# Patient Record
Sex: Male | Born: 1960 | Race: Black or African American | Hispanic: No | Marital: Married | Smoking: Former smoker
Health system: Southern US, Community
[De-identification: ages and names within clinical notes are randomized; demographics above are authoritative.]

## PROBLEM LIST (undated history)

## (undated) DIAGNOSIS — R011 Cardiac murmur, unspecified: Secondary | ICD-10-CM

## (undated) DIAGNOSIS — M1990 Unspecified osteoarthritis, unspecified site: Secondary | ICD-10-CM

---

## 1997-10-11 ENCOUNTER — Encounter: Admission: RE | Admit: 1997-10-11 | Discharge: 1997-10-11 | Payer: Self-pay | Admitting: *Deleted

## 2004-08-29 ENCOUNTER — Emergency Department (HOSPITAL_COMMUNITY): Admission: EM | Admit: 2004-08-29 | Discharge: 2004-08-29 | Payer: Self-pay | Admitting: Family Medicine

## 2014-06-14 ENCOUNTER — Ambulatory Visit (INDEPENDENT_AMBULATORY_CARE_PROVIDER_SITE_OTHER): Payer: BLUE CROSS/BLUE SHIELD | Admitting: Family Medicine

## 2014-06-14 ENCOUNTER — Ambulatory Visit (INDEPENDENT_AMBULATORY_CARE_PROVIDER_SITE_OTHER): Payer: BLUE CROSS/BLUE SHIELD

## 2014-06-14 VITALS — BP 140/98 | HR 70 | Temp 97.7°F | Resp 16 | Ht 72.0 in | Wt 219.6 lb

## 2014-06-14 DIAGNOSIS — M7061 Trochanteric bursitis, right hip: Secondary | ICD-10-CM

## 2014-06-14 DIAGNOSIS — M25551 Pain in right hip: Secondary | ICD-10-CM

## 2014-06-14 DIAGNOSIS — M25552 Pain in left hip: Secondary | ICD-10-CM

## 2014-06-14 DIAGNOSIS — M4125 Other idiopathic scoliosis, thoracolumbar region: Secondary | ICD-10-CM

## 2014-06-14 DIAGNOSIS — M5441 Lumbago with sciatica, right side: Secondary | ICD-10-CM

## 2014-06-14 MED ORDER — TRAMADOL HCL 50 MG PO TABS: 50.0000 mg | ORAL_TABLET | Freq: Three times a day (TID) | ORAL | Status: DC | PRN

## 2014-06-14 MED ORDER — METHYLPREDNISOLONE ACETATE 80 MG/ML IJ SUSP
40.0000 mg | Freq: Once | INTRAMUSCULAR | Status: AC
  Administered 2014-06-14: 40 mg via INTRAMUSCULAR

## 2014-06-14 MED ORDER — CYCLOBENZAPRINE HCL 10 MG PO TABS: 10.0000 mg | ORAL_TABLET | Freq: Two times a day (BID) | ORAL | Status: DC | PRN

## 2014-06-14 NOTE — Patient Instructions (Signed)
Use the flexeril and/ or tramadol for pain in your back.  The flexeril is a muscle relaxer and tramadol is a mild narcotic pain medication.  Avoid using both at the same time unless absolutely necessary for pain- remember that these medications can make you feel sleepy.   If you would like to go ahead with seeing a specialist for your back just let me know.

## 2014-06-14 NOTE — Progress Notes (Signed)
[facility] [address] [place] [address] [phone]  Date:  06/14/2014   Name:  Riley Wright   DOB:  [birth year]   MRN:  [id number]  PCP:  No PCP Per Patient    Chief Complaint: Back Pain   History of Present Illness:  Riley Wright is a 54 y.o. 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   He has a history of scoliosis.  Sometimes his back can be pretty stiff in the am.   When he was pushing the items onto his truck, he twisted his back and had sudden onset of pain.   He has noted some pain and numbness into his right leg coming and going- this is more troublesome when he is laying down in bed.   This has been present off and on for about 2 months. He does not note any weakness in his legs.   No trouble with bowel or bladder control.  No history of back surgery.    He has had x-rays but never an MRI that he can recall.   OTC he has tried some medications for back pain.   There are no active problems to display for this patient.   History reviewed. No pertinent past medical history.  History reviewed. No pertinent past surgical history.  History  Substance Use Topics  . Smoking status: Current Some Day Smoker  . Smokeless tobacco: Not on file  . Alcohol Use: 1.2 oz/week    2 Standard drinks or equivalent per week    Family History  Problem Relation Age of Onset  . Stroke Mother   . Hypertension Mother   . Hypertension Father     Not on File  Medication list has been reviewed and updated.  No current outpatient prescriptions on file prior to visit.   No current facility-administered medications on file prior to visit.    Review of Systems:  As per HPI- otherwise negative.   Physical Examination: Filed Vitals:   06/14/14 1429  BP: 150/108  Pulse: 70  Temp: 97.7 F (36.5 C)  Resp: 16    Filed Vitals:   06/14/14 1429  Height: 6' (1.829 m)  Weight: 219 lb 9.6 oz (99.61 kg)   Body mass index is 29.78 kg/(m^2). Ideal Body Weight: Weight in (lb) to have BMI = 25: 183.9  GEN: WDWN, NAD, Non-toxic, A & O x 3 HEENT: Atraumatic, Normocephalic. Neck supple. No masses, No LAD. Ears and Nose: No external deformity. CV: RRR, No M/G/R. No JVD. No thrill. No extra heart sounds. PULM: CTA B, no wheezes, crackles, rhonchi. No retractions. No resp. distress. No accessory muscle use. ABD: S, NT, ND, +BS. No rebound. No HSM. EXTR: No c/c/e NEURO Normal gait.  PSYCH: Normally interactive. Conversant. Not depressed or anxious appearing.  Calm demeanor. ;  [facility] reading (PRIMARY) by  Dr. Patsy Lager. Thoracic spine: significant thoracolumbar scoliosis Lumbar spine: significant thoracolumbar scoliosis, degenerative change  Right hip:   Mild degenerative change Left hip: mild degenerative    VC obtained,  Prepped area over right greater trochanter with betadine and alcohol.  Anesthesia with ethyl chloride spray.  Injected 3ml of 1% lidocaine and  of depo- medrol into the greater trochanteric bursa  Assessment and Plan: Other idiopathic scoliosis, thoracolumbar region - Plan: DG Lumbar Spine Complete, DG  Thoracic Spine 2 View  Bilateral hip pain - Plan: DG HIP UNILAT WITH PELVIS 1V RIGHT, DG HIP UNILAT WITH PELVIS 1V LEFT  Right-sided low back pain with right-sided sciatica - Plan: cyclobenzaprine (FLEXERIL) 10 MG tablet, traMADol (ULTRAM) 50 MG tablet  Trochanteric bursitis of right hip - Plan: methylPREDNISolone acetate (DEPO-MEDROL) injection 40 mg  He noted some relief of his right hip pain following his injection.  Given rx tramadol and flexeril to use as needed for pain- cautioned regarding sedation.  He will let me know if he wants to go ahead with seeing a spine specialist- he declines for now. He is thinking about taking up a different line of work that will be less stressful  on his back   Encouraged him to stay active and to let me know if his back is not feeling better son He will also keep an eye on his BP and follow-up regarding this soon.    Signed Abbe AmsterdamJessica Copland, MD

## 2014-06-18 ENCOUNTER — Telehealth: Payer: Self-pay | Admitting: Family Medicine

## 2014-06-18 ENCOUNTER — Telehealth: Payer: Self-pay

## 2014-06-18 DIAGNOSIS — M4125 Other idiopathic scoliosis, thoracolumbar region: Secondary | ICD-10-CM

## 2014-06-18 NOTE — Telephone Encounter (Signed)
Dr. Patsy Lageropland  Patient would a referral to a back specialist.    [phone]

## 2014-06-18 NOTE — Telephone Encounter (Signed)
Please let him know that this referral is made

## 2014-06-18 NOTE — Telephone Encounter (Signed)
Spoke with patient i let him know that Dr Patsy Lageropland made his referral and someone will give him a call later about it

## 2014-06-18 NOTE — Telephone Encounter (Signed)
Other idiopathic scoliosis, thoracolumbar region - Plan: DG Lumbar Spine Complete, DG Thoracic Spine 2 View  Bilateral hip pain - Plan: DG HIP UNILAT WITH PELVIS 1V RIGHT, DG HIP UNILAT WITH PELVIS 1V LEFT  Right-sided low back pain with right-sided sciatica - Plan: cyclobenzaprine (FLEXERIL) 10 MG tablet, traMADol (ULTRAM) 50 MG tablet  Trochanteric bursitis of right hip - Plan: methylPREDNISolone acetate (DEPO-MEDROL) injection 40 mg  He noted some relief of his right hip pain following his injection. Given rx tramadol and flexeril to use as needed for pain- cautioned regarding sedation. He will let me know if he wants to go ahead with seeing a spine specialist- he declines for now. He is thinking about taking up a different line of work that will be less stressful on his back  Encouraged him to stay active and to let me know if his back is not feeling better son He will also keep an eye on his BP and follow-up regarding this soon.   Can I place referral?

## 2014-07-20 ENCOUNTER — Telehealth: Payer: Self-pay | Admitting: Radiology

## 2014-07-20 NOTE — Telephone Encounter (Signed)
Neurosurgeon office called, patient declined appointment.  He stated he was seeing his W/C doctor.

## 2015-01-06 ENCOUNTER — Other Ambulatory Visit: Payer: Self-pay | Admitting: Surgical

## 2015-01-19 NOTE — Patient Instructions (Signed)
Riley Wright  01/19/2015   Your procedure is scheduled on: Wednesday 01/26/2015  Report to [facility]  Entrance take [facility]  elevators to 3rd floor to  Short Stay Center at  0930  AM.  Call this number if you have problems the morning of surgery [phone]   Remember: ONLY 1 PERSON MAY GO WITH YOU TO SHORT STAY TO GET  READY MORNING OF YOUR SURGERY.  Do not eat food or drink liquids :After Midnight.     Take these medicines the morning of surgery with A SIP OF WATER: TRAMADOL IF NEEDED  DO NOT TAKE ANY DIABETIC MEDICATIONS DAY OF YOUR SURGERY                               You may not have any metal on your body including hair pins and              piercings  Do not wear jewelry, make-up, lotions, powders or perfumes, deodorant             Do not wear nail polish.  Do not shave  48 hours prior to surgery.              Men may shave face and neck.   Do not bring valuables to the hospital. [place] IS NOT             RESPONSIBLE   FOR VALUABLES.  Contacts, dentures or bridgework may not be worn into surgery.  Leave suitcase in the car. After surgery it may be brought to your room.     Patients discharged the day of surgery will not be allowed to drive home.  Name and phone number of your driver:  Special Instructions: N/A              Please read over the following fact sheets you were given: _____________________________________________________________________             [facility] - Preparing for Surgery Before surgery, you can play an important role.  Because skin is not sterile, your skin needs to be as free of germs as possible.  You can reduce the number of germs on your skin by washing with CHG (chlorahexidine gluconate) soap before surgery.  CHG is an antiseptic cleaner which kills germs and bonds with the skin to continue killing germs even after washing. Please DO NOT use if you have an allergy to CHG or antibacterial soaps.  If your  skin becomes reddened/irritated stop using the CHG and inform your nurse when you arrive at Short Stay. Do not shave (including legs and underarms) for at least 48 hours prior to the first CHG shower.  You may shave your face/neck. Please follow these instructions carefully:  1.  Shower with CHG Soap the night before surgery and the  morning of Surgery.  2.  If you choose to wash your hair, wash your hair first as usual with your  normal  shampoo.  3.  After you shampoo, rinse your hair and body thoroughly to remove the  shampoo.                           4.  Use CHG as you would any other liquid soap.  You can apply chg directly  to the  skin and wash                       Gently with a scrungie or clean washcloth.  5.  Apply the CHG Soap to your body ONLY FROM THE NECK DOWN.   Do not use on face/ open                           Wound or open sores. Avoid contact with eyes, ears mouth and genitals (private parts).                       Wash face,  Genitals (private parts) with your normal soap.             6.  Wash thoroughly, paying special attention to the area where your surgery  will be performed.  7.  Thoroughly rinse your body with warm water from the neck down.  8.  DO NOT shower/wash with your normal soap after using and rinsing off  the CHG Soap.                9.  Pat yourself dry with a clean towel.            10.  Wear clean pajamas.            11.  Place clean sheets on your bed the night of your first shower and do not  sleep with pets. Day of Surgery : Do not apply any lotions/deodorants the morning of surgery.  Please wear clean clothes to the hospital/surgery center.  FAILURE TO FOLLOW THESE INSTRUCTIONS MAY RESULT IN THE CANCELLATION OF YOUR SURGERY PATIENT SIGNATURE_________________________________  NURSE SIGNATURE__________________________________  ________________________________________________________________________   Adam Phenix  An incentive spirometer  is a tool that can help keep your lungs clear and active. This tool measures how well you are filling your lungs with each breath. Taking long deep breaths may help reverse or decrease the chance of developing breathing (pulmonary) problems (especially infection) following:  A long period of time when you are unable to move or be active. BEFORE THE PROCEDURE   If the spirometer includes an indicator to show your best effort, your nurse or respiratory therapist will set it to a desired goal.  If possible, sit up straight or lean slightly forward. Try not to slouch.  Hold the incentive spirometer in an upright position. INSTRUCTIONS FOR USE   Sit on the edge of your bed if possible, or sit up as far as you can in bed or on a chair.  Hold the incentive spirometer in an upright position.  Breathe out normally.  Place the mouthpiece in your mouth and seal your lips tightly around it.  Breathe in slowly and as deeply as possible, raising the piston or the ball toward the top of the column.  Hold your breath for 3-5 seconds or for as long as possible. Allow the piston or ball to fall to the bottom of the column.  Remove the mouthpiece from your mouth and breathe out normally.  Rest for a few seconds and repeat Steps 1 through 7 at least 10 times every 1-2 hours when you are awake. Take your time and take a few normal breaths between deep breaths.  The spirometer may include an indicator to show your best effort. Use the indicator as a goal to work toward during each repetition.  After each set of  10 deep breaths, practice coughing to be sure your lungs are clear. If you have an incision (the cut made at the time of surgery), support your incision when coughing by placing a pillow or rolled up towels firmly against it. Once you are able to get out of bed, walk around indoors and cough well. You may stop using the incentive spirometer when instructed by your caregiver.  RISKS AND  COMPLICATIONS  Take your time so you do not get dizzy or light-headed.  If you are in pain, you may need to take or ask for pain medication before doing incentive spirometry. It is harder to take a deep breath if you are having pain. AFTER USE  Rest and breathe slowly and easily.  It can be helpful to keep track of a log of your progress. Your caregiver can provide you with a simple table to help with this. If you are using the spirometer at home, follow these instructions: [place] IF:   You are having difficultly using the spirometer.  You have trouble using the spirometer as often as instructed.  Your pain medication is not giving enough relief while using the spirometer.  You develop fever of 100.5 F (38.1 C) or higher. SEEK IMMEDIATE MEDICAL CARE IF:   You cough up bloody sputum that had not been present before.  You develop fever of 102 F (38.9 C) or greater.  You develop worsening pain at or near the incision site. MAKE SURE YOU:   Understand these instructions.  Will watch your condition.  Will get help right away if you are not doing well or get worse. Document Released: 08/13/2006 Document Revised: 06/25/2011 Document Reviewed: 10/14/2006 Exit[employer] Patient Information 2014 Exit[employer], [place].   ________________________________________________________________________  WHAT IS A BLOOD TRANSFUSION? Blood Transfusion Information  A transfusion is the replacement of blood or some of its parts. Blood is made up of multiple cells which provide different functions.  Red blood cells carry oxygen and are used for blood loss replacement.  White blood cells fight against infection.  Platelets control bleeding.  Plasma helps clot blood.  Other blood products are available for specialized needs, such as hemophilia or other clotting disorders. BEFORE THE TRANSFUSION  Who gives blood for transfusions?   Healthy volunteers who are fully evaluated to make sure  their blood is safe. This is blood bank blood. Transfusion therapy is the safest it has ever been in the practice of medicine. Before blood is taken from a donor, a complete history is taken to make sure that person has no history of diseases nor engages in risky social behavior (examples are intravenous drug use or sexual activity with multiple partners). The donor's travel history is screened to minimize risk of transmitting infections, such as malaria. The donated blood is tested for signs of infectious diseases, such as HIV and hepatitis. The blood is then tested to be sure it is compatible with you in order to minimize the chance of a transfusion reaction. If you or a relative donates blood, this is often done in anticipation of surgery and is not appropriate for emergency situations. It takes many days to process the donated blood. RISKS AND COMPLICATIONS Although transfusion therapy is very safe and saves many lives, the main dangers of transfusion include:   Getting an infectious disease.  Developing a transfusion reaction. This is an allergic reaction to something in the blood you were given. Every precaution is taken to prevent this. The decision to have a blood  transfusion has been considered carefully by your caregiver before blood is given. Blood is not given unless the benefits outweigh the risks. AFTER THE TRANSFUSION  Right after receiving a blood transfusion, you will usually feel much better and more energetic. This is especially true if your red blood cells have gotten low (anemic). The transfusion raises the level of the red blood cells which carry oxygen, and this usually causes an energy increase.  The nurse administering the transfusion will monitor you carefully for complications. HOME CARE INSTRUCTIONS  No special instructions are needed after a transfusion. You may find your energy is better. Speak with your caregiver about any limitations on activity for underlying diseases  you may have. SEEK MEDICAL CARE IF:   Your condition is not improving after your transfusion.  You develop redness or irritation at the intravenous (IV) site. SEEK IMMEDIATE MEDICAL CARE IF:  Any of the following symptoms occur over the next 12 hours:  Shaking chills.  You have a temperature by mouth above 102 F (38.9 C), not controlled by medicine.  Chest, back, or muscle pain.  People around you feel you are not acting correctly or are confused.  Shortness of breath or difficulty breathing.  Dizziness and fainting.  You get a rash or develop hives.  You have a decrease in urine output.  Your urine turns a dark color or changes to pink, red, or brown. Any of the following symptoms occur over the next 10 days:  You have a temperature by mouth above 102 F (38.9 C), not controlled by medicine.  Shortness of breath.  Weakness after normal activity.  The white part of the eye turns yellow (jaundice).  You have a decrease in the amount of urine or are urinating less often.  Your urine turns a dark color or changes to pink, red, or brown. Document Released: 03/30/2000 Document Revised: 06/25/2011 Document Reviewed: 11/17/2007 [facility] Patient Information 2014 [place], [place].  _______________________________________________________________________

## 2015-01-21 ENCOUNTER — Ambulatory Visit (HOSPITAL_COMMUNITY): Admission: RE | Admit: 2015-01-21 | Payer: Worker's Compensation | Source: Ambulatory Visit

## 2015-01-21 ENCOUNTER — Encounter (HOSPITAL_COMMUNITY)
Admission: RE | Admit: 2015-01-21 | Discharge: 2015-01-21 | Disposition: A | Discharge status: Home / Self Care | Payer: Worker's Compensation | Source: Ambulatory Visit | Attending: Orthopedic Surgery | Admitting: Orthopedic Surgery

## 2015-01-21 ENCOUNTER — Ambulatory Visit (HOSPITAL_COMMUNITY)
Admission: RE | Admit: 2015-01-21 | Discharge: 2015-01-21 | Disposition: A | Discharge status: Home / Self Care | Payer: Worker's Compensation | Source: Ambulatory Visit | Attending: Surgical | Admitting: Surgical

## 2015-01-21 ENCOUNTER — Encounter (HOSPITAL_COMMUNITY): Payer: Self-pay

## 2015-01-21 DIAGNOSIS — M4806 Spinal stenosis, lumbar region: Secondary | ICD-10-CM | POA: Diagnosis not present

## 2015-01-21 DIAGNOSIS — Z01818 Encounter for other preprocedural examination: Secondary | ICD-10-CM

## 2015-01-21 DIAGNOSIS — M545 Low back pain: Secondary | ICD-10-CM | POA: Diagnosis not present

## 2015-01-21 DIAGNOSIS — M4186 Other forms of scoliosis, lumbar region: Secondary | ICD-10-CM | POA: Insufficient documentation

## 2015-01-21 HISTORY — DX: Cardiac murmur, unspecified: R01.1

## 2015-01-21 HISTORY — DX: Unspecified osteoarthritis, unspecified site: M19.90

## 2015-01-21 LAB — COMPREHENSIVE METABOLIC PANEL
ALT: 52 U/L (ref 17–63)
AST: 30 U/L (ref 15–41)
Albumin: 4.7 g/dL (ref 3.5–5.0)
Alkaline Phosphatase: 43 U/L (ref 38–126)
Anion gap: 7 (ref 5–15)
BUN: 13 mg/dL (ref 6–20)
CO2: 27 mmol/L (ref 22–32)
Calcium: 9.7 mg/dL (ref 8.9–10.3)
Chloride: 105 mmol/L (ref 101–111)
Creatinine, Ser: 1.04 mg/dL (ref 0.61–1.24)
GFR calc Af Amer: >60 mL/min (ref >60)
GFR calc non Af Amer: >60 mL/min (ref >60)
Glucose, Bld: 101 mg/dL — ABNORMAL HIGH (ref 65–99)
Potassium: 4 mmol/L (ref 3.5–5.1)
Sodium: 139 mmol/L (ref 135–145)
Total Bilirubin: 1.3 mg/dL — ABNORMAL HIGH (ref 0.3–1.2)
Total Protein: 7.3 g/dL (ref 6.5–8.1)

## 2015-01-21 LAB — CBC WITH DIFFERENTIAL/PLATELET
Basophils Absolute: 0 10*3/uL (ref 0.0–0.1)
Basophils Relative: 0 %
Eosinophils Absolute: 0.2 10*3/uL (ref 0.0–0.7)
Eosinophils Relative: 3 %
HCT: 43.3 % (ref 39.0–52.0)
Hemoglobin: 14.4 g/dL (ref 13.0–17.0)
Lymphocytes Relative: 44 %
Lymphs Abs: 2.6 10*3/uL (ref 0.7–4.0)
MCH: 30.2 pg (ref 26.0–34.0)
MCHC: 33.3 g/dL (ref 30.0–36.0)
MCV: 90.8 fL (ref 78.0–100.0)
Monocytes Absolute: 0.4 10*3/uL (ref 0.1–1.0)
Monocytes Relative: 7 %
Neutro Abs: 2.6 10*3/uL (ref 1.7–7.7)
Neutrophils Relative %: 46 %
Platelets: 181 10*3/uL (ref 150–400)
RBC: 4.77 MIL/uL (ref 4.22–5.81)
RDW: 14.1 % (ref 11.5–15.5)
WBC: 5.8 10*3/uL (ref 4.0–10.5)

## 2015-01-21 LAB — SURGICAL PCR SCREEN
MRSA, PCR: NEGATIVE
STAPHYLOCOCCUS AUREUS: NEGATIVE

## 2015-01-21 LAB — PROTIME-INR
INR: 0.95 (ref 0.00–1.49)
Prothrombin Time: 12.9 seconds (ref 11.6–15.2)

## 2015-01-21 LAB — ABO/RH: ABO/RH(D): O POS

## 2015-01-24 NOTE — Progress Notes (Signed)
Patient notified of time change for surgery on Wednesday 01/26/2015. Patient instructed to arrive at Short Stay [facility] at 130 pm and can have clear liquids from midnight up until 0930 am morning of surgery and nothing after 0930  Until after surgery. Patient verbalized understanding.

## 2015-01-26 ENCOUNTER — Ambulatory Visit (HOSPITAL_COMMUNITY): Payer: Worker's Compensation | Admitting: Anesthesiology

## 2015-01-26 ENCOUNTER — Ambulatory Visit (HOSPITAL_COMMUNITY): Payer: Worker's Compensation

## 2015-01-26 ENCOUNTER — Ambulatory Visit (HOSPITAL_COMMUNITY)
Admission: RE | Admit: 2015-01-26 | Discharge: 2015-01-27 | Disposition: A | Discharge status: Home / Self Care | Payer: Worker's Compensation | Source: Ambulatory Visit | Attending: Orthopedic Surgery | Admitting: Orthopedic Surgery

## 2015-01-26 ENCOUNTER — Encounter (HOSPITAL_COMMUNITY): Payer: Self-pay | Admitting: *Deleted

## 2015-01-26 ENCOUNTER — Encounter (HOSPITAL_COMMUNITY)
Admission: RE | Disposition: A | Discharge status: Home / Self Care | Payer: Self-pay | Source: Ambulatory Visit | Attending: Orthopedic Surgery

## 2015-01-26 DIAGNOSIS — M13851 Other specified arthritis, right hip: Secondary | ICD-10-CM | POA: Diagnosis not present

## 2015-01-26 DIAGNOSIS — M48062 Spinal stenosis, lumbar region with neurogenic claudication: Secondary | ICD-10-CM | POA: Diagnosis present

## 2015-01-26 DIAGNOSIS — M13852 Other specified arthritis, left hip: Secondary | ICD-10-CM | POA: Diagnosis not present

## 2015-01-26 DIAGNOSIS — M4806 Spinal stenosis, lumbar region: Secondary | ICD-10-CM | POA: Insufficient documentation

## 2015-01-26 DIAGNOSIS — M5126 Other intervertebral disc displacement, lumbar region: Secondary | ICD-10-CM | POA: Insufficient documentation

## 2015-01-26 DIAGNOSIS — R011 Cardiac murmur, unspecified: Secondary | ICD-10-CM | POA: Insufficient documentation

## 2015-01-26 DIAGNOSIS — X58XXXS Exposure to other specified factors, sequela: Secondary | ICD-10-CM | POA: Insufficient documentation

## 2015-01-26 DIAGNOSIS — R531 Weakness: Secondary | ICD-10-CM | POA: Insufficient documentation

## 2015-01-26 DIAGNOSIS — Z419 Encounter for procedure for purposes other than remedying health state, unspecified: Secondary | ICD-10-CM

## 2015-01-26 DIAGNOSIS — F17210 Nicotine dependence, cigarettes, uncomplicated: Secondary | ICD-10-CM | POA: Insufficient documentation

## 2015-01-26 HISTORY — PX: LUMBAR LAMINECTOMY/DECOMPRESSION MICRODISCECTOMY: SHX5026

## 2015-01-26 LAB — TYPE AND SCREEN
ABO/RH(D): O POS
Antibody Screen: NEGATIVE

## 2015-01-26 SURGERY — LUMBAR LAMINECTOMY/DECOMPRESSION MICRODISCECTOMY 1 LEVEL
Anesthesia: General | Site: Back | Laterality: Right

## 2015-01-26 MED ORDER — ONDANSETRON HCL 4 MG/2ML IJ SOLN
INTRAMUSCULAR | Status: DC | PRN
  Administered 2015-01-26 (×2): 2 mg via INTRAVENOUS

## 2015-01-26 MED ORDER — BUPIVACAINE LIPOSOME 1.3 % IJ SUSP
20.0000 mL | Freq: Once | INTRAMUSCULAR | Status: AC
  Administered 2015-01-26: 20 mL
  Filled 2015-01-26: qty 20

## 2015-01-26 MED ORDER — METHOCARBAMOL 500 MG PO TABS
500.0000 mg | ORAL_TABLET | Freq: Four times a day (QID) | ORAL | Status: DC | PRN
  Administered 2015-01-27 (×2): 500 mg via ORAL
  Filled 2015-01-26 (×2): qty 1

## 2015-01-26 MED ORDER — LACTATED RINGERS IV SOLN
INTRAVENOUS | Status: DC | PRN
  Administered 2015-01-26 (×3): via INTRAVENOUS

## 2015-01-26 MED ORDER — HYDROMORPHONE HCL 1 MG/ML IJ SOLN: 0.5000 mg | INTRAMUSCULAR | Status: DC | PRN

## 2015-01-26 MED ORDER — CEFAZOLIN SODIUM-DEXTROSE 2-3 GM-% IV SOLR
INTRAVENOUS | Status: AC
  Filled 2015-01-26: qty 50

## 2015-01-26 MED ORDER — THROMBIN 5000 UNITS EX SOLR
CUTANEOUS | Status: DC | PRN
  Administered 2015-01-26: 17:00:00 via TOPICAL

## 2015-01-26 MED ORDER — GLYCOPYRROLATE 0.2 MG/ML IJ SOLN
INTRAMUSCULAR | Status: DC | PRN
  Administered 2015-01-26: .6 mg via INTRAVENOUS

## 2015-01-26 MED ORDER — BISACODYL 5 MG PO TBEC: 5.0000 mg | DELAYED_RELEASE_TABLET | Freq: Every day | ORAL | Status: DC | PRN

## 2015-01-26 MED ORDER — BUPIVACAINE-EPINEPHRINE (PF) 0.5% -1:200000 IJ SOLN
INTRAMUSCULAR | Status: AC
  Filled 2015-01-26: qty 30

## 2015-01-26 MED ORDER — EPHEDRINE SULFATE 50 MG/ML IJ SOLN
INTRAMUSCULAR | Status: DC | PRN
  Administered 2015-01-26: 5 mg via INTRAVENOUS
  Administered 2015-01-26: 10 mg via INTRAVENOUS

## 2015-01-26 MED ORDER — ONDANSETRON HCL 4 MG/2ML IJ SOLN: 4.0000 mg | INTRAMUSCULAR | Status: DC | PRN

## 2015-01-26 MED ORDER — CEFAZOLIN SODIUM-DEXTROSE 2-3 GM-% IV SOLR
2.0000 g | INTRAVENOUS | Status: AC
  Administered 2015-01-26: 2 g via INTRAVENOUS

## 2015-01-26 MED ORDER — BUPIVACAINE-EPINEPHRINE 0.5% -1:200000 IJ SOLN
INTRAMUSCULAR | Status: DC | PRN
  Administered 2015-01-26: 20 mL

## 2015-01-26 MED ORDER — LACTATED RINGERS IV SOLN: INTRAVENOUS | Status: DC

## 2015-01-26 MED ORDER — NEOSTIGMINE METHYLSULFATE 10 MG/10ML IV SOLN
INTRAVENOUS | Status: AC
  Filled 2015-01-26: qty 1

## 2015-01-26 MED ORDER — LIDOCAINE HCL (CARDIAC) 20 MG/ML IV SOLN
INTRAVENOUS | Status: DC | PRN
  Administered 2015-01-26: 75 mg via INTRAVENOUS

## 2015-01-26 MED ORDER — PROPOFOL 10 MG/ML IV BOLUS
INTRAVENOUS | Status: DC | PRN
  Administered 2015-01-26: 180 mg via INTRAVENOUS

## 2015-01-26 MED ORDER — POLYETHYLENE GLYCOL 3350 17 G PO PACK: 17.0000 g | PACK | Freq: Every day | ORAL | Status: DC | PRN

## 2015-01-26 MED ORDER — FLEET ENEMA 7-19 GM/118ML RE ENEM: 1.0000 | ENEMA | Freq: Once | RECTAL | Status: DC | PRN

## 2015-01-26 MED ORDER — METOCLOPRAMIDE HCL 5 MG/ML IJ SOLN
INTRAMUSCULAR | Status: DC | PRN
  Administered 2015-01-26: 5 mg via INTRAVENOUS

## 2015-01-26 MED ORDER — FENTANYL CITRATE (PF) 250 MCG/5ML IJ SOLN
INTRAMUSCULAR | Status: AC
  Filled 2015-01-26: qty 25

## 2015-01-26 MED ORDER — ACETAMINOPHEN 325 MG PO TABS: 650.0000 mg | ORAL_TABLET | ORAL | Status: DC | PRN

## 2015-01-26 MED ORDER — MIDAZOLAM HCL 5 MG/5ML IJ SOLN
INTRAMUSCULAR | Status: DC | PRN
  Administered 2015-01-26 (×2): 1 mg via INTRAVENOUS

## 2015-01-26 MED ORDER — DEXAMETHASONE SODIUM PHOSPHATE 10 MG/ML IJ SOLN
INTRAMUSCULAR | Status: DC | PRN
  Administered 2015-01-26: 10 mg via INTRAVENOUS

## 2015-01-26 MED ORDER — LIDOCAINE HCL (CARDIAC) 20 MG/ML IV SOLN
INTRAVENOUS | Status: AC
  Filled 2015-01-26: qty 5

## 2015-01-26 MED ORDER — MENTHOL 3 MG MT LOZG: 1.0000 | LOZENGE | OROMUCOSAL | Status: DC | PRN

## 2015-01-26 MED ORDER — DEXAMETHASONE SODIUM PHOSPHATE 10 MG/ML IJ SOLN
INTRAMUSCULAR | Status: AC
  Filled 2015-01-26: qty 1

## 2015-01-26 MED ORDER — NEOSTIGMINE METHYLSULFATE 10 MG/10ML IV SOLN
INTRAVENOUS | Status: DC | PRN
  Administered 2015-01-26: 5 mg via INTRAVENOUS

## 2015-01-26 MED ORDER — SUCCINYLCHOLINE CHLORIDE 20 MG/ML IJ SOLN
INTRAMUSCULAR | Status: DC | PRN
  Administered 2015-01-26: 140 mg via INTRAVENOUS

## 2015-01-26 MED ORDER — GLYCOPYRROLATE 0.2 MG/ML IJ SOLN
INTRAMUSCULAR | Status: AC
  Filled 2015-01-26: qty 3

## 2015-01-26 MED ORDER — HYDROMORPHONE HCL 1 MG/ML IJ SOLN: 0.2500 mg | INTRAMUSCULAR | Status: DC | PRN

## 2015-01-26 MED ORDER — PROMETHAZINE HCL 25 MG/ML IJ SOLN: 6.2500 mg | INTRAMUSCULAR | Status: DC | PRN

## 2015-01-26 MED ORDER — SODIUM CHLORIDE 0.9 % IR SOLN
Status: AC
  Filled 2015-01-26: qty 1

## 2015-01-26 MED ORDER — ACETAMINOPHEN 650 MG RE SUPP: 650.0000 mg | RECTAL | Status: DC | PRN

## 2015-01-26 MED ORDER — LACTATED RINGERS IV SOLN
INTRAVENOUS | Status: DC
  Administered 2015-01-26: 15:00:00 via INTRAVENOUS

## 2015-01-26 MED ORDER — THROMBIN 5000 UNITS EX SOLR
CUTANEOUS | Status: AC
  Filled 2015-01-26: qty 10000

## 2015-01-26 MED ORDER — FENTANYL CITRATE (PF) 100 MCG/2ML IJ SOLN
INTRAMUSCULAR | Status: DC | PRN
  Administered 2015-01-26 (×5): 50 ug via INTRAVENOUS

## 2015-01-26 MED ORDER — METHOCARBAMOL 1000 MG/10ML IJ SOLN
500.0000 mg | Freq: Four times a day (QID) | INTRAVENOUS | Status: DC | PRN
  Administered 2015-01-26: 500 mg via INTRAVENOUS
  Filled 2015-01-26 (×2): qty 5

## 2015-01-26 MED ORDER — POLYMYXIN B SULFATE 500000 UNITS IJ SOLR
INTRAMUSCULAR | Status: DC | PRN
  Administered 2015-01-26: 500 mL

## 2015-01-26 MED ORDER — SUGAMMADEX SODIUM 500 MG/5ML IV SOLN
INTRAVENOUS | Status: DC | PRN
  Administered 2015-01-26 (×2): 40 mg via INTRAVENOUS

## 2015-01-26 MED ORDER — ROCURONIUM BROMIDE 100 MG/10ML IV SOLN
INTRAVENOUS | Status: AC
  Filled 2015-01-26: qty 1

## 2015-01-26 MED ORDER — CEFAZOLIN SODIUM 1-5 GM-% IV SOLN
1.0000 g | Freq: Three times a day (TID) | INTRAVENOUS | Status: DC
  Administered 2015-01-26 – 2015-01-27 (×2): 1 g via INTRAVENOUS
  Filled 2015-01-26 (×3): qty 50

## 2015-01-26 MED ORDER — HYDROCODONE-ACETAMINOPHEN 5-325 MG PO TABS
1.0000 | ORAL_TABLET | ORAL | Status: DC | PRN
  Administered 2015-01-27: 1 via ORAL
  Filled 2015-01-26: qty 1

## 2015-01-26 MED ORDER — ROCURONIUM BROMIDE 100 MG/10ML IV SOLN
INTRAVENOUS | Status: DC | PRN
  Administered 2015-01-26 (×2): 5 mg via INTRAVENOUS
  Administered 2015-01-26: 40 mg via INTRAVENOUS

## 2015-01-26 MED ORDER — EPHEDRINE SULFATE 50 MG/ML IJ SOLN
INTRAMUSCULAR | Status: AC
  Filled 2015-01-26: qty 1

## 2015-01-26 MED ORDER — PHENOL 1.4 % MT LIQD
1.0000 | OROMUCOSAL | Status: DC | PRN
Start: 2015-01-26 — End: 2015-01-27
  Filled 2015-01-26: qty 177

## 2015-01-26 MED ORDER — LACTATED RINGERS IV SOLN
INTRAVENOUS | Status: DC
  Administered 2015-01-27: 30 mL/h via INTRAVENOUS

## 2015-01-26 MED ORDER — SODIUM CHLORIDE 0.9 % IJ SOLN
INTRAMUSCULAR | Status: AC
  Filled 2015-01-26: qty 10

## 2015-01-26 MED ORDER — ONDANSETRON HCL 4 MG/2ML IJ SOLN
INTRAMUSCULAR | Status: AC
  Filled 2015-01-26: qty 2

## 2015-01-26 MED ORDER — MIDAZOLAM HCL 2 MG/2ML IJ SOLN
INTRAMUSCULAR | Status: AC
  Filled 2015-01-26: qty 4

## 2015-01-26 MED ORDER — OXYCODONE-ACETAMINOPHEN 5-325 MG PO TABS
1.0000 | ORAL_TABLET | ORAL | Status: DC | PRN
  Administered 2015-01-27: 1 via ORAL
  Filled 2015-01-26: qty 1

## 2015-01-26 MED ORDER — BACITRACIN-NEOMYCIN-POLYMYXIN 400-5-5000 EX OINT
TOPICAL_OINTMENT | CUTANEOUS | Status: AC
  Filled 2015-01-26: qty 1

## 2015-01-26 MED ORDER — CHLORHEXIDINE GLUCONATE 4 % EX LIQD: 60.0000 mL | Freq: Once | CUTANEOUS | Status: DC

## 2015-01-26 MED ORDER — PROPOFOL 10 MG/ML IV BOLUS
INTRAVENOUS | Status: AC
  Filled 2015-01-26: qty 20

## 2015-01-26 SURGICAL SUPPLY — 39 items
BAG SPEC THK2 15X12 ZIP CLS (MISCELLANEOUS) ×1
BAG ZIPLOCK 12X15 (MISCELLANEOUS) ×3 IMPLANT
CLEANER TIP ELECTROSURG 2X2 (MISCELLANEOUS) ×3 IMPLANT
DRAPE MICROSCOPE LEICA (MISCELLANEOUS) ×3 IMPLANT
DRAPE POUCH INSTRU U-SHP 10X18 (DRAPES) ×3 IMPLANT
DRAPE SHEET LG 3/4 BI-LAMINATE (DRAPES) ×3 IMPLANT
DRAPE SURG 17X11 SM STRL (DRAPES) ×3 IMPLANT
DRSG ADAPTIC 3X8 NADH LF (GAUZE/BANDAGES/DRESSINGS) ×3 IMPLANT
DRSG PAD ABDOMINAL 8X10 ST (GAUZE/BANDAGES/DRESSINGS) ×6 IMPLANT
DURAPREP 26ML APPLICATOR (WOUND CARE) ×3 IMPLANT
ELECT BLADE TIP CTD 4 INCH (ELECTRODE) ×3 IMPLANT
ELECT REM PT RETURN 9FT ADLT (ELECTROSURGICAL) ×3
ELECTRODE REM PT RTRN 9FT ADLT (ELECTROSURGICAL) ×1 IMPLANT
GAUZE SPONGE 4X4 12PLY STRL (GAUZE/BANDAGES/DRESSINGS) ×3 IMPLANT
GLOVE BIOGEL PI IND STRL 8 (GLOVE) ×1 IMPLANT
GLOVE BIOGEL PI INDICATOR 8 (GLOVE) ×2
GLOVE ECLIPSE 8.0 STRL XLNG CF (GLOVE) ×3 IMPLANT
GOWN STRL REUS W/TWL XL LVL3 (GOWN DISPOSABLE) ×6 IMPLANT
KIT BASIN OR (CUSTOM PROCEDURE TRAY) ×3 IMPLANT
KIT POSITIONING SURG ANDREWS (MISCELLANEOUS) ×3 IMPLANT
MANIFOLD NEPTUNE II (INSTRUMENTS) ×3 IMPLANT
NDL SPNL 18GX3.5 QUINCKE PK (NEEDLE) ×2 IMPLANT
NEEDLE HYPO 22GX1.5 SAFETY (NEEDLE) ×5 IMPLANT
NEEDLE SPNL 18GX3.5 QUINCKE PK (NEEDLE) ×6 IMPLANT
PACK LAMINECTOMY ORTHO (CUSTOM PROCEDURE TRAY) ×3 IMPLANT
PATTIES SURGICAL .5 X.5 (GAUZE/BANDAGES/DRESSINGS) ×3 IMPLANT
PATTIES SURGICAL .75X.75 (GAUZE/BANDAGES/DRESSINGS) IMPLANT
PATTIES SURGICAL 1X1 (DISPOSABLE) IMPLANT
PEN SKIN MARKING BROAD (MISCELLANEOUS) ×3 IMPLANT
RUBBERBAND STERILE (MISCELLANEOUS) ×2 IMPLANT
SPONGE LAP 4X18 X RAY DECT (DISPOSABLE) IMPLANT
SPONGE SURGIFOAM ABS GEL 100 (HEMOSTASIS) ×3 IMPLANT
STAPLER VISISTAT 35W (STAPLE) ×3 IMPLANT
SUT VIC AB 0 CT1 27 (SUTURE) ×3
SUT VIC AB 0 CT1 27XBRD ANTBC (SUTURE) ×1 IMPLANT
SUT VIC AB 1 CT1 27 (SUTURE) ×9
SUT VIC AB 1 CT1 27XBRD ANTBC (SUTURE) ×3 IMPLANT
SYR 20CC LL (SYRINGE) ×3 IMPLANT
TOWEL OR 17X26 10 PK STRL BLUE (TOWEL DISPOSABLE) ×3 IMPLANT

## 2015-01-26 NOTE — Transfer of Care (Signed)
Immediate Anesthesia Transfer of Care Note  Patient: Riley GeorgesBilly Snell  Procedure(s) Performed: Procedure(s): LUMBAR DECOMPRESSION L4-5 CENTRAL AND TO THE RIGHT (Right)  Patient Location: PACU  Anesthesia Type:General  Level of Consciousness: awake, alert , oriented, patient cooperative and responds to stimulation  Airway & Oxygen Therapy: Patient Spontanous Breathing and Patient connected to face mask oxygen  Post-op Assessment: Report given to RN, Post -op Vital signs reviewed and stable and Patient moving all extremities  Post vital signs: Reviewed and stable  Last Vitals:  Filed Vitals:   01/26/15 1357  BP: 148/98  Pulse:   Temp:   Resp:     Complications: No apparent anesthesia complications

## 2015-01-26 NOTE — Anesthesia Procedure Notes (Signed)
Procedure Name: Intubation Date/Time: 01/26/2015 3:30 PM Performed by: Edison PaceGRAY, Dorris Pierre E Pre-anesthesia Checklist: Patient identified, Emergency Drugs available, Suction available, Patient being monitored and Timeout performed Patient Re-evaluated:Patient Re-evaluated prior to inductionOxygen Delivery Method: Circle system utilized Preoxygenation: Pre-oxygenation with 100% oxygen Intubation Type: IV induction Ventilation: Mask ventilation without difficulty Laryngoscope Size: Mac and 4 Grade View: Grade II Tube type: Oral Tube size: 8.0 mm Number of attempts: 1 Airway Equipment and Method: Stylet Placement Confirmation: ETT inserted through vocal cords under direct vision,  positive ETCO2 and breath sounds checked- equal and bilateral Secured at: 21 cm Tube secured with: Tape Dental Injury: Teeth and Oropharynx as per pre-operative assessment

## 2015-01-26 NOTE — Anesthesia Preprocedure Evaluation (Signed)
Anesthesia Evaluation  Patient identified by MRN, date of birth, ID band Patient awake    Reviewed: Allergy & Precautions, NPO status , Patient's Chart, lab work & pertinent test results  Airway Mallampati: II  TM Distance: >3 FB Neck ROM: Full    Dental  (+) Teeth Intact, Dental Advisory Given   Pulmonary Current Smoker,    Pulmonary exam normal breath sounds clear to auscultation       Cardiovascular Exercise Tolerance: Good negative cardio ROS Normal cardiovascular exam Rhythm:Regular Rate:Normal     Neuro/Psych negative neurological ROS  negative psych ROS   GI/Hepatic negative GI ROS, Neg liver ROS,   Endo/Other  negative endocrine ROS  Renal/GU negative Renal ROS     Musculoskeletal  (+) Arthritis , Osteoarthritis,    Abdominal   Peds  Hematology negative hematology ROS (+)   Anesthesia Other Findings Day of surgery medications reviewed with the patient.  Reproductive/Obstetrics                             Anesthesia Physical Anesthesia Plan  ASA: II  Anesthesia Plan: General   Post-op Pain Management:    Induction: Intravenous  Airway Management Planned: Oral ETT  Additional Equipment:   Intra-op Plan:   Post-operative Plan: Extubation in OR  Informed Consent: I have reviewed the patients History and Physical, chart, labs and discussed the procedure including the risks, benefits and alternatives for the proposed anesthesia with the patient or authorized representative who has indicated his/her understanding and acceptance.   Dental advisory given  Plan Discussed with: CRNA  Anesthesia Plan Comments: (Risks/benefits of general anesthesia discussed with patient including risk of damage to teeth, lips, gum, and tongue, nausea/vomiting, allergic reactions to medications, and the possibility of heart attack, stroke and death.  All patient questions answered.  Patient  wishes to proceed.)        Anesthesia Quick Evaluation

## 2015-01-26 NOTE — Interval H&P Note (Signed)
History and Physical Interval Note:  01/26/2015 3:30 PM  Riley Wright  has presented today for surgery, with the diagnosis of LUMBER STENOSIS  The various methods of treatment have been discussed with the patient and family. After consideration of risks, benefits and other options for treatment, the patient has consented to  Procedure(s): LUMBAR DECOMPRESSION L4-5 CENTRAL AND TO THE RIGHT (Right) as a surgical intervention .  The patient's history has been reviewed, patient examined, no change in status, stable for surgery.  I have reviewed the patient's chart and labs.  Questions were answered to the patient's satisfaction.     [name] A

## 2015-01-26 NOTE — Progress Notes (Signed)
Informed Dr. Desmond Lopeurk pt drank 12 oz of pepsi at 12 noon.  No new orders given.

## 2015-01-26 NOTE — Brief Op Note (Signed)
01/26/2015  5:20 PM  PATIENT:  Riley Wright  54 y.o. 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  POST-OPERATIVE DIAGNOSIS:  LUMBER Spinal STENOSIS and Foraminal Stenosis at Two levels  PROCEDURE:  Procedure(s): LUMBAR DECOMPRESSION L4-5 CENTRAL AND TO THE RIGHT (Right) for Spinal Stenosis and Foraminotomies for L-4 and L-5,Bilaterally for Foraminal Stenosis  SURGEON:  Surgeon(s) and Role:    * Drucilla SchmidtJames P Aplington, MD - Assisting    * Ranee Gosselinonald Lylla Eifler, MD - Primary     ASSISTANTS: Marlowe KaysJames Aplington MD   ANESTHESIA:   general  EBL:  Total I/O In: 1000 [I.V.:1000] Out: 50 [Blood:50]  BLOOD ADMINISTERED:none  DRAINS: none   LOCAL MEDICATIONS USED:  MARCAINE  20cc of 0.50% with Epinephrine at start of case and 20cc of Exparel at the end of the case.   SPECIMEN:  No Specimen  DISPOSITION OF SPECIMEN:  N/A  COUNTS:  YES  TOURNIQUET:  * No tourniquets in log *  DICTATION: .Other Dictation: Dictation Number [id number]  PLAN OF CARE: Admit for overnight observation  PATIENT DISPOSITION:  Stable in OR   Delay start of Pharmacological VTE agent (>24hrs) due to surgical blood loss or risk of bleeding: yes

## 2015-01-26 NOTE — H&P (Signed)
Riley Wright is an 54 y.o. male.   Chief Complaint: Pain in right leg HPI: Injured his back at work.  Past Medical History  Diagnosis Date  . Heart murmur   . Arthritis     bil hips    History reviewed. No pertinent past surgical history.  Family History  Problem Relation Age of Onset  . Stroke Mother   . Hypertension Mother   . Hypertension Father    Social History:  reports that he has been smoking.  He does not have any smokeless tobacco history on file. He reports that he drinks about 1.2 oz of alcohol per week. He reports that he does not use illicit drugs.  Allergies: No Known Allergies  Medications Prior to Admission  Medication Sig Dispense Refill  . naproxen sodium (ANAPROX) 220 MG tablet Take 220 mg by mouth 2 (two) times daily as needed (for pain).    . cyclobenzaprine (FLEXERIL) 10 MG tablet Take 1 tablet (10 mg total) by mouth 2 (two) times daily as needed for muscle spasms. (Patient not taking: Reported on 01/13/2015) 30 tablet 0  . traMADol (ULTRAM) 50 MG tablet Take 1 tablet (50 mg total) by mouth every 8 (eight) hours as needed. (Patient not taking: Reported on 01/13/2015) 30 tablet 0    No results found for this or any previous visit (from the past 48 hour(s)). No results found.  Review of Systems  Constitutional: Negative.   HENT: Negative.   Eyes: Negative.   Cardiovascular: Negative.   Genitourinary: Negative.   Musculoskeletal: Positive for back pain.  Skin: Negative.   Neurological: Positive for focal weakness.  Endo/Heme/Allergies: Negative.   Psychiatric/Behavioral: Negative.     Blood pressure 148/98, pulse 82, temperature 98.4 F (36.9 C), temperature source Oral, resp. rate 18, height 6\' 1"  (1.854 m), weight 102.598 kg (226 lb 3 oz), SpO2 100 %. Physical Exam  Constitutional: He appears well-developed.  HENT:  Head: Normocephalic.  Eyes: Pupils are equal, round, and reactive to light.  Neck: Normal range of motion.  Cardiovascular: Normal  rate.   Respiratory: Effort normal.  GI: Soft.  Musculoskeletal:  WEakness of his Dorsiflexors of Right Foot.  Neurological:  Weakness of his dorsiflexors of his right foot.  Skin: Skin is warm.     Assessment/Plan Decompressive Lumbar Laminectomy and Microdiscectomy at L-4-L-5 on the right  Crystallee Werden A 01/26/2015, 3:25 PM

## 2015-01-26 NOTE — Anesthesia Postprocedure Evaluation (Signed)
  Anesthesia Post-op Note  Patient: Riley Wright  Procedure(s) Performed: Procedure(s) (LRB): LUMBAR DECOMPRESSION L4-5 CENTRAL AND TO THE RIGHT (Right)  Patient Location: PACU  Anesthesia Type: General  Level of Consciousness: awake and alert   Airway and Oxygen Therapy: Patient Spontanous Breathing  Post-op Pain: mild  Post-op Assessment: Post-op Vital signs reviewed, Patient's Cardiovascular Status Stable, Respiratory Function Stable, Patent Airway and No signs of Nausea or vomiting  Last Vitals:  Filed Vitals:   01/26/15 1834  BP: 142/83  Pulse: 69  Temp: 36.8 C  Resp: 14    Post-op Vital Signs: stable   Complications: No apparent anesthesia complications

## 2015-01-27 ENCOUNTER — Encounter (HOSPITAL_COMMUNITY): Payer: Self-pay | Admitting: Orthopedic Surgery

## 2015-01-27 DIAGNOSIS — M4806 Spinal stenosis, lumbar region: Secondary | ICD-10-CM | POA: Diagnosis not present

## 2015-01-27 MED ORDER — METHOCARBAMOL 500 MG PO TABS: 500.0000 mg | ORAL_TABLET | Freq: Four times a day (QID) | ORAL | Status: DC | PRN

## 2015-01-27 MED ORDER — OXYCODONE-ACETAMINOPHEN 5-325 MG PO TABS: 1.0000 | ORAL_TABLET | ORAL | Status: DC | PRN

## 2015-01-27 NOTE — Progress Notes (Signed)
Subjective: 1 Day Post-Op Procedure(s) (LRB): LUMBAR DECOMPRESSION L4-5 CENTRAL AND TO THE RIGHT (Right) Patient reports pain as 2 on 0-10 scale. Doing very well today.   Objective: Vital signs in last 24 hours: Temp:  [97.8 F (36.6 C)-99 F (37.2 C)] 97.9 F (36.6 C) (10/13 0627) Pulse Rate:  [60-82] 60 (10/13 0627) Resp:  [11-18] 15 (10/13 0627) BP: (128-156)/(74-107) 136/75 mmHg (10/13 0627) SpO2:  [97 %-100 %] 97 % (10/13 0627) Weight:  [102.598 kg (226 lb 3 oz)] 102.598 kg (226 lb 3 oz) (10/12 1417)  Intake/Output from previous day: 10/12 0701 - 10/13 0700 In: 3160 [P.O.:660; I.V.:2500] Out: 1775 [Urine:1725; Blood:50] Intake/Output this shift:    No results for input(s): HGB in the last 72 hours. No results for input(s): WBC, RBC, HCT, PLT in the last 72 hours. No results for input(s): NA, K, CL, CO2, BUN, CREATININE, GLUCOSE, CALCIUM in the last 72 hours. No results for input(s): LABPT, INR in the last 72 hours.  Neurologically intact Dorsiflexion/Plantar flexion intact  Assessment/Plan: 1 Day Post-Op Procedure(s) (LRB): LUMBAR DECOMPRESSION L4-5 CENTRAL AND TO THE RIGHT (Right) Up with therapy Discharge home with home health  Jamine Wingate A 01/27/2015, 7:15 AM

## 2015-01-27 NOTE — Op Note (Signed)
NAMEMILFERD, ANSELL NO.:  [id number]  MEDICAL RECORD NO.:  [id number]  LOCATION:  1616                         FACILITY:  [facility]  PHYSICIAN:  Georges Lynch. Aniston Christman, M.D.DATE OF BIRTH:  Dec 16, 1960  DATE OF PROCEDURE:  01/26/2015 DATE OF DISCHARGE:                              OPERATIVE REPORT   SURGEON:  Georges Lynch. Darrelyn Hillock, MD  ASSISTANT:  Marlowe Kays, MD  PREOPERATIVE DIAGNOSES: 1. Traumatic spinal stenosis secondary to on-the-job injury. 2. Foraminal stenosis of the L4 and the L5 roots bilaterally. 3. Bulging disk L4-5 on the right. 4. Weakness of the dorsiflexors, right foot.  POSTOPERATIVE DIAGNOSES: 1. Traumatic spinal stenosis secondary to on-the-job injury. 2. Foraminal stenosis of the L4 and the L5 roots bilaterally. 3. Bulging disk L4-5 on the right. 4. Weakness of the dorsiflexors, right foot.  OPERATION: 1. Central decompressive lumbar laminectomy at L4-5. 2. Decompression of the lateral recesses at 4-5 bilaterally. 3. Foraminotomies for the L3 and the L4 roots bilaterally, at 2     levels. 4. Exploration of the disk space on the right at L4-5.  DESCRIPTION OF PROCEDURE:  Under general anesthesia, the patient on a spinal frame with routine orthopedic prep and draping of the low back was carried out.  At this time, appropriate time-out was carried out.  I also marked the appropriate right leg in the holding area, since that problem was initially.  At this particular time, after the prep and time out, and I did mark the appropriate right side of his back in the holding area.  Two needles then were placed in the back for localization purposes with the patient on a spinal frame.  X-ray was taken.  Incision then was made over the L4-5 space.  It was extended distally and proximally in the usual fashion.  I then stripped the muscle from the lamina and spinous process bilaterally.  I then placed a Kocher clamp on the spinous process and x-ray was  taken to verify our position.  We then went down and noted that at this point that he did have a scoliosis.  I kept in mind during the entire procedure.  We then removed the lamina of L4 at this time, I then went down centrally and did a decompression of L4-L5.  Note, the ligamentum flavum was extremely thick.  We utilized that when we brought in the microscope or utilized ligamentum flavum to protect the dura as we went out and decompressed the lateral recesses. At this point, we went proximally and distally until we had complete freedom in the decompression.  We then protected the dura went out laterally on both sides and thoroughly decompressed the recesses especially the right side where he was more symptomatic.  The ligamentum flavum was extremely thick.  We gently peeled the ligamentum flavum off the lateral recess.  We identified the L5 root tear on the right and there was a slight bulge in the disk, but we felt that the bulging disk plus stenosis caused the overall symptoms on the right.  We did not feel that a diskectomy was necessary.  We then continued to follow out the 5 root out the foramen,  did a nice foraminotomy as well.  The foraminotomies were completed bilaterally for the L4 and L5 roots.  Once the procedure was done, we were able to easily move the nerve root and dura without a problem.  We noted that when we were dissecting the foramen and the recess at L4-5 on the right, the nerve was extremely sensitive to touch and it was extremely swollen.  Note, this patient was highlight this asymptomatic with this leg prior to the injury.  We thoroughly irrigated out the area.  Good hemostasis was maintained.  I then loosely applied some thrombin-soaked Gelfoam and closed the wound in layers in usual fashion.  We did leave a small distal deep and proximal part of the wound open for drainage purposes.  We injected 20 mL of Exparel at the end of the procedure.  At the beginning  of procedure, we injected 20 mL of 0.5% Marcaine and epinephrine into the soft tissues and controlled bleeding.  Sterile Neosporin dressings were applied.  He also had 2 g of IV Ancef at this time prior to surgery.          ______________________________ Georges Lynchonald A. Darrelyn HillockGioffre, M.D.     RAG/MEDQ  D:  01/26/2015  T:  01/27/2015  Job:  [id number]

## 2015-01-27 NOTE — Evaluation (Addendum)
Physical Therapy Evaluation Patient Details Name: Riley Wright MRN: [id number] DOB: October 17, 1960 Today's Date: 01/27/2015   History of Present Illness  54 yo male s/p central decompression, laminectomy L4-L5 01/26/15.   Clinical Impression  On eval, pt was Min guard assist for ambulation distance of ~150 feet with RW and Min assist for stair negotiation. Pain rated 6/10 with activity. Reviewed car transfer. All education completed. Ready to d/c from PT standpoint.     Follow Up Recommendations No PT follow up; 24 hour supervision/assist    Equipment Recommendations  Rolling walker with 5" wheels    Recommendations for Other Services OT consult     Precautions / Restrictions Precautions Precautions: Back;Fall Precaution Comments: Reviewed back precautions during session Restrictions Weight Bearing Restrictions: No      Mobility  Bed Mobility Overal bed mobility: Needs Assistance Bed Mobility: Rolling;Sidelying to Sit Rolling: Supervision Sidelying to sit: Supervision       General bed mobility comments: oob in recliner  Transfers Overall transfer level: Needs assistance Equipment used: Rolling walker (2 wheeled) Transfers: Sit to/from Stand Sit to Stand: Min guard         General transfer comment: close guard for safety. slightly unsteady. Increased time.   Ambulation/Gait Ambulation/Gait assistance: Min guard Ambulation Distance (Feet): 150 Feet Assistive device: Rolling walker (2 wheeled)       General Gait Details: VCs posture.   Stairs Stairs: Yes Stairs assistance: Min assist Stair Management: Forwards;Step to pattern Number of Stairs: 2 General stair comments: VCS safety, technique. 1 HHA given for support.   Wheelchair Mobility    Modified Rankin (Stroke Patients Only)       Balance                                             Pertinent Vitals/Pain Pain Assessment: 0-10 Pain Score: 6  Pain Location: back Pain  Descriptors / Indicators: Aching;Sore Pain Intervention(s): Monitored during session;Repositioned    Home Living Family/patient expects to be discharged to:: Private residence Living Arrangements: Children;Spouse/significant other             Home Equipment: None      Prior Function Level of Independence: Independent               Hand Dominance        Extremity/Trunk Assessment   Upper Extremity Assessment: Defer to OT evaluation           Lower Extremity Assessment: Generalized weakness (post op)      Cervical / Trunk Assessment: Normal  Communication   Communication: No difficulties  Cognition Arousal/Alertness: Awake/alert Behavior During Therapy: WFL for tasks assessed/performed Overall Cognitive Status: Within Functional Limits for tasks assessed                      General Comments      Exercises        Assessment/Plan    PT Assessment Patent does not need any further PT services  PT Diagnosis Difficulty walking;Acute pain   PT Problem List    PT Treatment Interventions     PT Goals (Current goals can be found in the Care Plan section) Acute Rehab PT Goals Patient Stated Goal: get back healed PT Goal Formulation: All assessment and education complete, DC therapy    Frequency     Barriers to discharge  Co-evaluation               End of Session   Activity Tolerance: Patient tolerated treatment well Patient left: in chair;with call bell/phone within reach;with family/visitor present      Functional Assessment Tool Used: clinical judgement Functional Limitation: Mobility: Walking and moving around Mobility: Walking and Moving Around Current Status (W2956): At least 1 percent but less than 20 percent impaired, limited or restricted Mobility: Walking and Moving Around Goal Status [phone]): At least 1 percent but less than 20 percent impaired, limited or restricted Mobility: Walking and Moving Around Discharge  Status [phone]): At least 1 percent but less than 20 percent impaired, limited or restricted    Time: 6962-9528 PT Time Calculation (min) (ACUTE ONLY): 10 min   Charges:   PT Evaluation $Initial PT Evaluation Tier I: 1 Procedure     PT G Codes:   PT G-Codes **NOT FOR INPATIENT CLASS** Functional Assessment Tool Used: clinical judgement Functional Limitation: Mobility: Walking and moving around Mobility: Walking and Moving Around Current Status (U1324): At least 1 percent but less than 20 percent impaired, limited or restricted Mobility: Walking and Moving Around Goal Status [phone]): At least 1 percent but less than 20 percent impaired, limited or restricted Mobility: Walking and Moving Around Discharge Status [phone]): At least 1 percent but less than 20 percent impaired, limited or restricted    Rebeca Alert, MPT Pager: [phone]

## 2015-01-27 NOTE — Care Management Note (Signed)
Case Management Note  Patient Details  Name: Riley GeorgesBilly Derick MRN: [id number] Date of Birth: May 15, 1960  Subjective/Objective:                   LUMBAR DECOMPRESSION L4-5 CENTRAL AND TO THE RIGHT (Right) Action/Plan: Discharge planning  Expected Discharge Date:  01/27/15               Expected Discharge Plan:     In-House Referral:     Discharge planning Services  CM Consult  Post Acute Care Choice:  NA Choice offered to:  NA  DME Arranged:  3-N-1, Tub bench, Walker rolling DME Agency:     HH Arranged:    HH Agency:     Status of Service:  Completed, signed off  Medicare Important Message Given:    Date Medicare IM Given:    Medicare IM give by:    Date Additional Medicare IM Given:    Additional Medicare Important Message give by:     If discussed at Long Length of Stay Meetings, dates discussed:    Additional Comments: CM made multiple calls to [employer] [phone] and left voicemails x2 for Boykin PeekRyan Anderson with claim number as pt identifyer [id number] (per [employer]).  CM notified family and family opted to not wait and requested DME be shipped to home.  Boykin Peekyan Anderson called back and requested name and number of [employer] and requested  I fax facesheet, H&P, OP note, PT/OT Evals, orders to [phone].  CM faxed requested information along with [employer] [phone] and received confirmaiton of receipt.  Alycia RossettiRyan states he will arrange for [employer] to ship DME to home.  No other CM needs were communicated. Yves DillJeffries, Shad Ledvina Christine, RN 01/27/2015, 3:50 PM

## 2015-01-27 NOTE — Discharge Instructions (Signed)
For the first three days, remove your dressing, tape a piece of saran wrap over your incision °Take your shower, then remove the saran wrap and put a clean dressing on. °After three days you can shower without the saran wrap.  °No driving while taking pain medications °No lifting °Take aspirin 325mg daily to prevent blood clots °Call Dr. Gioffre if any wound complications or temperature of 101 degrees F or over.  °Call the office for an appointment to see Dr. Gioffre in two weeks: [phone] and ask for Dr. Gioffre's nurse, Tammy Johnson.  °

## 2015-01-27 NOTE — Evaluation (Addendum)
Occupational Therapy Evaluation Patient Details Name: Riley Wright MRN: [id number] DOB: 29-Apr-1960 Today's Date: 01/27/2015    History of Present Illness s/p L4-5 decompression   Clinical Impression   This 54 year old man was admitted for the above surgery. All education was completed.  No further OT is needed at this time.      Follow Up Recommendations  No OT follow up;Supervision/Assistance - 24 hour    Equipment Recommendations  3 in 1 bedside comode  Tub transfer bench   Recommendations for Other Services       Precautions / Restrictions Precautions Precautions: Back;Fall Restrictions Weight Bearing Restrictions: No      Mobility Bed Mobility Overal bed mobility: Needs Assistance Bed Mobility: Rolling;Sidelying to Sit Rolling: Supervision Sidelying to sit: Supervision       General bed mobility comments: cues for back precautions  Transfers Overall transfer level: Needs assistance Equipment used: Rolling walker (2 wheeled) Transfers: Sit to/from Stand Sit to Stand: Min guard         General transfer comment: cues for technique; min guard for safety    Balance                                            ADL Overall ADL's : Needs assistance/impaired     Grooming: Set up;Supervision/safety;Sitting   Upper Body Bathing: Min guard;Sitting   Lower Body Bathing: Minimal assistance;Sit to/from stand;With adaptive equipment   Upper Body Dressing : Set up;Supervision/safety;Sitting   Lower Body Dressing: Moderate assistance;With adaptive equipment;Sit to/from stand   Toilet Transfer: Minimal assistance;Ambulation;BSC;RW   Toileting- Clothing Manipulation and Hygiene: Sit to/from stand;Min guard         General ADL Comments: educated on back precautions and ADLs; handout given.  Pt has a reacher at home and wife/son can assist as needed.  Pt has a tub:  he is unsteady and not ready to step in at this time.  Educated on  sidestepping when ready and showed photo of tub bench:  This would be safe option:  Explained turning whole body to avoid twisting.     Vision     Perception     Praxis      Pertinent Vitals/Pain Pain Assessment: 0-10 Pain Score: 6  Pain Location: back Pain Descriptors / Indicators: Aching Pain Intervention(s): Limited activity within patient's tolerance;Monitored during session;Premedicated before session;Repositioned     Hand Dominance     Extremity/Trunk Assessment Upper Extremity Assessment Upper Extremity Assessment: Overall WFL for tasks assessed           Communication Communication Communication: No difficulties   Cognition Arousal/Alertness: Awake/alert Behavior During Therapy: WFL for tasks assessed/performed Overall Cognitive Status: Within Functional Limits for tasks assessed                     General Comments       Exercises       Shoulder Instructions      Home Living Family/patient expects to be discharged to:: Private residence Living Arrangements: Children;Spouse/significant other                 Bathroom Shower/Tub: Tub/shower unit Shower/tub characteristics: Engineer, building servicesCurtain Bathroom Toilet: Standard     Home Equipment: None          Prior Functioning/Environment Level of Independence: Independent  OT Diagnosis: Generalized weakness;Acute pain   OT Problem List:     OT Treatment/Interventions:      OT Goals(Current goals can be found in the care plan section) Acute Rehab OT Goals Patient Stated Goal: get back healed  OT Frequency:     Barriers to D/C:            Co-evaluation              End of Session    Activity Tolerance: Patient tolerated treatment well Patient left: in chair;with call bell/phone within reach   Time: 1610-9604 OT Time Calculation (min): 25 min Charges:  OT General Charges $OT Visit: 1 Procedure OT Evaluation $Initial OT Evaluation Tier I: 1 Procedure G-Codes:  OT G-codes **NOT FOR INPATIENT CLASS** Functional Assessment Tool Used: clinical observation Functional Limitation: Self care Self Care Current Status (V4098): At least 40 percent but less than 60 percent impaired, limited or restricted Self Care Goal Status (J1914): At least 40 percent but less than 60 percent impaired, limited or restricted Self Care Discharge Status [phone]): At least 40 percent but less than 60 percent impaired, limited or restricted  [facility] 01/27/2015, 8:52 AM  Marica Otter, OTR/L [phone] 01/27/2015

## 2015-01-27 NOTE — Progress Notes (Signed)
01/27/15  0930  Reviewed discharge instructions with patient. Patient verbalized understanding of discharge instructions.  Copy of discharge instructions and prescriptions given to patient.

## 2015-01-31 NOTE — Discharge Summary (Signed)
Physician Discharge Summary   Patient ID: Riley Wright MRN: [id number] DOB/AGE: 11/02/1960 54 y.o.  Admit date: 01/26/2015 Discharge date: 01/27/2015  Primary Diagnosis: Lumbar spinal stenosis   Admission Diagnoses:  Past Medical History  Diagnosis Date  . Heart murmur   . Arthritis     bil hips   Discharge Diagnoses:   Active Problems:   Spinal stenosis, lumbar region, with neurogenic claudication  Estimated body mass index is 29.85 kg/(m^2) as calculated from the following:   Height as of this encounter: _0  (1.854 m).   Weight as of this encounter: 102.598 kg (226 lb 3 oz).  Procedure:  Procedure(s) (LRB): LUMBAR DECOMPRESSION L4-5 CENTRAL AND TO THE RIGHT (Right)   Consults: None  HPI: The patient presented with the chief complaint of low back pain that radiated into the right leg after injuring his back at work.   Laboratory Data: Hospital Outpatient Visit on 01/21/2015  Component Date Value Ref Range Status  . ABO/RH(D) 01/21/2015 O POS   Final  Hospital Outpatient Visit on 01/21/2015  Component Date Value Ref Range Status  . MRSA, PCR 01/21/2015 NEGATIVE  NEGATIVE Final  . Staphylococcus aureus 01/21/2015 NEGATIVE  NEGATIVE Final   Comment:        The Xpert SA Assay (FDA approved for NASAL specimens in patients over 53 years of age), is one component of a comprehensive surveillance program.  Test performance has been validated by [facility] for patients greater than or equal to 53 year old. It is not intended to diagnose infection nor to guide or monitor treatment.   . WBC 01/21/2015 5.8  4.0 - 10.5 K/uL Final  . RBC 01/21/2015 4.77  4.22 - 5.81 MIL/uL Final  . Hemoglobin 01/21/2015 14.4  13.0 - 17.0 g/dL Final  . HCT 01/21/2015 43.3  39.0 - 52.0 % Final  . MCV 01/21/2015 90.8  78.0 - 100.0 fL Final  . MCH 01/21/2015 30.2  26.0 - 34.0 pg Final  . MCHC 01/21/2015 33.3  30.0 - 36.0 g/dL Final  . RDW 01/21/2015 14.1  11.5 - 15.5 % Final  . Platelets  01/21/2015 181  150 - 400 K/uL Final  . Neutrophils Relative % 01/21/2015 46   Final  . Neutro Abs 01/21/2015 2.6  1.7 - 7.7 K/uL Final  . Lymphocytes Relative 01/21/2015 44   Final  . Lymphs Abs 01/21/2015 2.6  0.7 - 4.0 K/uL Final  . Monocytes Relative 01/21/2015 7   Final  . Monocytes Absolute 01/21/2015 0.4  0.1 - 1.0 K/uL Final  . Eosinophils Relative 01/21/2015 3   Final  . Eosinophils Absolute 01/21/2015 0.2  0.0 - 0.7 K/uL Final  . Basophils Relative 01/21/2015 0   Final  . Basophils Absolute 01/21/2015 0.0  0.0 - 0.1 K/uL Final  . Sodium 01/21/2015 139  135 - 145 mmol/L Final  . Potassium 01/21/2015 4.0  3.5 - 5.1 mmol/L Final  . Chloride 01/21/2015 105  101 - 111 mmol/L Final  . CO2 01/21/2015 27  22 - 32 mmol/L Final  . Glucose, Bld 01/21/2015 101* 65 - 99 mg/dL Final  . BUN 01/21/2015 13  6 - 20 mg/dL Final  . Creatinine, Ser 01/21/2015 1.04  0.61 - 1.24 mg/dL Final  . Calcium 01/21/2015 9.7  8.9 - 10.3 mg/dL Final  . Total Protein 01/21/2015 7.3  6.5 - 8.1 g/dL Final  . Albumin 01/21/2015 4.7  3.5 - 5.0 g/dL Final  . AST 01/21/2015 30  15 - 41  U/L Final  . ALT 01/21/2015 52  17 - 63 U/L Final  . Alkaline Phosphatase 01/21/2015 43  38 - 126 U/L Final  . Total Bilirubin 01/21/2015 1.3* 0.3 - 1.2 mg/dL Final  . GFR calc non Af Amer 01/21/2015 >60  >60 mL/min Final  . GFR calc Af Amer 01/21/2015 >60  >60 mL/min Final   Comment: (NOTE) The eGFR has been calculated using the CKD EPI equation. This calculation has not been validated in all clinical situations. eGFR's persistently <60 mL/min signify possible Chronic Kidney Disease.   . Anion gap 01/21/2015 7  5 - 15 Final  . Prothrombin Time 01/21/2015 12.9  11.6 - 15.2 seconds Final  . INR 01/21/2015 0.95  0.00 - 1.49 Final  . ABO/RH(D) 01/21/2015 O POS   Final  . Antibody Screen 01/21/2015 NEG   Final  . Sample Expiration 01/21/2015 01/29/2015   Final     X-Rays:Dg Chest 2 View  01/21/2015  CLINICAL DATA:  Preop  lumbar decompression. EXAM: CHEST  2 VIEW COMPARISON:  06/14/2014 FINDINGS: There is no focal parenchymal opacity. There is no pleural effusion or pneumothorax. The heart and mediastinal contours are unremarkable. There is an S-shaped scoliosis of the thoracolumbar spine. IMPRESSION: No active cardiopulmonary disease. Electronically Signed   By: Kathreen Devoid   On: 01/21/2015 12:06   Dg Lumbar Spine 2-3 Views  01/21/2015  CLINICAL DATA:  For lumbar spine surgery at L4-5, low back pain EXAM: LUMBAR SPINE - 2-3 VIEW COMPARISON:  Lumbar spine films of 06/14/2014 FINDINGS: Lumbar scoliosis convex to the left again is noted of approximately 30 degrees. In the lateral view the lumbar vertebrae are normal alignment. There appears to be partial fusion of L3-4. The remainder of disc spaces are relatively well preserved. No compression deformity is seen. The SI joints are corticated. The bowel gas pattern is nonspecific. IMPRESSION: 1. Apparent partial fusion at L3-4. 2. Lumbar scoliosis convex to the left by 30 degrees. Electronically Signed   By: Ivar Drape M.D.   On: 01/21/2015 12:04   Dg Spine Portable 1 View  01/26/2015  CLINICAL DATA:  Lumbar decompression EXAM: PORTABLE SPINE - 1 VIEW COMPARISON:  Studies obtained earlier in the day FINDINGS: Lateral lumbar image is labeled #4. There are metallic probes with the tips posterior to the levels of the inferior aspect of the L4 vertebral body and the mid L5 vertebral body. Postoperative defect noted involving the L4-L5 spinous processes. Moderate narrowing at L3-4, L4-5, L5-S1 is stable. No fracture or spondylolisthesis. IMPRESSION: Metallic probe tips are posterior to the L4 and L5 vertebral bodies with the more superiorly placed probe directed posterior L5 in the more inferiorly placed probe posterior to the L4 level. Stable postoperative change and arthropathy. Electronically Signed   By: Lowella Grip III M.D.   On: 01/26/2015 16:46   Dg Spine Portable 1  View  01/26/2015  CLINICAL DATA:  Lumbar decompression EXAM: PORTABLE SPINE - 1 VIEW COMPARISON:  Studies obtained earlier in the day FINDINGS: Cross-table lateral lumbar images labeled #3. Metallic probe tip overlies the L4 spinous process region at the level of the inferior aspect of the L4 vertebral body. Postoperative defect involving the L4 and L5spinous processes is noted. There is moderate disc space narrowing at L3-4, L4-5, and L5-S1. No fracture or spondylolisthesis. IMPRESSION: Metallic probe tip is posterior to the inferior aspect of the L4 vertebral body at the level of the previous L4 spinous process. Lumbar arthropathy is stable. No fracture  or spondylolisthesis. Electronically Signed   By: Lowella Grip III M.D.   On: 01/26/2015 16:43   Dg Spine Portable 1 View  01/26/2015  CLINICAL DATA:  Surgical level L4-5. EXAM: PORTABLE SPINE - 1 VIEW COMPARISON:  01/26/2015 FINDINGS: Posterior surgical instrument is directed toward the L3-4 disc space. IMPRESSION: Intraoperative localization as above. Electronically Signed   By: Rolm Baptise M.D.   On: 01/26/2015 16:21   Dg Spine Portable 1 View  01/26/2015  CLINICAL DATA:  Lumbar stenosis. EXAM: PORTABLE SPINE - 1 VIEW COMPARISON:  Radiographs dated 01/21/2015 FINDINGS: Lateral image 1 demonstrates needles between the spinous processes of L2-3 and at the fused spinous processes of L3-4. IMPRESSION: Needles at L2-3 and L3-4. Electronically Signed   By: Lorriane Shire M.D.   On: 01/26/2015 16:06    EKG: Orders placed or performed during the hospital encounter of 01/21/15  . EKG  . EKG     Hospital Course: Gussie Towson is a 54 y.o. who was admitted to [facility]. They were brought to the operating room on 01/26/2015 and underwent Procedure(s): LUMBAR DECOMPRESSION L4-5 CENTRAL AND TO THE RIGHT.  Patient tolerated the procedure well and was later transferred to the recovery room and then to the orthopaedic floor for postoperative  care.  They were given PO and IV analgesics for pain control following their surgery.  They were given 24 hours of postoperative antibiotics of  Anti-infectives    Start     Dose/Rate Route Frequency Ordered Stop   01/27/15 0600  ceFAZolin (ANCEF) IVPB 2 g/50 mL premix     2 g 100 mL/hr over 30 Minutes Intravenous On call to O.R. 01/26/15 1458 01/26/15 1515   01/26/15 2200  ceFAZolin (ANCEF) IVPB 1 g/50 mL premix  Status:  Discontinued     1 g 100 mL/hr over 30 Minutes Intravenous 3 times per day 01/26/15 1840 01/27/15 1407   01/26/15 1608  polymyxin B 500,000 Units, bacitracin 50,000 Units in sodium chloride irrigation 0.9 % 500 mL irrigation  Status:  Discontinued       As needed 01/26/15 1609 01/26/15 1743     and started on DVT prophylaxis in the form of Aspirin.   PT was ordered.  Discharge planning consulted to help with postop disposition and equipment needs.  Patient had a fair night on the evening of surgery.  They started to get up OOB with therapy on day one.  Dressing was changed and the incision was clean and dry.  Incision was healing well.  Patient was seen in rounds and was ready to go home.   Diet: Regular diet Activity:WBAT Follow-up:in 2 weeks Disposition - Home Discharged Condition: stable   Discharge Instructions    Call MD / Call 911    Complete by:  As directed   If you experience chest pain or shortness of breath, CALL 911 and be transported to the hospital emergency room.  If you develope a fever above 101 F, pus (white drainage) or increased drainage or redness at the wound, or calf pain, call your surgeon's office.     Constipation Prevention    Complete by:  As directed   Drink plenty of fluids.  Prune juice may be helpful.  You may use a stool softener, such as Colace (over the counter) 100 mg twice a day.  Use MiraLax (over the counter) for constipation as needed.     Diet general    Complete by:  As directed  Discharge instructions    Complete by:  As  directed   For the first three days, remove your dressing, tape a piece of saran wrap over your incision Take your shower, then remove the saran wrap and put a clean dressing on. After three days you can shower without the saran wrap.  No driving while taking pain medications No lifting Take aspirin 386m daily to prevent blood clots Call Dr. GGladstone Lighterif any wound complications or temperature of 101 degrees F or over.  Call the office for an appointment to see Dr. GGladstone Lighterin two weeks: [phone] ask for Dr. GCharlestine Nightnurse, TBrunilda Payor     Increase activity slowly as tolerated    Complete by:  As directed             Medication List    STOP taking these medications        cyclobenzaprine 10 MG tablet  Commonly known as:  FLEXERIL     traMADol 50 MG tablet  Commonly known as:  ULTRAM      TAKE these medications        methocarbamol 500 MG tablet  Commonly known as:  ROBAXIN  Take 1 tablet (500 mg total) by mouth every 6 (six) hours as needed for muscle spasms.     naproxen sodium 220 MG tablet  Commonly known as:  ANAPROX  Take 220 mg by mouth 2 (two) times daily as needed (for pain).     oxyCODONE-acetaminophen 5-325 MG tablet  Commonly known as:  PERCOCET/ROXICET  Take 1-2 tablets by mouth every 4 (four) hours as needed for moderate pain.           Follow-up Information    Follow up with GIOFFRE,RONALD A, MD In 2 weeks.   Specialty:  Orthopedic Surgery   Contact information:   [address] [phone]      Signed: AArdeen Jourdain PA-C Orthopaedic Surgery 01/31/2015, 7:07 AM

## 2015-11-11 DIAGNOSIS — Z0271 Encounter for disability determination: Secondary | ICD-10-CM

## 2016-02-08 IMAGING — CR DG THORACIC SPINE 2V
2 series · 2 of 2 positions shown · non-contrast
Comparison: None.

CLINICAL DATA: Upper back pain

EXAM:
THORACIC SPINE - 2 VIEW

[AP]
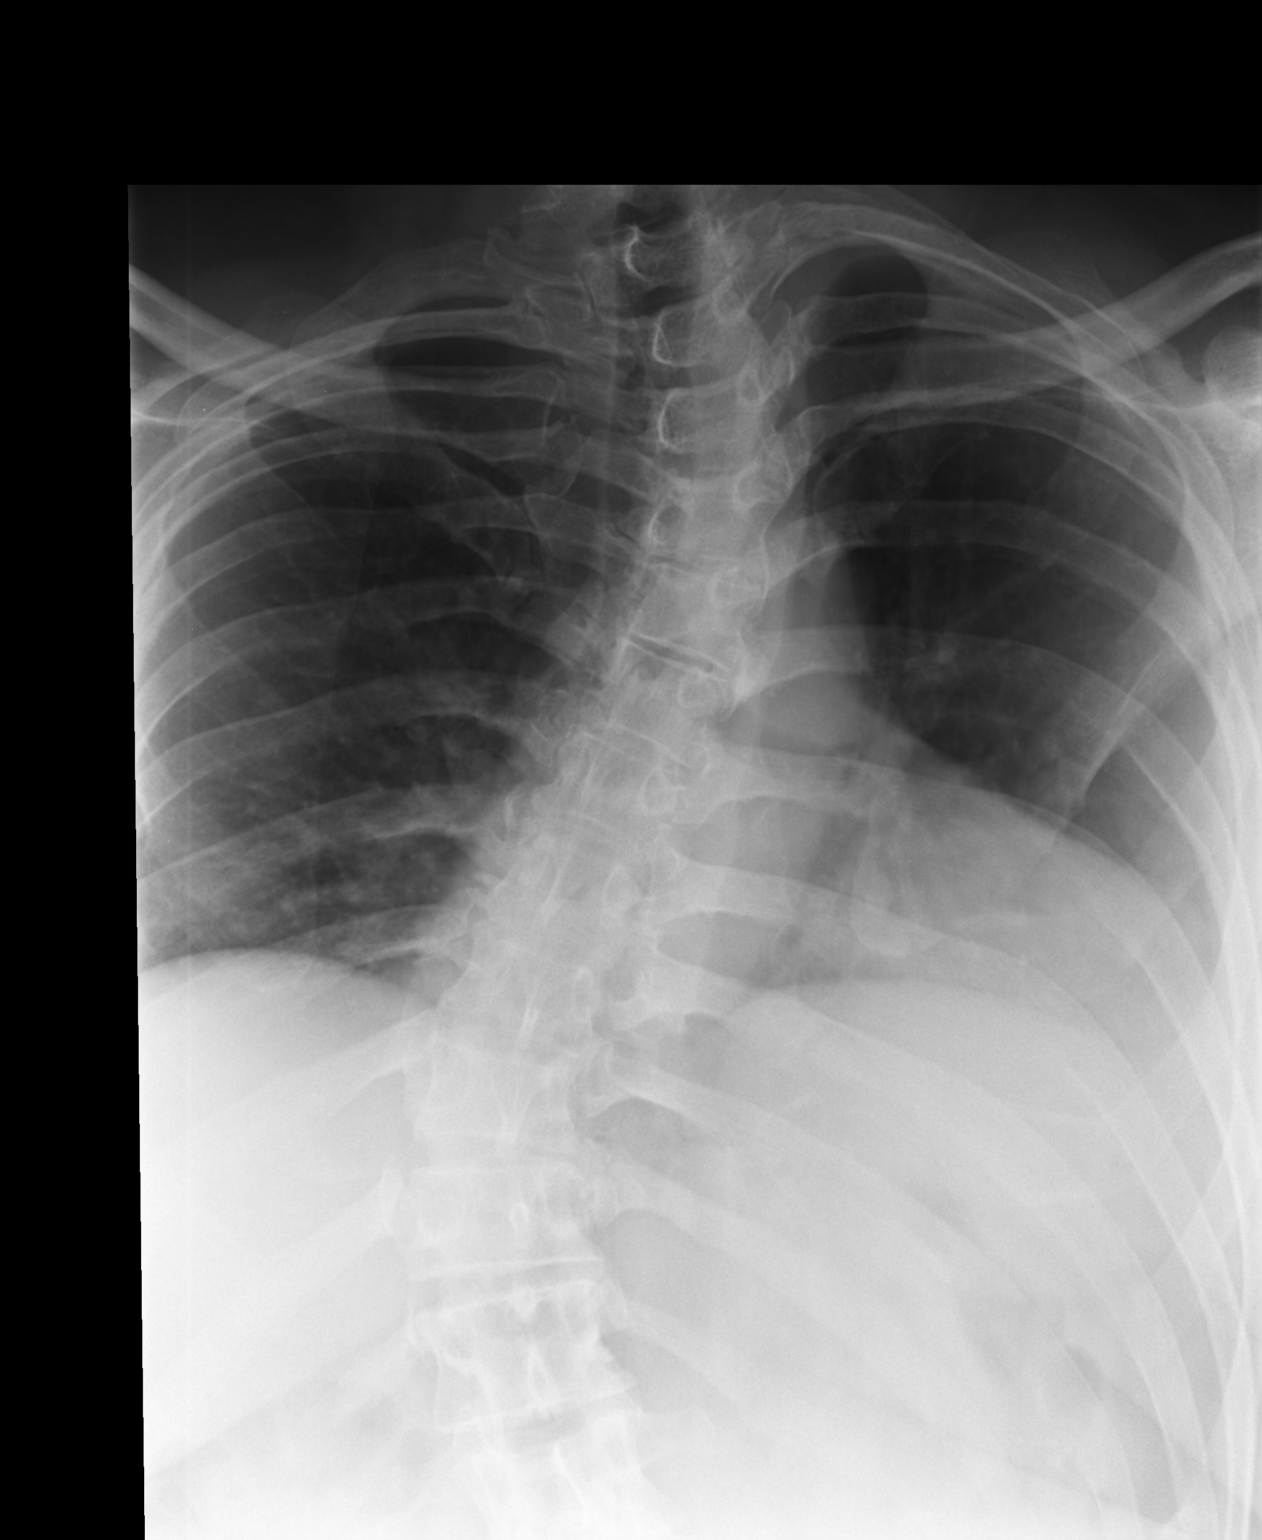

[lateral]
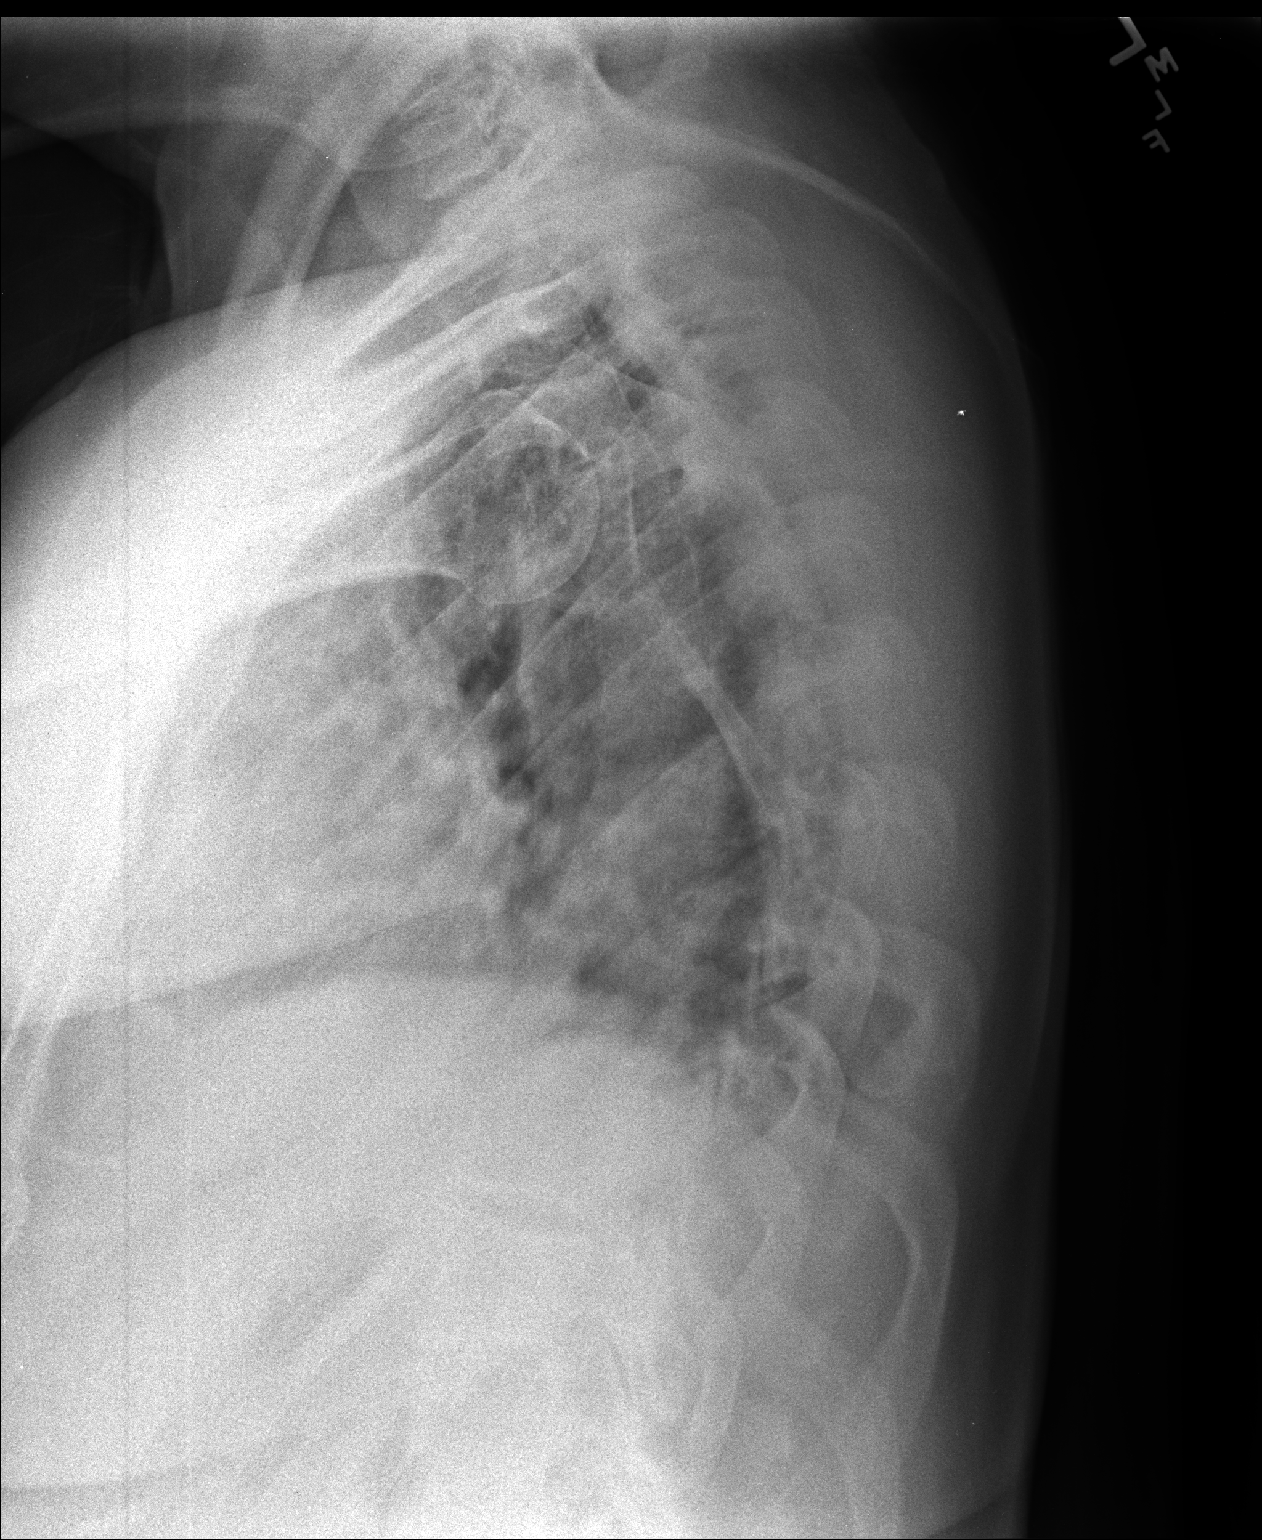

[2 of 2 positions shown; findings below may reference images not displayed]

FINDINGS: Scoliosis of the thoracolumbar spine is noted with primary curve in
the lower thoracic spine concave to the left with compensatory
curves in the upper thoracic spine in lower lumbar spine. No
vertebral anomaly is seen. No definitive compression deformity is
seen. No rib abnormality is noted.
IMPRESSION: Scoliosis without acute abnormality.

## 2016-04-24 ENCOUNTER — Ambulatory Visit: Payer: BLUE CROSS/BLUE SHIELD

## 2016-05-09 ENCOUNTER — Ambulatory Visit: Payer: BLUE CROSS/BLUE SHIELD

## 2016-05-31 ENCOUNTER — Ambulatory Visit (INDEPENDENT_AMBULATORY_CARE_PROVIDER_SITE_OTHER): Payer: BLUE CROSS/BLUE SHIELD | Admitting: Physician Assistant

## 2016-05-31 VITALS — BP 128/92 | HR 80 | Temp 98.3°F | Ht 72.0 in | Wt 227.0 lb

## 2016-05-31 DIAGNOSIS — M5442 Lumbago with sciatica, left side: Secondary | ICD-10-CM

## 2016-05-31 DIAGNOSIS — M5441 Lumbago with sciatica, right side: Secondary | ICD-10-CM | POA: Diagnosis not present

## 2016-05-31 DIAGNOSIS — G8929 Other chronic pain: Secondary | ICD-10-CM | POA: Diagnosis not present

## 2016-05-31 MED ORDER — MELOXICAM 15 MG PO TABS: 15.0000 mg | ORAL_TABLET | Freq: Every day | ORAL | 1 refills | Status: DC

## 2016-05-31 MED ORDER — METHOCARBAMOL 500 MG PO TABS: 500.0000 mg | ORAL_TABLET | Freq: Four times a day (QID) | ORAL | 1 refills | Status: AC | PRN

## 2016-05-31 NOTE — Progress Notes (Signed)
Patient ID: Riley GeorgesBilly Wright, male    DOB: July 10, 1960, 56 y.o.   MRN: [id number]  PCP: No PCP Per Patient  Chief Complaint  Patient presents with  . Back Pain    x5284mo -1 yr post op discectomy w/Dr. Darrelyn HillockGioffre - WC injury - needs pain med  . Elevated Blood pressure at triage    Subjective:   Presents for evaluation of chronic back pain post lumbar decompression surgery in 2016. He states for the last 6 months he has had difficulty sleeping and pain when ambulatory which he has been taking Aleve or Naproxen for which helps some. He denies any loss of bowl/bladder function, saddle paresthesia. Pain is a 6/10.   Upper lumbar tenderness,  Bi lateral SI join tenderness worse on left   lypoma   Review of Systems  Constitutional: Negative.   HENT: Negative.   Eyes: Negative.   Respiratory: Negative.   Cardiovascular: Negative.   Gastrointestinal: Negative.   Endocrine: Negative.   Genitourinary: Negative.   Musculoskeletal: Positive for back pain and myalgias.  Skin: Negative.   Allergic/Immunologic: Negative.   Neurological: Negative.   Hematological: Negative.   Psychiatric/Behavioral: Negative.        Patient Active Problem List   Diagnosis Date Noted  . Spinal stenosis, lumbar region, with neurogenic claudication 01/26/2015     Prior to Admission medications   Medication Sig Start Date End Date Taking? Authorizing Provider  methocarbamol (ROBAXIN) 500 MG tablet Take 1 tablet (500 mg total) by mouth every 6 (six) hours as needed for muscle spasms. Patient not taking: Reported on 05/31/2016 01/27/15   Dimitri PedAmber Constable, PA-C  naproxen sodium (ANAPROX) 220 MG tablet Take 220 mg by mouth 2 (two) times daily as needed (for pain).    Historical Provider, MD  oxyCODONE-acetaminophen (PERCOCET/ROXICET) 5-325 MG tablet Take 1-2 tablets by mouth every 4 (four) hours as needed for moderate pain. Patient not taking: Reported on 05/31/2016 01/27/15   Dimitri PedAmber Constable, PA-C     No  Known Allergies     Objective:  Physical Exam  Constitutional: He is oriented to person, place, and time. He appears well-developed and well-nourished.  Blood pressure (!) 128/92, pulse 80, temperature 98.3 F (36.8 C), temperature source Oral, height 6' (1.829 m), weight 227 lb (103 kg), SpO2 96 %.  Cardiovascular: Normal rate, regular rhythm and normal heart sounds.   Pulmonary/Chest: Effort normal and breath sounds normal.  Musculoskeletal: He exhibits tenderness.       Lumbar back: He exhibits tenderness and bony tenderness.       Back:       Legs: Neurological: He is alert and oriented to person, place, and time.  Reflex Scores:      Patellar reflexes are 1+ on the right side and 2+ on the left side.      Achilles reflexes are 2+ on the right side and 2+ on the left side. Slight weakness in his right hip and knee in all planes of movement   Skin: Skin is warm and dry.  Psychiatric: He has a normal mood and affect. His behavior is normal. Judgment and thought content normal.  Vitals reviewed.     Assessment & Plan:  1. Chronic bilateral low back pain with bilateral sciatica Pain needs to be addressed by the orthopedist who performed the surgery. We have prescribed the below medications with the intent of improving his quality of sleep and lowering the pain during the day. We talked about how although  he may never be pain free again we are both working towards the goal of improved quality of life and reducing his pain.  Physical Therapy may be an option we can pursue if not offered by the orthopedist and he continues to have pain.  - methocarbamol (ROBAXIN) 500 MG tablet; Take 1 tablet (500 mg total) by mouth every 6 (six) hours as needed for muscle spasms.  Dispense: 120 tablet; Refill: 1 - meloxicam (MOBIC) 15 MG tablet; Take 1 tablet (15 mg total) by mouth daily.  Dispense: 30 tablet; Refill: 1 - Ambulatory referral to Orthopedic Surgery - Care order/instruction:

## 2016-05-31 NOTE — Progress Notes (Signed)
Patient ID: Riley Wright, male    DOB: 02/09/1961, 56 y.o.   MRN: [id number]  PCP: No PCP Per Patient  Chief Complaint  Patient presents with  . Back Pain    x12mo -1 yr post op discectomy w/Dr. Darrelyn Hillock - WC injury - needs pain med  . Elevated Blood pressure at triage    Subjective:   Presents for evaluation of low back pain.  Patient underwent lumbar decompression L4-5 01/27/2015 for treatment of lumbar spinal stenosis with neurogenic claudication, following a work injury. 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 Darrelyn Hillock. The patient states that the [facility] case has been settled.  For the past 6 months he has had worsening pain in the low back with pain radiating into the legs. He has increased pain with sleeping, walking and with bending. OTC NSAIDS help some. Pain rated 6/10. He recalls having a muscle relaxer previously that was helpful.  No loss of bowel or bladder control. No saddle anesthesia. No fever, chills.   Review of Systems     Patient Active Problem List   Diagnosis Date Noted  . Spinal stenosis, lumbar region, with neurogenic claudication 01/26/2015     No medications on file.   No Known Allergies     Objective:  Physical Exam  Constitutional: He is oriented to person, place, and time. He appears well-developed and well-nourished. He is active and cooperative. No distress.  BP (!) 128/92 (BP Location: Right Arm, Cuff Size: Large)   Pulse 80   Temp 98.3 F (36.8 C) (Oral)   Ht 6' (1.829 m)   Wt 227 lb (103 kg)   SpO2 96%   BMI 30.79 kg/m   HENT:  Head: Normocephalic and atraumatic.  Right Ear: Hearing normal.  Left Ear: Hearing normal.  Eyes: Conjunctivae are normal. No scleral icterus.  Neck: Normal range of motion. Neck supple. No thyromegaly present.  Cardiovascular: Normal rate, regular rhythm and normal heart sounds.   Pulses:      Radial pulses are 2+ on the right side, and 2+  on the left side.  Pulmonary/Chest: Effort normal and breath sounds normal.  Musculoskeletal:       Right hip: He exhibits decreased strength. He exhibits normal range of motion, no tenderness and no bony tenderness.       Left hip: He exhibits decreased strength. He exhibits normal range of motion, no tenderness and no bony tenderness.       Right knee: He exhibits normal range of motion. No tenderness found.       Left knee: He exhibits normal range of motion. No tenderness found.       Cervical back: Normal.       Thoracic back: Normal.       Lumbar back: He exhibits tenderness (midline, SI joints R>L), bony tenderness and pain. He exhibits no swelling, no edema, no deformity, no laceration, no spasm and normal pulse.       Legs: Lymphadenopathy:       Head (right side): No tonsillar, no preauricular, no posterior auricular and no occipital adenopathy present.       Head (left side): No tonsillar, no preauricular, no posterior auricular and no occipital adenopathy present.    He has no cervical adenopathy.       Right: No supraclavicular adenopathy present.       Left: No supraclavicular adenopathy present.  Neurological: He  is alert and oriented to person, place, and time. He displays no atrophy. A sensory deficit (paresthesia of RIGHT lateral foot and RIGHT great toe) is present.  Reflex Scores:      Patellar reflexes are 2+ on the right side and 2+ on the left side.      Achilles reflexes are 2+ on the right side and 2+ on the left side. Skin: Skin is warm, dry and intact. No rash noted. No cyanosis or erythema. Nails show no clubbing.  Psychiatric: He has a normal mood and affect. His speech is normal and behavior is normal.           Assessment & Plan:   1. Chronic bilateral low back pain with bilateral sciatica Refer back to Dr. Darrelyn HillockGioffre for additional evaluation and treatment, likely to inclue updated imaging and physical therapy. Trial of meloxicam in place of OTC NSAIDS.  Resume methocarbamol. - methocarbamol (ROBAXIN) 500 MG tablet; Take 1 tablet (500 mg total) by mouth every 6 (six) hours as needed for muscle spasms.  Dispense: 120 tablet; Refill: 1 - meloxicam (MOBIC) 15 MG tablet; Take 1 tablet (15 mg total) by mouth daily.  Dispense: 30 tablet; Refill: 1 - Ambulatory referral to Orthopedic Surgery - Care order/instruction:   Blood pressure noted slightly elevated. Likely in part due to pain and loss of sleep due to pain. Once his pain is better controlled, expect normalization. If not, re-evaluate and initiate antihypertensive.   Fernande Brashelle S. Danylle Ouk, PA-C Physician Assistant-Certified Primary Care at [facility]

## 2016-09-16 IMAGING — DX DG LUMBAR SPINE 2-3V
2 series · 2 of 2 positions shown · non-contrast
Comparison: Lumbar spine films of 06/14/2014

CLINICAL DATA: For lumbar spine surgery at L4-5, low back pain

EXAM:
LUMBAR SPINE - 2-3 VIEW

[l-spine ap]
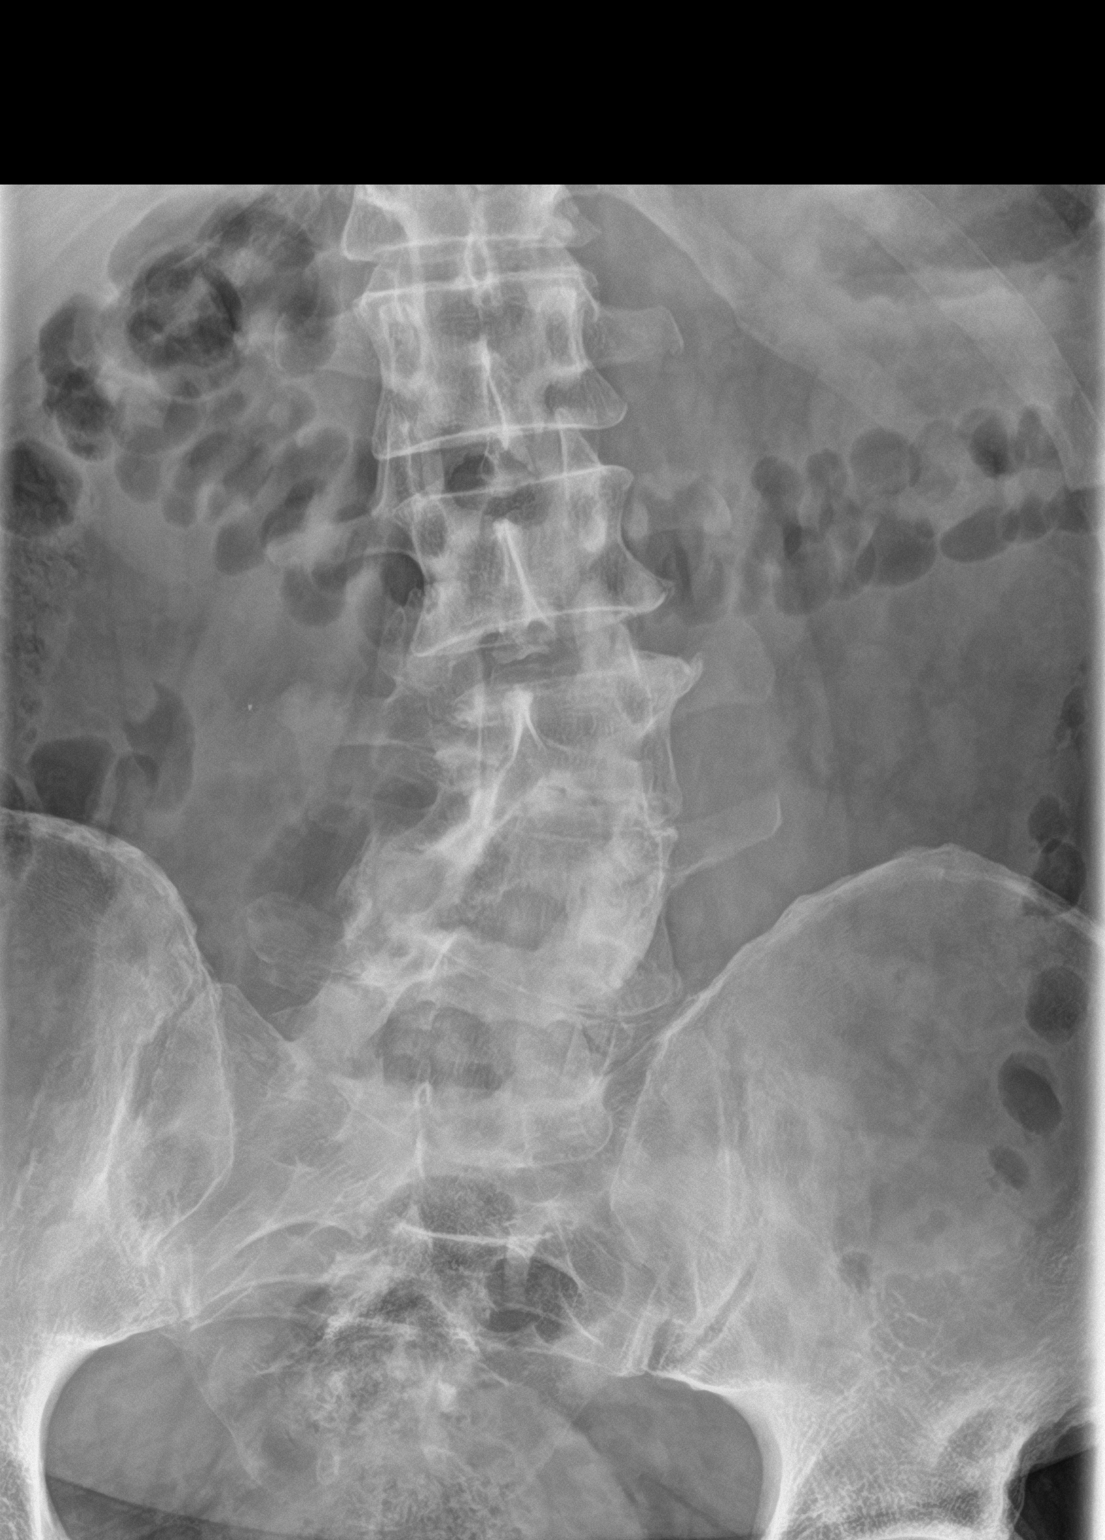

[l-spine lat]
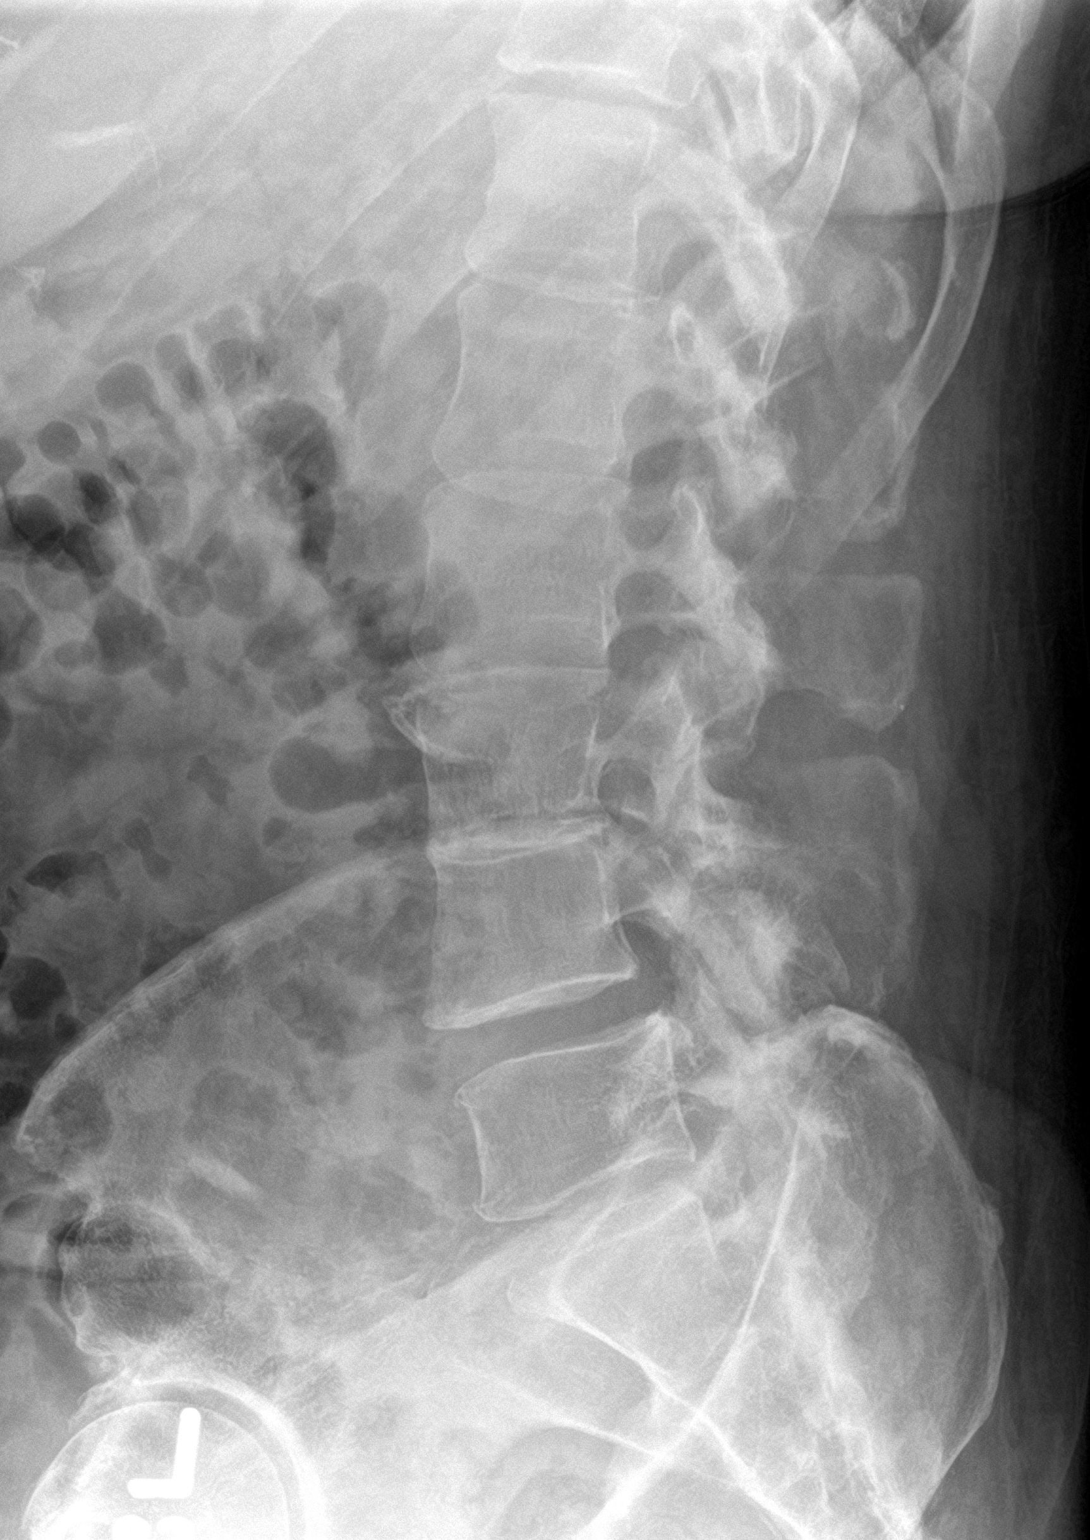

[2 of 2 positions shown; findings below may reference images not displayed]

FINDINGS: Lumbar scoliosis convex to the left again is noted of approximately
30 degrees. In the lateral view the lumbar vertebrae are normal
alignment. There appears to be partial fusion of L3-4. The remainder
of disc spaces are relatively well preserved. No compression
deformity is seen. The SI joints are corticated. The bowel gas
pattern is nonspecific.
IMPRESSION: 1. Apparent partial fusion at L3-4.
2. Lumbar scoliosis convex to the left by 30 degrees.

## 2016-09-21 IMAGING — DX DG SPINE 1V PORT
1 series · 1 of 1 positions shown · non-contrast
Comparison: Studies obtained earlier in the day

CLINICAL DATA: Lumbar decompression

EXAM:
PORTABLE SPINE - 1 VIEW

[l-spine x-table]
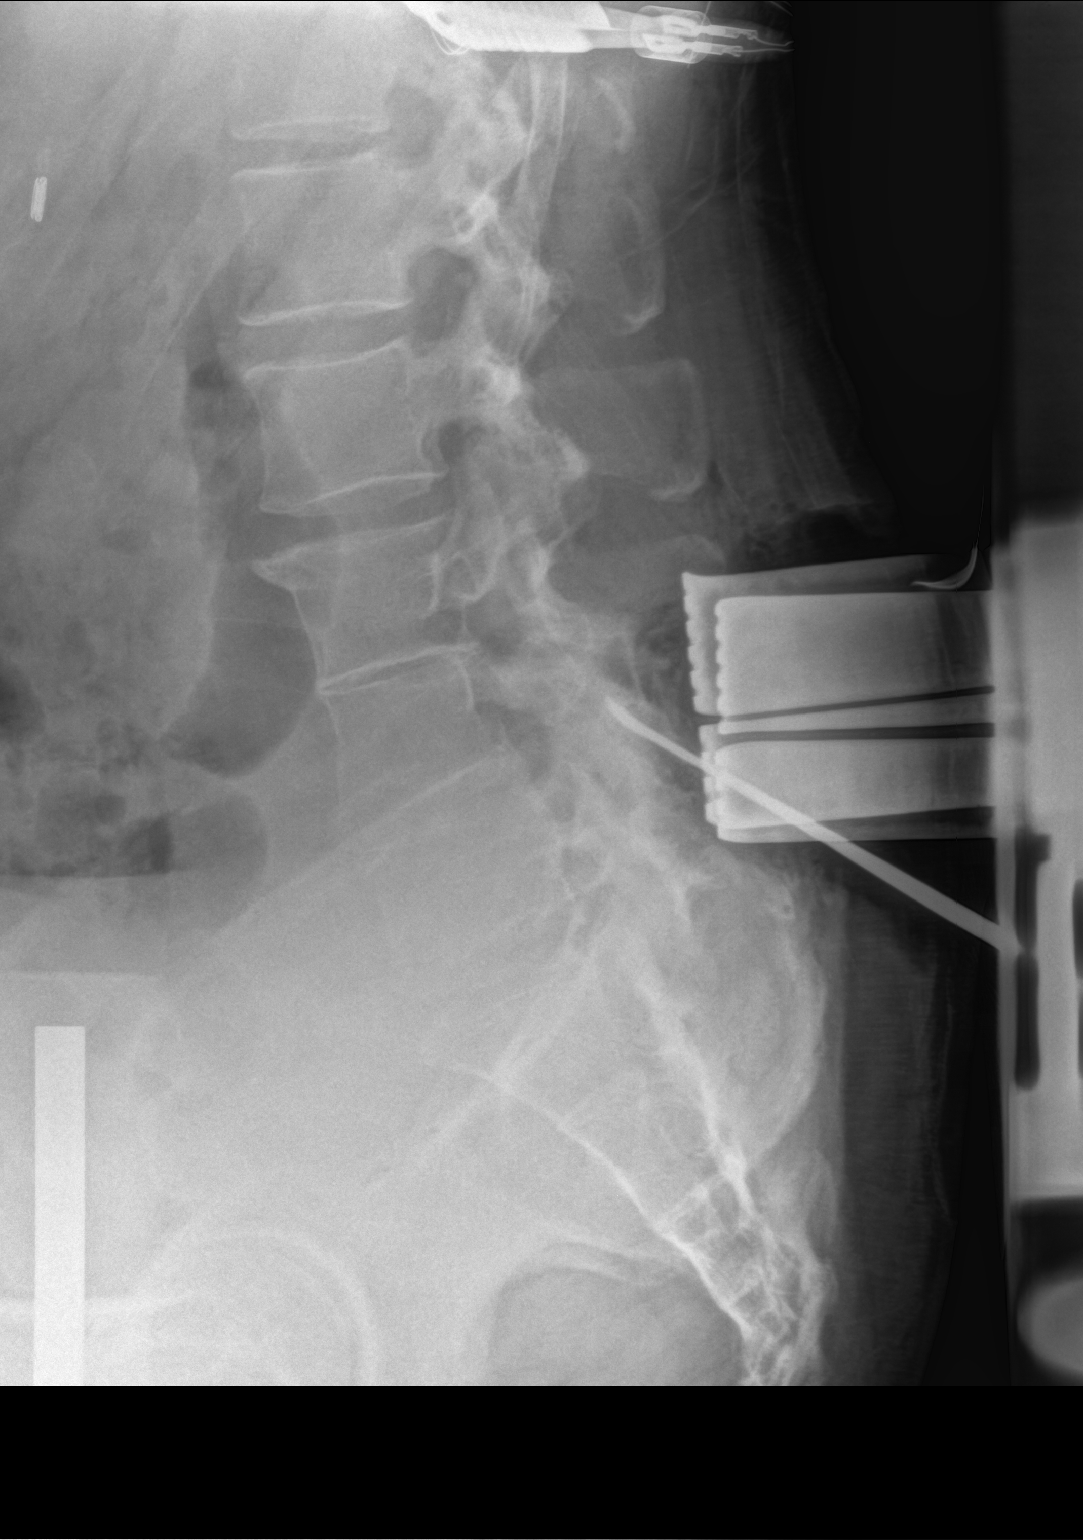

[1 of 1 positions shown; findings below may reference images not displayed]

FINDINGS: Cross-table lateral lumbar images labeled #3. Metallic probe tip
overlies the L4 spinous process region at the level of the inferior
aspect of the L4 vertebral body. Postoperative defect involving the
L4 and B8spinous processes is noted. There is moderate disc space
narrowing at L3-4, L4-5, and L5-S1. No fracture or
spondylolisthesis.
IMPRESSION: Metallic probe tip is posterior to the inferior aspect of the L4
vertebral body at the level of the previous L4 spinous process.
Lumbar arthropathy is stable. No fracture or spondylolisthesis.

## 2016-09-21 IMAGING — DX DG SPINE 1V PORT
1 series · 1 of 1 positions shown · non-contrast
Comparison: Studies obtained earlier in the day

CLINICAL DATA: Lumbar decompression

EXAM:
PORTABLE SPINE - 1 VIEW

[l-spine ap]
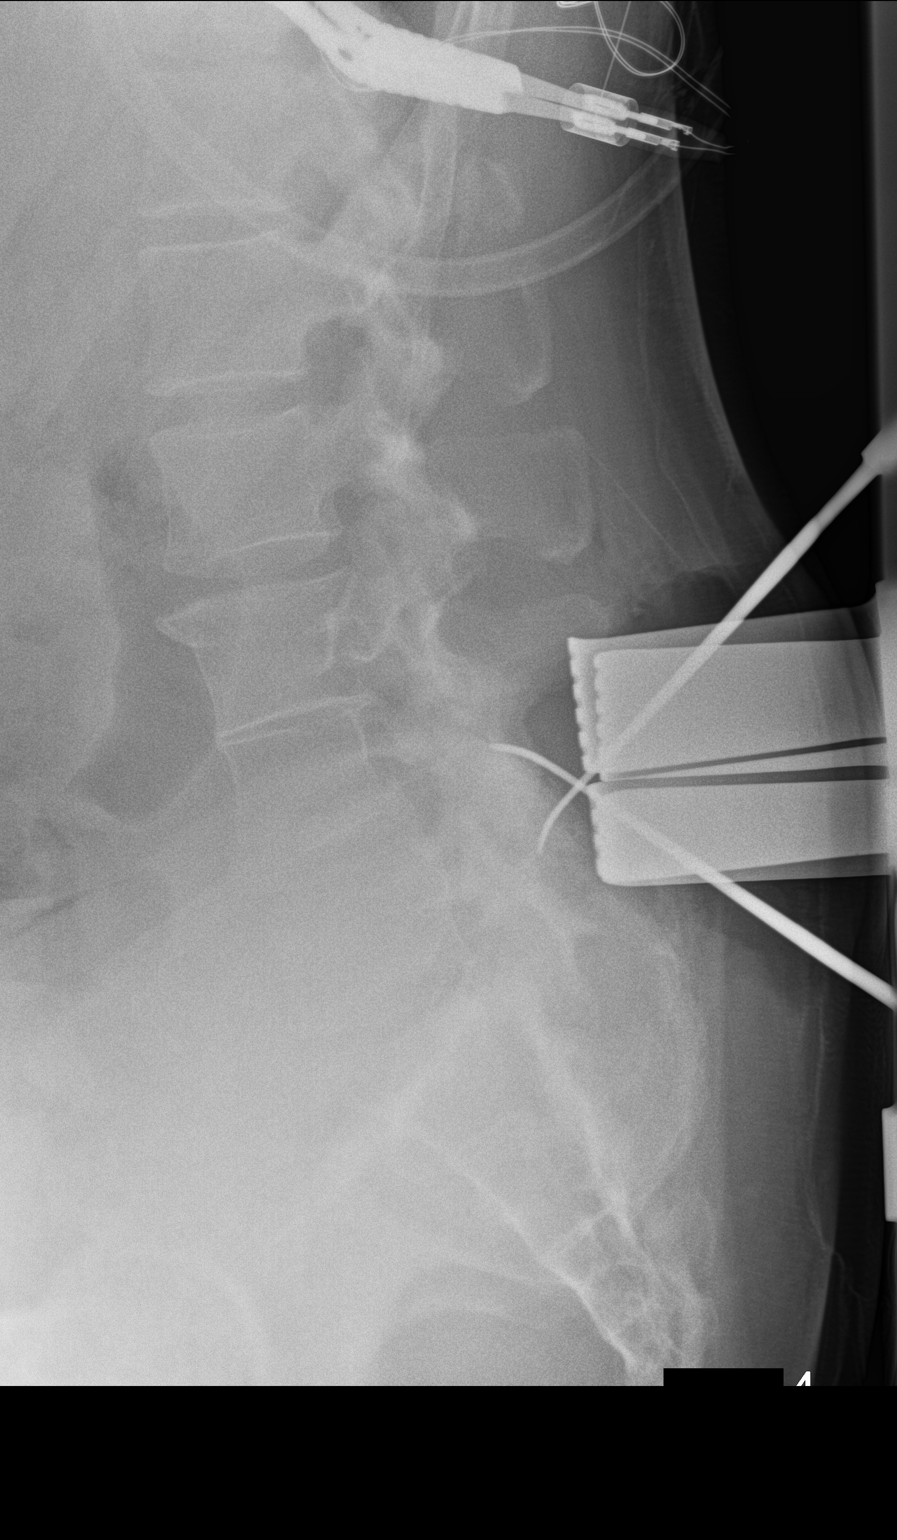

[1 of 1 positions shown; findings below may reference images not displayed]

FINDINGS: Lateral lumbar image is labeled #4. There are metallic probes with
the tips posterior to the levels of the inferior aspect of the L4
vertebral body and the mid L5 vertebral body. Postoperative defect
noted involving the L4-L5 spinous processes. Moderate narrowing at
L3-4, L4-5, L5-S1 is stable. No fracture or spondylolisthesis.
IMPRESSION: Metallic probe tips are posterior to the L4 and L5 vertebral bodies
with the more superiorly placed probe directed posterior L5 in the
more inferiorly placed probe posterior to the L4 level. Stable
postoperative change and arthropathy.

## 2016-10-23 ENCOUNTER — Encounter: Payer: BLUE CROSS/BLUE SHIELD | Admitting: Emergency Medicine

## 2019-10-27 ENCOUNTER — Emergency Department (HOSPITAL_COMMUNITY): Payer: Medicare Other

## 2019-10-27 ENCOUNTER — Encounter (HOSPITAL_COMMUNITY): Payer: Self-pay

## 2019-10-27 ENCOUNTER — Emergency Department (HOSPITAL_COMMUNITY)
Admission: EM | Admit: 2019-10-27 | Discharge: 2019-10-27 | Disposition: A | Discharge status: Home / Self Care | Payer: Medicare Other | Attending: Emergency Medicine | Admitting: Emergency Medicine

## 2019-10-27 DIAGNOSIS — Z87891 Personal history of nicotine dependence: Secondary | ICD-10-CM | POA: Insufficient documentation

## 2019-10-27 DIAGNOSIS — S2242XA Multiple fractures of ribs, left side, initial encounter for closed fracture: Secondary | ICD-10-CM | POA: Insufficient documentation

## 2019-10-27 DIAGNOSIS — S299XXA Unspecified injury of thorax, initial encounter: Secondary | ICD-10-CM | POA: Diagnosis present

## 2019-10-27 DIAGNOSIS — W010XXA Fall on same level from slipping, tripping and stumbling without subsequent striking against object, initial encounter: Secondary | ICD-10-CM | POA: Diagnosis not present

## 2019-10-27 DIAGNOSIS — Y999 Unspecified external cause status: Secondary | ICD-10-CM | POA: Diagnosis not present

## 2019-10-27 DIAGNOSIS — Y92014 Private driveway to single-family (private) house as the place of occurrence of the external cause: Secondary | ICD-10-CM | POA: Insufficient documentation

## 2019-10-27 DIAGNOSIS — Y939 Activity, unspecified: Secondary | ICD-10-CM | POA: Insufficient documentation

## 2019-10-27 LAB — BASIC METABOLIC PANEL
Anion gap: 9 (ref 5–15)
BUN: 15 mg/dL (ref 6–20)
CO2: 27 mmol/L (ref 22–32)
Calcium: 9.4 mg/dL (ref 8.9–10.3)
Chloride: 103 mmol/L (ref 98–111)
Creatinine, Ser: 1.22 mg/dL (ref 0.61–1.24)
GFR calc Af Amer: >60 mL/min (ref >60)
GFR calc non Af Amer: >60 mL/min (ref >60)
Glucose, Bld: 111 mg/dL — ABNORMAL HIGH (ref 70–99)
Potassium: 4 mmol/L (ref 3.5–5.1)
Sodium: 139 mmol/L (ref 135–145)

## 2019-10-27 LAB — CBC
HCT: 44.5 % (ref 39.0–52.0)
Hemoglobin: 14.6 g/dL (ref 13.0–17.0)
MCH: 30.8 pg (ref 26.0–34.0)
MCHC: 32.8 g/dL (ref 30.0–36.0)
MCV: 93.9 fL (ref 80.0–100.0)
Platelets: 219 10*3/uL (ref 150–400)
RBC: 4.74 MIL/uL (ref 4.22–5.81)
RDW: 13.8 % (ref 11.5–15.5)
WBC: 8.2 10*3/uL (ref 4.0–10.5)
nRBC: 0 % (ref 0.0–0.2)

## 2019-10-27 MED ORDER — OXYCODONE-ACETAMINOPHEN 5-325 MG PO TABS: 1.0000 | ORAL_TABLET | Freq: Four times a day (QID) | ORAL | 0 refills | Status: DC | PRN

## 2019-10-27 MED ORDER — IBUPROFEN 600 MG PO TABS: 600.0000 mg | ORAL_TABLET | Freq: Four times a day (QID) | ORAL | 0 refills | Status: DC | PRN

## 2019-10-27 MED ORDER — MORPHINE SULFATE (PF) 4 MG/ML IV SOLN
4.0000 mg | Freq: Once | INTRAVENOUS | Status: AC
  Administered 2019-10-27: 4 mg via INTRAVENOUS
  Filled 2019-10-27: qty 1

## 2019-10-27 MED ORDER — SODIUM CHLORIDE (PF) 0.9 % IJ SOLN
INTRAMUSCULAR | Status: AC
  Filled 2019-10-27: qty 50

## 2019-10-27 MED ORDER — ONDANSETRON HCL 4 MG/2ML IJ SOLN
4.0000 mg | Freq: Once | INTRAMUSCULAR | Status: AC
  Administered 2019-10-27: 4 mg via INTRAVENOUS
  Filled 2019-10-27: qty 2

## 2019-10-27 MED ORDER — KETOROLAC TROMETHAMINE 15 MG/ML IJ SOLN
15.0000 mg | Freq: Once | INTRAMUSCULAR | Status: AC
  Administered 2019-10-27: 15 mg via INTRAVENOUS
  Filled 2019-10-27: qty 1

## 2019-10-27 MED ORDER — LIDOCAINE 5 % EX PTCH
1.0000 | MEDICATED_PATCH | CUTANEOUS | Status: DC
  Administered 2019-10-27: 1 via TRANSDERMAL
  Filled 2019-10-27: qty 1

## 2019-10-27 MED ORDER — OXYCODONE-ACETAMINOPHEN 5-325 MG PO TABS
1.0000 | ORAL_TABLET | Freq: Once | ORAL | Status: AC
  Administered 2019-10-27: 1 via ORAL
  Filled 2019-10-27: qty 1

## 2019-10-27 MED ORDER — IOHEXOL 300 MG/ML  SOLN
75.0000 mL | Freq: Once | INTRAMUSCULAR | Status: AC | PRN
  Administered 2019-10-27: 75 mL via INTRAVENOUS

## 2019-10-27 NOTE — Discharge Instructions (Addendum)
Your CT scan shows 2 rib fractures on the left that are causing your pain.  Scan did not show any other significant injuries.  It did show some cysts to your kidney which are unrelated to your fall.  Your lab work overall look good.  With rib fractures the primary goals are pain control and using an incentive spirometer to help prevent pneumonia.  Take prescribed ibuprofen scheduled and use over-the-counter salon pas lidocaine patches as directed.  Use prescribed narcotic pain medication as needed for breakthrough pain.  These can cause drowsiness, do not take before driving.  Please follow-up with your primary care doctor to ensure rib fractures are healing well.  If you develop fevers, cough, worsening chest pain or shortness of breath return to the ED.

## 2019-10-27 NOTE — ED Triage Notes (Signed)
Arrived POV from home. Patient reports he fell a few days ago hurting left lower back and left ribs. Patient states pain is getting worse

## 2019-10-27 NOTE — ED Provider Notes (Signed)
[facility]-EMERGENCY DEPT Provider Note   CSN: [id number] Arrival date & time: 10/27/19  0022     History Chief Complaint  Patient presents with  . Rib Injury    Riley Wright is a 59 y.o. male.  Riley Wright is a 59 y.o. male with a history of arthritis and heart murmur, who presents to the emergency department for evaluation of left sided back and rib pain.  Patient states that 3 days ago he tripped when walking in his driveway and fell landing on his left side.  He did not hit his head during the fall.  He states that he was able to get up, but had immediate pain to the back and left lateral ribs.  He states that despite using over-the-counter pain medications his pain has been worsening over the past 3 days, and now pain is significantly worse with breathing, cough or any movement.  He denies pain in the anterior chest, reports no shortness of breath just pain when taking a deep breath.  Does report some pain in the thoracic spine but denies neck or low back pain.  No associated abdominal pain.  No numbness tingling or weakness in his extremities.  No loss of bowel or bladder control.  No other aggravating or alleviating factors.        Past Medical History:  Diagnosis Date  . Arthritis    bil hips  . Heart murmur     Patient Active Problem List   Diagnosis Date Noted  . Spinal stenosis, lumbar region, with neurogenic claudication 01/26/2015    Past Surgical History:  Procedure Laterality Date  . LUMBAR LAMINECTOMY/DECOMPRESSION MICRODISCECTOMY Right 01/26/2015   Procedure: LUMBAR DECOMPRESSION L4-5 CENTRAL AND TO THE RIGHT;  Surgeon: Ranee Gosselin, MD;  Location: [facility];  Service: Orthopedics;  Laterality: Right;       Family History  Problem Relation Age of Onset  . Stroke Mother   . Hypertension Mother   . Hypertension Father     Social History   Tobacco Use  . Smoking status: Former Smoker    Packs/day: 0.25    Types: Cigarettes     Quit date: 04/16/2016    Years since quitting: 3.5  . Smokeless tobacco: Never Used  Substance Use Topics  . Alcohol use: Yes    Alcohol/week: 2.0 standard drinks    Types: 2 Standard drinks or equivalent per week    Comment: a couple beers a night  . Drug use: No    Home Medications Prior to Admission medications   Medication Sig Start Date End Date Taking? Authorizing Provider  meloxicam (MOBIC) 15 MG tablet Take 1 tablet (15 mg total) by mouth daily. 05/31/16   Porfirio Oar, PA    Allergies    Patient has no known allergies.  Review of Systems   Review of Systems  Constitutional: Negative for chills and fever.  HENT: Negative.   Eyes: Negative for visual disturbance.  Respiratory: Negative for cough and shortness of breath.   Cardiovascular: Negative for chest pain.  Gastrointestinal: Negative for abdominal pain, nausea and vomiting.  Genitourinary: Negative for flank pain and hematuria.  Musculoskeletal: Positive for back pain and myalgias. Negative for arthralgias and neck pain.  Skin: Negative for color change and rash.  Neurological: Negative for dizziness, weakness, light-headedness and numbness.    Physical Exam Updated Vital Signs BP (!) 150/104 (BP Location: Left Arm)   Pulse 72   Temp 98.1 F (36.7 C) (Oral)  Resp 16   Ht 6\' 2"  (1.88 m)   Wt 99.8 kg   SpO2 100%   BMI 28.25 kg/m   Physical Exam Vitals and nursing note reviewed.  Constitutional:      General: He is not in acute distress.    Appearance: He is well-developed. He is not diaphoretic.  HENT:     Head: Normocephalic and atraumatic.     Comments: No evidence of head trauma Eyes:     General:        Right eye: No discharge.        Left eye: No discharge.     Pupils: Pupils are equal, round, and reactive to light.  Neck:     Comments: No midline C-spine tenderness. Cardiovascular:     Rate and Rhythm: Normal rate and regular rhythm.     Pulses: Normal pulses.     Heart sounds: Normal  heart sounds. No murmur heard.  No friction rub. No gallop.   Pulmonary:     Effort: Pulmonary effort is normal. No respiratory distress.     Breath sounds: Normal breath sounds. No wheezing or rales.     Comments: Patient able to speak in full sentences, does have some pain with deep breathing, but lungs are clear to auscultation bilaterally with lung sounds present and equal throughout.  He has significant tenderness and pain over the left posterior lateral ribs, there is no overlying bruising or palpable crepitus or deformity. Chest:     Chest wall: Tenderness present.  Abdominal:     General: Bowel sounds are normal. There is no distension.     Palpations: Abdomen is soft. There is no mass.     Tenderness: There is no abdominal tenderness. There is no guarding.     Comments: Abdomen soft, nondistended, nontender to palpation in all quadrants without guarding or peritoneal signs, no CVA tenderness  Musculoskeletal:        General: No deformity.     Cervical back: Neck supple.     Comments: There is some midline thoracic spine tenderness, no midline lumbar spine tenderness, no palpable step-off or deformity.  Patient is moving all extremities without difficulty, all joints supple and easily movable, all compartments soft  Skin:    General: Skin is warm and dry.     Capillary Refill: Capillary refill takes less than 2 seconds.  Neurological:     Mental Status: He is alert.     Coordination: Coordination normal.     Comments: Speech is clear, able to follow commands CN III-XII intact Normal strength in upper and lower extremities bilaterally including dorsiflexion and plantar flexion, strong and equal grip strength Sensation normal to light and sharp touch Moves extremities without ataxia, coordination intact  Psychiatric:        Mood and Affect: Mood normal.        Behavior: Behavior normal.     ED Results / Procedures / Treatments   Labs (all labs ordered are listed, but only  abnormal results are displayed) Labs Reviewed  BASIC METABOLIC PANEL - Abnormal; Notable for the following components:      Result Value   Glucose, Bld 111 (*)    All other components within normal limits  CBC    EKG None  Radiology DG Ribs Unilateral W/Chest Left  Result Date: 10/27/2019 CLINICAL DATA:  59 year old male status post fall twelve days ago with persistent left rib and pleuritic pain. EXAM: LEFT RIBS AND CHEST - 3+ VIEW COMPARISON:  Chest radiographs 01/21/2015. FINDINGS: Underlying moderate thoracolumbar scoliosis. Left lateral 5th through 7th mildly displaced rib fractures. Left rib marker was placed at the posterior left 8/9 rib level. Other visible osseous structures appear intact. Negative visible bowel gas pattern. Superimposed lower lung volumes with left lung base atelectasis and possible small pleural effusion. No pneumothorax identified. Mediastinal contours remain normal. Visualized tracheal air column is within normal limits. IMPRESSION: 1. Mildly displaced left lateral 5th through 7th rib fractures. 2. Left lung base atelectasis and possible pleural effusion but no pneumothorax identified. Electronically Signed   By: Odessa Fleming M.D.   On: 10/27/2019 01:42    Procedures Procedures (including critical care time)  Medications Ordered in ED Medications  sodium chloride (PF) 0.9 % injection (has no administration in time range)  iohexol (OMNIPAQUE) 300 MG/ML solution 75 mL (has no administration in time range)  ondansetron (ZOFRAN) injection 4 mg (4 mg Intravenous Given 10/27/19 0429)  morphine 4 MG/ML injection 4 mg (4 mg Intravenous Given 10/27/19 0429)    ED Course  I have reviewed the triage vital signs and the nursing notes.  Pertinent labs & imaging results that were available during my care of the patient were reviewed by me and considered in my medical decision making (see chart for details).    MDM Rules/Calculators/A&P                           59 year old male presents with left posterior lateral rib pain and left-sided back pain that has been worsening over the past 3 days after a mechanical fall in his driveway.  No associated head injury.  Patient is neurologically intact, no anterior chest or abdominal pain.  No focal pain over the extremities.  X-ray of the left ribs completed from triage which shows mildly displaced left lateral fifth through seventh rib fractures, there is a possible pleural effusion but no evidence of pneumothorax.  Given significant injury noted on chest x-ray will get CT of the chest, this will also allow [place] to look at the thoracic spine we will also check some basic labs.  IV analgesics given as patient is quite uncomfortable, but fortunately is satting at 100% and is not tachypneic.  Exam is otherwise reassuring without other signs of injury, no indication for head imaging or abdominal imaging.  Lab work is unremarkable aside from mildly elevated glucose of 111, patient with normal hemoglobin and no leukocytosis.  CT of the chest is pending.  Pain has been well managed with IV morphine so far.  Incentive spirometer ordered for patient.  CT of the chest shows fractures of the sixth and seventh ribs, they do not note 1/5 rib fracture there is a very small pleural effusion but no evidence of pneumothorax or other traumatic injury to the chest.  Some cysts noted to the kidney which is an incidental finding.  I discussed these results with the patient.  Will prescribe NSAIDs and narcotic pain medication for pain control.  Discussed strict return precautions and PCP follow-up.  Patient expresses understanding and agreement.  Discharged home in good condition.  Final Clinical Impression(s) / ED Diagnoses Final diagnoses:  Closed fracture of multiple ribs of left side, initial encounter    Rx / DC Orders ED Discharge Orders    None       Legrand Rams 10/27/19 0703    Devoria Albe, MD 10/27/19 [phone]

## 2020-02-01 NOTE — Telephone Encounter (Signed)
 Left msg for patient to callback to complete telephone visit for colon.    Electronically signed by: Tinnie Rosina Core, CMA 02/01/20 1054

## 2021-01-16 NOTE — Telephone Encounter (Signed)
 [facility]  01/16/2021  Call date: 01/16/21 Call number: First call  Does patient have appointment: No  Gaps addressed today:  (AWV)  Comments: lvm for patient to return call to schedule AWV   Electronically signed by: Buford JONELLE Prom, CMA 01/16/2021 3:09 PM    Buford JONELLE Prom, CMA     Electronically signed by: Buford JONELLE Prom, CMA 01/16/21 1509

## 2021-06-28 NOTE — Telephone Encounter (Signed)
 Pt stated [employer] is sched for house call 07/08/21 for medicare wellness visit   Electronically signed by: Emmie LITTIE Brewer, CMA 06/28/21 1535

## 2022-06-13 NOTE — Telephone Encounter (Signed)
 Called pt today to schd a mwv No answer,left vm for pt to rtn call to schd a mwv appt

## 2022-09-18 NOTE — Progress Notes (Signed)
 [address] - AMBULATORY [facility] [address] [place] [address]   Date of Service: 09/18/2022 Patient DOB: 11-20-1960   HPI   Riley Wright presents today for complete physical exam. Patient voices complaints None. Patient presents to office today for routine physical.  Sitting in the office in no acute distress talking in complete sentences.  Denies chest pain, SOB, HA, palpitations, or vision changes.  Feels good overall.  Not taking any medications currently blood pressure is okay in the office today.  Not checking his blood pressure routinely.  No new concerns.  The patient is Divorced and has 2 children. General health since the last visit is described as no complaints. Colorectal cancer screening: No Dental visit within the last year: Yes Eye exam within the last year:  No Hearing: normal. Current diet:  Regular Diet Exercise:  Yes Weight concerns: No Sexually active: no. Concerns about ED: No. Last TDAP:Yes - in the last 10 years. COVID Vaccine: Yes.  Living will and DURABLE POWER OF ATTORNEY for healthcare directives:  No Pt verbalizes Full code status. He is his own POA. does not have a living will in place. He will bring a copy of his living will into office when complete to place into chart. Discussed living will and advanced directives. Explained the different code statuses. Questions answered and handout given for further help/information.  Total time of discussion with staff/provider, documentation and handouts >16 min.  Today's PHQ-2 results: Patient Health Questionnaire-2 Score: 0 (09/18/22 1402) Today's PHQ-9 result:   PHQ-9 Question # 9   Interpretation: PHQ-2 Interpretation: Negative (None-minimal Depression Severity) (09/18/22 1402)   Depression Plan: Normal/Negative Screening   Review of Systems  Constitutional symptoms: negative Eyes:  negative Ear,  nose, throat:  negative Cardiovascular:  negative Respiratory:  negative Gastrointestinal:  negative Genitourinary:  negative Skin:  negative Neurological:  negative Musculoskeletal:  negative Psychiatric:  negative Endocrine:  negative Hematological:  negative Allergic:  negative   Medical History  The following portions of the patient history were reviewed and updated, as appropriate: Allergies, current medications, past medical history, past surgical history, past social history, past family history and problem list Health Maintenance Status       Date Due Completion Dates   DTaP/Tdap/Td Vaccines (1 - Tdap) Never done ---   Colorectal Cancer Screening Never done ---   ZOSTER VACCINE (1 of 2) Never done ---   Diabetes Screening 10/13/2022 10/13/2019   Influenza Vaccine (Season Ended) 11/15/2022 ---   HIV Screening 09/18/2023 (Originally 11/09/1979) ---   Hepatitis C Screening 09/18/2023 (Originally 11/09/1979) ---   COVID-19 Vaccine (3 - 2023-24 season) 09/18/2023 (Originally 12/15/2021) 08/03/2019, 07/13/2019   Comprehensive Annual Visit 09/18/2023 09/18/2022, 09/18/2022 (Done)   Depression Screening 09/18/2023 09/18/2022      History reviewed. No pertinent past medical history. Past Surgical History:  Procedure Laterality Date  . SPINE SURGERY     Procedure: SPINE SURGERY   Family History  Problem Relation Name Age of Onset  . Dementia Mother    . Hypertension Mother    . Diabetes Mother    . Stroke Mother    . High Cholesterol Mother    . Hypertension Father     Social History   Socioeconomic History  . Marital status: Single    Spouse name: None  . Number of children: None  . Years of education: None  . Highest education level:  None  Occupational History  . None  Tobacco Use  . Smoking status: Former    Types: Cigarettes  . Smokeless tobacco: Never  Substance and Sexual Activity  . Alcohol use: Yes  . Drug use: Never  . Sexual activity: None  Other Topics  Concern  . None  Social History Narrative  . None   Social Determinants of Health   Food Insecurity: Low Risk  (09/17/2022)   Food vital sign   . Within the past 12 months, you worried that your food would run out before you got money to buy more: Never true   . Within the past 12 months, the food you bought just didn't last and you didn't have money to get more: Never true  Transportation Needs: No Transportation Needs (09/17/2022)   Transportation   . In the past 12 months, has lack of reliable transportation kept you from medical appointments, meetings, work or from getting things needed for daily living? : No  Safety: Not on file  Living Situation: Low Risk  (09/17/2022)   Living Situation   . What is your living situation today?: I have a steady place to live   . Think about the place you live. Do you have problems with any of the following? Choose all that apply:: None/None on this list   Social History   Tobacco Use  Smoking Status Former  . Types: Cigarettes  Smokeless Tobacco Never   No Known Allergies  Immunization History  Administered Date(s) Administered  . Diphtheria Toxoid, Absorbed 12/08/1964, 02/23/1969  . Pfizer SARS-CoV-2 Primary Series 12+ yrs 07/13/2019, 08/03/2019  . Polio, Unspecified 06/15/1962, 08/10/1962, 12/08/1964  . Smallpox 02/23/1969  . Tetanus Toxoid, Adsorbed 12/08/1964, 02/23/1969    Physical Exam    Vitals:   09/18/22 1402 09/18/22 1403  BP: 143/89 135/85  BP Location:  Left arm  Pulse: 63   Temp: 97.1 F (36.2 C)   TempSrc: Temporal   SpO2: 99%   Weight: 102 kg (224 lb 9.6 oz)   Height: 1.854 m (6' 1)    Constitutional: Well-developed and well-nourished61 y.o. male. Sitting comfortably conversing normally. Pleasant.  Skin: Skin is warm and dry. No rash noted. Eyes: Conjunctivae clear and pink, no pallor, EOM intact. Pupils are equal, round, and reactive to light. Glasses. Bilateral arcus senilis Ears, Mouth, Nose:  External and  internal auditory canals are normal. Tympanic membranes with normal light reflex without erythema. Normal nasal mucosa.  Oropharynx is normal, no erythema or exudates, uvula midline.  Neck: Supple, full range of motion. Non-tender thyroid, no thyromegaly. No JVD. Cardiovascular: +S1S2, regular rate and rhythm, no murmurs, gallops or rubs appreciated. No carotid bruits. Respiratory: Normal effort, clear to auscultation bilaterally. No wheezes, rales or rhonchi noted.  Genitourinary: circumcised. No hernia concerns Gastrointestinal: Bowel sounds present and normal. Soft and nontender to palpation.  No rebound, guarding or fluid wave. No CVA tenderness bilaterally. Musculoskeletal:  Upper and Lower extremities are symmetric with grossly normal muscle strength and tone with normal range of motion.  Peripheral Vascular: No cyanosis, clubbing or edema. Pulses 2+ all 4 extremities. Neuro: Alert and oriented x 3. Cranial nerves II-XII grossly intact. Normal sensation and motor of all extremities. Normal gait. Strength 5/5 in bilateral upper and lower extremities.  Psychiatric: Behavior is Cooperative and Polite. Mood euthymyic. Affect is appropriate.  Assessment/Plan   Normal Male Exam -doing well overall - see orders below  Riley Wright was seen today for annual exam.  Diagnoses and all orders  for this visit:  Annual physical exam  ACP (advance care planning) -     Full code  Screening for endocrine disorder  Screening for lipoid disorders -     Lipid Panel  Screening for blood disease -     Comprehensive Metabolic Panel -     CBC with Differential  Colon cancer screening -     Ambulatory referral to Gastroenterology; Future  Prostate cancer screening -     PSA, Total (Screen)    Patient verbalizes understanding and in agreement with the above plan. All questions answered.  No current outpatient medications on file.   No current facility-administered medications for this visit.    Medication side effects discussed with patient. Advised patient to call clinic or return for visit if these symptoms occur.  Goals of care discussed with patient including med compliance and adequate follow up.  Return in about 1 year (around 09/18/2023) for physical.     I, Elsie Favorite, PA-C, have reviewed the scribe's documentation, personally examined the patient, added my documentation and attest it is accurate and complete.  Electronically signed by: Elsie Fairy Favorite, PA-C 09/18/2022 2:31 PM  This document serves as a record of services personally performed by Elsie Favorite, PA-C,  It was created on their behalf by Gerrie Earnie Giovanni, CMA, a trained medical scribe, and Certified Medical Assistant (CMA). During the course of documenting the history, physical exam and medical decision making, I was functioning as a stage manager. The creation of this record is the provider's dictation and/or activities during the visit.  Electronically signed by Gerrie Earnie Giovanni, CMA 09/18/2022 9:26 AM

## 2022-12-20 NOTE — Progress Notes (Signed)
 GASTROENTEROLOGY OPEN ACCESS SCREENING  PCP PHYSICIAN: Elsie Fairy Favorite, PA-C   HPI: Riley Wright is a 62 y.o. male who I called today to discuss scheduling a  Colonoscopy for a screening. There is no family history of colon cancer or colon polyps. He denies having any GI symptoms.  First colonoscopy  REVIEW OF SYSTEMS: Angina/Chest pain: No Shortness of breath with normal activity: No Weakness or numbness on one side:  No Heartburn: No Dysphagia: No Diarrhea: No Constipation: No Blood in stool: No Abdominal Pain: No  Nausea/Vomiting: No  ANESTHESIA REVIEW: Is BMI >45: No History of pacemaker or defibrillator: No Cardiac history in last 6 months: No If yes, explain: n/a (Acute MI/unstable angina must go to [facility].) Does patient use O2: No Does patient have COPD: No If yes, <3 inhalers: No Does patient have OSA: No  (Patient no longer needs to bring at home CPAP.) History of seizures: No If yes when was last seizure: n/a History of CVA: No If yes, any residual issues? No Does patient have diabetes: No  Does patient have kidney failure: No History of anesthesia complications:No Is patient on greater than three blood pressure medications: No (If yes, patient must go to [facility].) Is patient on anticoagulation: No Is patient on Phentermine: No Is patient on GLP-1 Medication: No (if yes, hold x1 dose prior to procedure.) Does patient use an ambulatory device? No  ALLERGIES: No Known Allergies  MEDICATIONS: No current outpatient medications on file.  PAST MEDICAL HISTORY: Patient Active Problem List  Diagnosis  . Spinal stenosis, lumbar region, with neurogenic claudication    PAST SURGICAL HISTORY: Past Surgical History:  Procedure Laterality Date  . SPINE SURGERY     Procedure: SPINE SURGERY    SOCIAL HISTORY: Social History   Tobacco Use  Smoking Status Former  . Types: Cigarettes  Smokeless Tobacco Never   Social History   Substance and Sexual  Activity  Alcohol Use Yes   Social History   Substance and Sexual Activity  Drug Use Never    FAMILY HISTORY: Per HPI.  VITAL SIGNS:  Height: 6'1 Weight:224lbs  ASSESSMENT: Screening colonoscopy  PLAN 1.) schedule colonoscopy for screening Discussed with patient must have driver at time of procedure.   Preparation: Miralax  Endoscopist: Ronal BIRCH. Marvis, MD Anesthesia plan: Propofol  at [facility]

## 2024-02-14 ENCOUNTER — Emergency Department (HOSPITAL_COMMUNITY)

## 2024-02-14 ENCOUNTER — Inpatient Hospital Stay (HOSPITAL_COMMUNITY)

## 2024-02-14 ENCOUNTER — Encounter (HOSPITAL_COMMUNITY): Payer: Self-pay | Admitting: Radiology

## 2024-02-14 ENCOUNTER — Other Ambulatory Visit: Payer: Self-pay

## 2024-02-14 ENCOUNTER — Inpatient Hospital Stay (HOSPITAL_COMMUNITY)
Admission: EM | Admit: 2024-02-14 | Discharge: 2024-02-26 | DRG: 064 | Disposition: A | Discharge status: Hospice | Attending: Infectious Diseases | Admitting: Infectious Diseases

## 2024-02-14 DIAGNOSIS — R930 Abnormal findings on diagnostic imaging of skull and head, not elsewhere classified: Secondary | ICD-10-CM | POA: Diagnosis not present

## 2024-02-14 DIAGNOSIS — C787 Secondary malignant neoplasm of liver and intrahepatic bile duct: Secondary | ICD-10-CM | POA: Diagnosis present

## 2024-02-14 DIAGNOSIS — D6869 Other thrombophilia: Secondary | ICD-10-CM | POA: Diagnosis not present

## 2024-02-14 DIAGNOSIS — G8191 Hemiplegia, unspecified affecting right dominant side: Secondary | ICD-10-CM | POA: Diagnosis present

## 2024-02-14 DIAGNOSIS — I824Z3 Acute embolism and thrombosis of unspecified deep veins of distal lower extremity, bilateral: Secondary | ICD-10-CM | POA: Diagnosis not present

## 2024-02-14 DIAGNOSIS — Z87891 Personal history of nicotine dependence: Secondary | ICD-10-CM | POA: Diagnosis not present

## 2024-02-14 DIAGNOSIS — R419 Unspecified symptoms and signs involving cognitive functions and awareness: Secondary | ICD-10-CM | POA: Diagnosis present

## 2024-02-14 DIAGNOSIS — D72829 Elevated white blood cell count, unspecified: Secondary | ICD-10-CM | POA: Diagnosis present

## 2024-02-14 DIAGNOSIS — G9389 Other specified disorders of brain: Secondary | ICD-10-CM | POA: Diagnosis not present

## 2024-02-14 DIAGNOSIS — I3139 Other pericardial effusion (noninflammatory): Secondary | ICD-10-CM | POA: Diagnosis present

## 2024-02-14 DIAGNOSIS — C7801 Secondary malignant neoplasm of right lung: Secondary | ICD-10-CM | POA: Diagnosis present

## 2024-02-14 DIAGNOSIS — E785 Hyperlipidemia, unspecified: Secondary | ICD-10-CM | POA: Diagnosis present

## 2024-02-14 DIAGNOSIS — I82443 Acute embolism and thrombosis of tibial vein, bilateral: Secondary | ICD-10-CM | POA: Diagnosis present

## 2024-02-14 DIAGNOSIS — I82453 Acute embolism and thrombosis of peroneal vein, bilateral: Secondary | ICD-10-CM | POA: Diagnosis not present

## 2024-02-14 DIAGNOSIS — I10 Essential (primary) hypertension: Secondary | ICD-10-CM | POA: Diagnosis present

## 2024-02-14 DIAGNOSIS — D696 Thrombocytopenia, unspecified: Secondary | ICD-10-CM | POA: Diagnosis present

## 2024-02-14 DIAGNOSIS — K8689 Other specified diseases of pancreas: Secondary | ICD-10-CM | POA: Diagnosis not present

## 2024-02-14 DIAGNOSIS — Z592 Discord with neighbors, lodgers and landlord: Secondary | ICD-10-CM | POA: Diagnosis not present

## 2024-02-14 DIAGNOSIS — R29810 Facial weakness: Secondary | ICD-10-CM | POA: Diagnosis present

## 2024-02-14 DIAGNOSIS — R4701 Aphasia: Secondary | ICD-10-CM | POA: Diagnosis present

## 2024-02-14 DIAGNOSIS — E8729 Other acidosis: Secondary | ICD-10-CM | POA: Diagnosis present

## 2024-02-14 DIAGNOSIS — D6859 Other primary thrombophilia: Secondary | ICD-10-CM | POA: Diagnosis present

## 2024-02-14 DIAGNOSIS — I2699 Other pulmonary embolism without acute cor pulmonale: Secondary | ICD-10-CM | POA: Diagnosis present

## 2024-02-14 DIAGNOSIS — I63449 Cerebral infarction due to embolism of unspecified cerebellar artery: Secondary | ICD-10-CM | POA: Diagnosis not present

## 2024-02-14 DIAGNOSIS — Z66 Do not resuscitate: Secondary | ICD-10-CM | POA: Diagnosis not present

## 2024-02-14 DIAGNOSIS — R40243 Glasgow coma scale score 3-8, unspecified time: Secondary | ICD-10-CM | POA: Diagnosis not present

## 2024-02-14 DIAGNOSIS — R4182 Altered mental status, unspecified: Secondary | ICD-10-CM | POA: Diagnosis not present

## 2024-02-14 DIAGNOSIS — C259 Malignant neoplasm of pancreas, unspecified: Secondary | ICD-10-CM | POA: Diagnosis present

## 2024-02-14 DIAGNOSIS — N400 Enlarged prostate without lower urinary tract symptoms: Secondary | ICD-10-CM | POA: Diagnosis present

## 2024-02-14 DIAGNOSIS — R29707 NIHSS score 7: Secondary | ICD-10-CM | POA: Diagnosis not present

## 2024-02-14 DIAGNOSIS — C7931 Secondary malignant neoplasm of brain: Secondary | ICD-10-CM | POA: Diagnosis present

## 2024-02-14 DIAGNOSIS — I824Y9 Acute embolism and thrombosis of unspecified deep veins of unspecified proximal lower extremity: Secondary | ICD-10-CM | POA: Diagnosis not present

## 2024-02-14 DIAGNOSIS — I6389 Other cerebral infarction: Secondary | ICD-10-CM | POA: Diagnosis not present

## 2024-02-14 DIAGNOSIS — I38 Endocarditis, valve unspecified: Secondary | ICD-10-CM | POA: Diagnosis not present

## 2024-02-14 DIAGNOSIS — C799 Secondary malignant neoplasm of unspecified site: Secondary | ICD-10-CM | POA: Insufficient documentation

## 2024-02-14 DIAGNOSIS — R160 Hepatomegaly, not elsewhere classified: Secondary | ICD-10-CM | POA: Diagnosis not present

## 2024-02-14 DIAGNOSIS — I6340 Cerebral infarction due to embolism of unspecified cerebral artery: Secondary | ICD-10-CM | POA: Diagnosis not present

## 2024-02-14 DIAGNOSIS — R748 Abnormal levels of other serum enzymes: Secondary | ICD-10-CM | POA: Diagnosis not present

## 2024-02-14 DIAGNOSIS — Z823 Family history of stroke: Secondary | ICD-10-CM

## 2024-02-14 DIAGNOSIS — R569 Unspecified convulsions: Secondary | ICD-10-CM | POA: Diagnosis not present

## 2024-02-14 DIAGNOSIS — I639 Cerebral infarction, unspecified: Secondary | ICD-10-CM | POA: Diagnosis not present

## 2024-02-14 DIAGNOSIS — R7401 Elevation of levels of liver transaminase levels: Secondary | ICD-10-CM | POA: Diagnosis not present

## 2024-02-14 DIAGNOSIS — R29702 NIHSS score 2: Secondary | ICD-10-CM | POA: Diagnosis present

## 2024-02-14 DIAGNOSIS — R1310 Dysphagia, unspecified: Secondary | ICD-10-CM | POA: Diagnosis present

## 2024-02-14 DIAGNOSIS — I82409 Acute embolism and thrombosis of unspecified deep veins of unspecified lower extremity: Secondary | ICD-10-CM | POA: Diagnosis present

## 2024-02-14 DIAGNOSIS — Z7189 Other specified counseling: Secondary | ICD-10-CM | POA: Diagnosis not present

## 2024-02-14 DIAGNOSIS — G8384 Todd's paralysis (postepileptic): Secondary | ICD-10-CM | POA: Diagnosis not present

## 2024-02-14 DIAGNOSIS — Z8249 Family history of ischemic heart disease and other diseases of the circulatory system: Secondary | ICD-10-CM

## 2024-02-14 DIAGNOSIS — I82403 Acute embolism and thrombosis of unspecified deep veins of lower extremity, bilateral: Secondary | ICD-10-CM | POA: Diagnosis not present

## 2024-02-14 DIAGNOSIS — R29705 NIHSS score 5: Secondary | ICD-10-CM | POA: Diagnosis not present

## 2024-02-14 DIAGNOSIS — I69391 Dysphagia following cerebral infarction: Secondary | ICD-10-CM | POA: Diagnosis not present

## 2024-02-14 DIAGNOSIS — Z515 Encounter for palliative care: Secondary | ICD-10-CM | POA: Diagnosis not present

## 2024-02-14 DIAGNOSIS — G939 Disorder of brain, unspecified: Secondary | ICD-10-CM | POA: Diagnosis not present

## 2024-02-14 DIAGNOSIS — R918 Other nonspecific abnormal finding of lung field: Secondary | ICD-10-CM | POA: Diagnosis not present

## 2024-02-14 DIAGNOSIS — I69320 Aphasia following cerebral infarction: Secondary | ICD-10-CM | POA: Diagnosis not present

## 2024-02-14 DIAGNOSIS — I2694 Multiple subsegmental pulmonary emboli without acute cor pulmonale: Secondary | ICD-10-CM | POA: Diagnosis not present

## 2024-02-14 LAB — HEMOGLOBIN A1C
Hgb A1c MFr Bld: 5.1 % (ref 4.8–5.6)
Mean Plasma Glucose: 99.67 mg/dL

## 2024-02-14 LAB — COMPREHENSIVE METABOLIC PANEL WITH GFR
ALT: 55 U/L — ABNORMAL HIGH (ref 0–44)
AST: 60 U/L — ABNORMAL HIGH (ref 15–41)
Albumin: 3.6 g/dL (ref 3.5–5.0)
Alkaline Phosphatase: 181 U/L — ABNORMAL HIGH (ref 38–126)
Anion gap: 17 — ABNORMAL HIGH (ref 5–15)
BUN: 13 mg/dL (ref 8–23)
CO2: 22 mmol/L (ref 22–32)
Calcium: 9.1 mg/dL (ref 8.9–10.3)
Chloride: 98 mmol/L (ref 98–111)
Creatinine, Ser: 1.11 mg/dL (ref 0.61–1.24)
GFR, Estimated: >60 mL/min (ref >60)
Glucose, Bld: 117 mg/dL — ABNORMAL HIGH (ref 70–99)
Potassium: 3.8 mmol/L (ref 3.5–5.1)
Sodium: 137 mmol/L (ref 135–145)
Total Bilirubin: 2.2 mg/dL — ABNORMAL HIGH (ref 0.0–1.2)
Total Protein: 7.2 g/dL (ref 6.5–8.1)

## 2024-02-14 LAB — DIFFERENTIAL
Abs Immature Granulocytes: 0.08 K/uL — ABNORMAL HIGH (ref 0.00–0.07)
Basophils Absolute: 0.1 K/uL (ref 0.0–0.1)
Basophils Relative: 0 %
Eosinophils Absolute: 0.1 K/uL (ref 0.0–0.5)
Eosinophils Relative: 1 %
Immature Granulocytes: 1 %
Lymphocytes Relative: 21 %
Lymphs Abs: 2.5 K/uL (ref 0.7–4.0)
Monocytes Absolute: 1 K/uL (ref 0.1–1.0)
Monocytes Relative: 9 %
Neutro Abs: 8 K/uL — ABNORMAL HIGH (ref 1.7–7.7)
Neutrophils Relative %: 68 %

## 2024-02-14 LAB — BLOOD GAS, VENOUS
Acid-Base Excess: 3.8 mmol/L — ABNORMAL HIGH (ref 0.0–2.0)
Bicarbonate: 29.2 mmol/L — ABNORMAL HIGH (ref 20.0–28.0)
O2 Saturation: 59.1 %
Patient temperature: 37
pCO2, Ven: 46 mmHg (ref 44–60)
pH, Ven: 7.41 (ref 7.25–7.43)
pO2, Ven: 35 mmHg (ref 32–45)

## 2024-02-14 LAB — URINALYSIS, ROUTINE W REFLEX MICROSCOPIC
Bacteria, UA: NONE SEEN
Bilirubin Urine: NEGATIVE
Glucose, UA: NEGATIVE mg/dL
Hgb urine dipstick: NEGATIVE
Ketones, ur: 80 mg/dL — AB
Leukocytes,Ua: NEGATIVE
Nitrite: NEGATIVE
Protein, ur: 30 mg/dL — AB
Specific Gravity, Urine: 1.027 (ref 1.005–1.030)
pH: 5 (ref 5.0–8.0)

## 2024-02-14 LAB — CBC
HCT: 44.8 % (ref 39.0–52.0)
Hemoglobin: 14.6 g/dL (ref 13.0–17.0)
MCH: 29.7 pg (ref 26.0–34.0)
MCHC: 32.6 g/dL (ref 30.0–36.0)
MCV: 91.1 fL (ref 80.0–100.0)
Platelets: 142 K/uL — ABNORMAL LOW (ref 150–400)
RBC: 4.92 MIL/uL (ref 4.22–5.81)
RDW: 12.6 % (ref 11.5–15.5)
WBC: 11.8 K/uL — ABNORMAL HIGH (ref 4.0–10.5)
nRBC: 0 % (ref 0.0–0.2)

## 2024-02-14 LAB — APTT: aPTT: 29 s (ref 24–36)

## 2024-02-14 LAB — LACTIC ACID, PLASMA: Lactic Acid, Venous: 1.5 mmol/L (ref 0.5–1.9)

## 2024-02-14 LAB — BETA-HYDROXYBUTYRIC ACID: Beta-Hydroxybutyric Acid: 1.63 mmol/L — ABNORMAL HIGH (ref 0.05–0.27)

## 2024-02-14 LAB — I-STAT CHEM 8, ED
BUN: 15 mg/dL (ref 8–23)
Calcium, Ion: 1.07 mmol/L — ABNORMAL LOW (ref 1.15–1.40)
Chloride: 100 mmol/L (ref 98–111)
Creatinine, Ser: 0.9 mg/dL (ref 0.61–1.24)
Glucose, Bld: 115 mg/dL — ABNORMAL HIGH (ref 70–99)
HCT: 46 % (ref 39.0–52.0)
Hemoglobin: 15.6 g/dL (ref 13.0–17.0)
Potassium: 3.8 mmol/L (ref 3.5–5.1)
Sodium: 137 mmol/L (ref 135–145)
TCO2: 24 mmol/L (ref 22–32)

## 2024-02-14 LAB — ETHANOL: Alcohol, Ethyl (B): <15 mg/dL (ref <15)

## 2024-02-14 LAB — CBG MONITORING, ED: Glucose-Capillary: 108 mg/dL — ABNORMAL HIGH (ref 70–99)

## 2024-02-14 LAB — PROTIME-INR
INR: 1.3 — ABNORMAL HIGH (ref 0.8–1.2)
Prothrombin Time: 16.5 s — ABNORMAL HIGH (ref 11.4–15.2)

## 2024-02-14 LAB — TSH: TSH: 0.829 u[IU]/mL (ref 0.350–4.500)

## 2024-02-14 LAB — HIV ANTIBODY (ROUTINE TESTING W REFLEX): HIV Screen 4th Generation wRfx: NONREACTIVE

## 2024-02-14 MED ORDER — ENOXAPARIN SODIUM 40 MG/0.4ML IJ SOSY: 40.0000 mg | PREFILLED_SYRINGE | INTRAMUSCULAR | Status: DC

## 2024-02-14 MED ORDER — STROKE: EARLY STAGES OF RECOVERY BOOK
Freq: Once | Status: AC
  Filled 2024-02-14: qty 1

## 2024-02-14 MED ORDER — CLOPIDOGREL BISULFATE 75 MG PO TABS
75.0000 mg | ORAL_TABLET | Freq: Every day | ORAL | Status: DC
  Administered 2024-02-14: 75 mg via ORAL
  Filled 2024-02-14: qty 1

## 2024-02-14 MED ORDER — IOHEXOL 350 MG/ML SOLN
75.0000 mL | Freq: Once | INTRAVENOUS | Status: AC | PRN
  Administered 2024-02-14: 75 mL via INTRAVENOUS

## 2024-02-14 MED ORDER — SODIUM CHLORIDE 0.9% FLUSH
3.0000 mL | Freq: Once | INTRAVENOUS | Status: AC
  Administered 2024-02-14: 3 mL via INTRAVENOUS

## 2024-02-14 MED ORDER — ASPIRIN 81 MG PO TBEC
81.0000 mg | DELAYED_RELEASE_TABLET | Freq: Every day | ORAL | Status: DC
  Administered 2024-02-14: 81 mg via ORAL
  Filled 2024-02-14: qty 1

## 2024-02-14 NOTE — ED Triage Notes (Signed)
 Son stated, He has not eaten and has not answered questions appropriately in the last couple. The last day normal  for the son was last Friday. Ive not seen him hardly any.. I called paramedics and tried to get him to go but refused and told to bring him to ER immediately. Pt was alert non oriented to place, DOB,

## 2024-02-14 NOTE — Progress Notes (Addendum)
 Radiology paged with critical imaging results.   New findings of left sided pulmonary emboli with splenic infects and concerns of liver mets and pancreatic neoplasm.  Patient is currently with oxygen saturation at 100% on room air and patient is currently stable from a respiratory standpoint.  Discussed results with neurology.   In conversation with neurology, there is some concern for small petechial hemorrhage, so will hold off on heparin initiation at this time.  Will plan for CT head in the AM, and based on results, will decide to start heparin then.   Will stop the aspirin and plavix for tomorrow in anticipation of starting heparin. Already had dose for today. NPO midnight just if patient needs any procedures.

## 2024-02-14 NOTE — H&P (Signed)
 Date: 02/14/2024               Patient Name:  Riley Wright MRN: [id number]  DOB: January 11, 1961 Age / Sex: 63 y.o., male   PCP: Patient, No Pcp Per         Medical Service: Internal Medicine Teaching Service         Attending Physician: Dr. Rosan, Dayton BROCKS, DO      First Contact: Melvenia Morrison, MD  Pager:  [phone]  Second Contact: Dr. Damien Lease, DO  Pager:  [phone]       After Hours  (After 5pm / First Contact Pager: [phone]  weekends / holidays): Second Contact Pager: [phone]   SUBJECTIVE   Chief Complaint: Embolic stroke on Wright  History of Present Illness: Riley Wright is a 63 y.o. male with no pertinent past medical history, but also limited use of primary care services, presented after his family noticed that his speech and thought process were abnormal. His son reports that he first noticed that his father's speech was abnormal when he started talking about a refrigerator bill on Wednesday. He lives with his son Riley Wright who had noticed that he had not been eating as much as normal since Monday, and then noticed continued worsening comprehension and speech throughout the week. They note that he was not complaining of any pain, fevers, chills, chest pain, shortness of breath, numbness, tingling, palpitations, abdominal pain, dizziness, dysphagia, double vision, falls, or headache. Denies any recent 3 years.  The patient is currently intermittently comprehending instructions and often responds with responses that don't make sense. His family notes that his responses have not made sense for 2 days. This limits the history but his family is able to fill in some of the history as above.  ED Course: Vitals were significant for a mild hypertension to 154/99, which has improved Labs significant for leukocytosis to 11.8. Elevated transaminases to 60 (AST) and 55 (ALT). Elevated ASP to 181, bilirubin to 2.2, and INR to 1.3. Platelets reduced to 142. UA with ketones and protein but  non-infectious. CT head showed subacute ischemic infarct in the left thalamus, and multiple areas or age indeterminate infarction within the cerebellar hemispheres bilaterally. Wright showed numerous acute ischemic foci throughout both cerebral and cerebellar hemispheres, with greater burden on the left which is consistent with an embolic process. And also T2 hyperintensities consistent with small vessel disease. Consulted neurology, recommendations below.  Meds:  Family reports that the patient is not taking medication.   Past Medical History Past Medical History:  Diagnosis Date   Arthritis    bil hips   Heart murmur     Past Surgical History Past Surgical History:  Procedure Laterality Date   LUMBAR LAMINECTOMY/DECOMPRESSION MICRODISCECTOMY Right 01/26/2015   Procedure: LUMBAR DECOMPRESSION L4-5 CENTRAL AND TO THE RIGHT;  Surgeon: Tanda Heading, MD;  Location: [facility];  Service: Orthopedics;  Laterality: Right;    Social:  Lives With: Son  Support: Family Level of Function: Independent  PCP: [facility]? Family unsure, last saw Elsie Favorite on 09/18/2022 Substances: -Tobacco: Former: 1 pack per day for unclear amount of time. -Alcohol: A pint of alcohol on the weekends. Family state that he is not a heavy drinker.  -Recreational Drug: None   Family History:  Family History  Problem Relation Age of Onset   Stroke Mother    Hypertension Mother    Hypertension Father   Mother: Dementia   Allergies: Allergies as of 02/14/2024   (  No Known Allergies)    Review of Systems: A complete ROS was negative except as per HPI.   OBJECTIVE:   Physical Exam: Blood pressure 127/85, pulse 74, temperature 99 F (37.2 C), temperature source Oral, resp. rate 16, SpO2 100%.  Constitutional: alert, well-appearing sitting in , in no acute distress HENT: normocephalic atraumatic, mucous membranes moist Eyes: conjunctiva non-erythematous Cardiovascular: regular rate and rhythm, no  m/r/g Pulmonary/Chest: normal work of breathing on room air, lungs clear to auscultation bilaterally Abdominal: bowel sounds normal. Hepatomegaly present, soft, non-tender, non-distended MSK: normal bulk and tone Neurological: alert & oriented x 3, 5/5 strength in bilateral upper and lower extremities. Normal sensation bilaterally. Intermittently follows commands. Unable count backwards but able to count forwards. Unable to spell cat forwards or backwards. Finger-nose testing intact. Unable to clap when recognizing the letter A in a series of letters. Skin: warm and dry Psych: Speech non-linear and slowed.  Labs: CBC    Component Value Date/Time   WBC 11.8 (H) 02/14/2024 1024   RBC 4.92 02/14/2024 1024   HGB 15.6 02/14/2024 1028   HCT 46.0 02/14/2024 1028   PLT 142 (L) 02/14/2024 1024   MCV 91.1 02/14/2024 1024   MCH 29.7 02/14/2024 1024   MCHC 32.6 02/14/2024 1024   RDW 12.6 02/14/2024 1024   LYMPHSABS 2.5 02/14/2024 1024   MONOABS 1.0 02/14/2024 1024   EOSABS 0.1 02/14/2024 1024   BASOSABS 0.1 02/14/2024 1024     CMP     Component Value Date/Time   NA 137 02/14/2024 1028   K 3.8 02/14/2024 1028   CL 100 02/14/2024 1028   CO2 22 02/14/2024 1024   GLUCOSE 115 (H) 02/14/2024 1028   BUN 15 02/14/2024 1028   CREATININE 0.90 02/14/2024 1028   CALCIUM 9.1 02/14/2024 1024   PROT 7.2 02/14/2024 1024   ALBUMIN 3.6 02/14/2024 1024   AST 60 (H) 02/14/2024 1024   ALT 55 (H) 02/14/2024 1024   ALKPHOS 181 (H) 02/14/2024 1024   BILITOT 2.2 (H) 02/14/2024 1024   GFRNONAA >60 02/14/2024 1024   GFRAA >60 10/27/2019 0432    Imaging:  US  Abdomen Limited RUQ (LIVER/GB) EXAM: Right Upper Quadrant Abdominal Ultrasound 02/14/2024 05:10:53 PM  TECHNIQUE: Real-time ultrasonography of the right upper quadrant of the abdomen was performed.  COMPARISON: None available.  CLINICAL HISTORY: Elevated AST (SGOT) 622358; 394831 Elevated ALT measurement  [id number].  FINDINGS:  LIVER: Multiple solid-appearing hepatic masses, the largest in the right hepatic lobe measuring up to 4.2 cm and the largest in the left hepatic lobe measuring 3.6 cm. Findings concerning for metastases. No intrahepatic biliary ductal dilatation.  BILIARY SYSTEM: Gallbladder not definitively visualized. Common bile duct is within normal limits measuring .  RIGHT KIDNEY: The right kidney is grossly unremarkable in appearances without evidence of hydronephrosis, echogenic calculi or worrisome mass lesions.  PANCREAS: Visualized portions of the pancreas are unremarkable.  OTHER: No right upper quadrant ascites.  IMPRESSION: 1. Multiple solid-appearing hepatic masses, largest 4.2 cm in the right lobe and 3.6 cm in the left lobe, concerning for metastases. . 2. Gallbladder not definitively visualized.  Electronically signed by: Franky Crease MD 02/14/2024 05:27 PM EDT RP Workstation: [id number] CT ANGIO HEAD NECK W WO CM EXAM: CTA HEAD AND NECK WITH CONTRAST 02/14/2024 01:34:00 PM  TECHNIQUE: CTA of the head and neck was performed with the administration of 75 mL of iohexol  (OMNIPAQUE ) 350 MG/ML injection. Multiplanar 2D and/or 3D reformatted images are provided for review. Automated exposure control, iterative  reconstruction, and/or weight based adjustment of the mA/kV was utilized to reduce the radiation dose to as low as reasonably achievable. Stenosis of the internal carotid arteries measured using NASCET criteria.  COMPARISON: brain Wright 02/14/2024.  CLINICAL HISTORY: Stroke/TIA, determine embolic source.  FINDINGS:  CTA NECK: AORTIC ARCH AND ARCH VESSELS: No dissection or arterial injury. No significant stenosis of the brachiocephalic or subclavian arteries.  CERVICAL CAROTID ARTERIES: Minimal atherosclerotic calcification at the left carotid bifurcation. No hemodynamically significant stenosis of either carotid system by NASCET criteria.  No dissection or arterial injury.  CERVICAL VERTEBRAL ARTERIES: Vertebral arteries are right dominant. No dissection, arterial injury, or significant stenosis.  LUNGS AND MEDIASTINUM: Unremarkable.  SOFT TISSUES: No acute abnormality.  BONES: No acute abnormality.  CTA HEAD: ANTERIOR CIRCULATION: No significant stenosis of the internal carotid arteries. No significant stenosis of the anterior cerebral arteries. No significant stenosis of the middle cerebral arteries. No aneurysm.  POSTERIOR CIRCULATION: No significant stenosis of the posterior cerebral arteries. No significant stenosis of the basilar artery. No significant stenosis of the vertebral arteries. No aneurysm.  OTHER: No dural venous sinus thrombosis on this non-dedicated study.  IMPRESSION: 1. No large vessel occlusion, hemodynamically significant stenosis, or aneurysm in the head or neck. 2. Minimal atherosclerotic calcification at the left carotid bifurcation without hemodynamically significant stenosis. 3. Essentially no atherosclerotic disease of the visualized craniocervical arteries consider cardiac source/arrhythmia as a potential source of embolism infarcts. Correlation with echocardiogram suggested.  Electronically signed by: Franky Stanford MD 02/14/2024 02:42 PM EDT RP Workstation: [id number] MR BRAIN WO CONTRAST EXAM: Wright BRAIN WITHOUT CONTRAST 02/14/2024 01:03:36 PM  TECHNIQUE: Multiplanar multisequence Wright of the head/brain was performed without the administration of intravenous contrast.  COMPARISON: None available.  CLINICAL HISTORY:  FINDINGS:  BRAIN AND VENTRICLES: There are numerous foci of acute ischemia throughout both cerebral hemispheres and cerebellar hemispheres. The largest lesion is in the left thalamus. The burden of lesions is greater on the left than the right. Scattered chronic microhemorrhages in the peripheral cerebral hemispheres. Multifocal hyperintense  T2-weighted signal within the cerebral white matter, most commonly due to chronic small vessel disease. No mass. No midline shift. No hydrocephalus. The sella is unremarkable. Normal flow voids.  ORBITS: No acute abnormality.  SINUSES AND MASTOIDS: No acute abnormality.  BONES AND SOFT TISSUES: Normal marrow signal. No acute soft tissue abnormality.  IMPRESSION: 1. Numerous acute ischemic foci throughout both cerebral and cerebellar hemispheres, with greater burden on the left. This is consistent with an embolic process from a cardiac or proximal aortic source. 2. Multifocal cerebral white matter T2 hyperintensities consistent with chronic small vessel disease.  Electronically signed by: Franky Stanford MD 02/14/2024 02:38 PM EDT RP Workstation: [id number] CT HEAD WO CONTRAST EXAM: CT HEAD WITHOUT CONTRAST 02/14/2024 11:16:00 AM  TECHNIQUE: CT of the head was performed without the administration of intravenous contrast. Automated exposure control, iterative reconstruction, and/or weight based adjustment of the mA/kV was utilized to reduce the radiation dose to as low as reasonably achievable.  COMPARISON: None available.  CLINICAL HISTORY: Memory loss.  FINDINGS:  BRAIN AND VENTRICLES: No acute hemorrhage. There is an ovoid area of diminished attenuation present anteriorly within the left thalamus concerning for subacute ischemic infarct. There is also a focal area of diminished attenuation in the subcortical white matter of the left posterior temporal lobe seen on image 16 of series 3, which is concerning for subcortical infarct. There are also multiple areas of age indeterminate infarction within the cerebellar hemispheres bilaterally,  worse on the left. There is no significant cerebral or cerebellar swelling. There is mild cerebral white matter disease. No hydrocephalus. No extra-axial collection. No mass effect or midline shift.  ORBITS: No acute  abnormality.  SINUSES: No acute abnormality.  SOFT TISSUES AND SKULL: No acute soft tissue abnormality. No skull fracture.  IMPRESSION: 1. Ovoid area of diminished attenuation in the left thalamus, concerning for subacute ischemic infarct. 2. Focal area of diminished attenuation in the subcortical white matter of the left posterior temporal lobe, concerning for subcortical infarct. 3. Multiple areas of age indeterminate infarction within the cerebellar hemispheres bilaterally, worse on the left. 4. Correlation with Wright of the brain and CT angiogram of the head and neck is suggested.  Electronically signed by: Evalene Coho MD 02/14/2024 11:29 AM EDT RP Workstation: [id number]   EKG: Sinus rhythm with PVCs  ASSESSMENT & PLAN:   Assessment & Plan by Problem: Principal Problem:   Stroke (HCC) Active Problems:   Elevated ALT measurement   Elevated AST (SGOT)   Increased anion gap metabolic acidosis   Leukocytosis   Liver masses   KRU ALLMAN is a 63 y.o. person living with no known PMH who presented with acute change in speech and admitted for workup for embolic stroke.  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 Presented with deficits in comprehension and speech. No motor or sensory deficits. CN II-XII normal on my exam but neurology did note drooping of the left side of his face. CT showed subacute left thalamus infarct and multiple other infarcts.Riley Wright showed numerous ischemic foci throughout cerebellar and cerebral hemispheres, concerning for embolic source. CTA showed minimal atherosclerotic calcification at the left carotid bifurcation which was not hemodynamically significant. Differential includes cardiac emboli vs septic emboli. Neurology have seen the patient, and their recommendations are below. Given potential diagnosis of liver metastasis, most likely  emboli from a hypercoagulable state.  -Neurology recommended workup for septic emboli  -HgbA1C, fasting lipid panel  -Frequent neuro checks  -Bilateral lower extremity venous duplex  -TTE, may need TEE   -Aspirin 81mg  and Plavix 75mg  for three weeks followed by aspirin only.  -Telemetry -Defer starting statin for now due to abnormal liver enzymes. -PT/OT/SLP consult  #Solid hepatic masses #Transaminitis No history of heavy alcohol use or IVDU per family. Hepatomegaly on physical exam. Elevated transaminases, bilirubin, and ALP. Synthetic function decreased as evidenced by elevated INR and low platelets. Due to hepatomegaly and elevated transaminases, liver ultrasound was ordered which revealed multiple solid-appearing hepatic masses, largest 4.2 cm in the right lobe and 3.6 cm in the left lobe, concerning for metastases. If these are carcinogenic, this may explain his prothrombotic state that caused the multiple embolic strokes. We will hold statin for now in the setting of his transaminitis.  -CMP/INR/CBC tomorrow -CT chest abdomen pelvis with contrast for staging of potential metastatic disease. -Monitor for signs of bleeding. -Consider non-emergent GI consult in the morning -Clear liquid diet pending potential GI procedure  #Elevated anion gap #Poor PO intake #Ketonuria Patient had reportedly not been eating a lot for the past week. No nausea, vomiting, diarrhea, or other GI symptoms. Anion gap elevated to 17 with low normal bicarbonate at 22. Ketones in urine. Could also be in the setting of ischemic infarcts. VBG showed pH of 7.41.  -Lactic acid -Beta-hydroxybutyrate -Trend CMP  #Leukocytosis  Elevated to 11.8. Paucity of infectious symptoms. Afebrile and hemodynamically stable. Possible  reactive to ischemic emboli or in the setting of his malignancy.  -Trend CBC  Diet: Clear liquid VTE: Enoxaparin Code: Full Surrogate Decision Maker: Sons  Prior to Admission Living  Arrangement: Home, living son Riley Wright Anticipated Discharge Location: Home Barriers to Discharge: Ongoing workup  Dispo: Admit patient to Inpatient with expected length of stay greater than 2 midnights.  Signed: Napoleon Limes, MD Internal Medicine Resident PGY-1 02/14/2024, 6:09 PM   Please contact IM Residency On-Call Pager at: [phone] or [phone].

## 2024-02-14 NOTE — Hospital Course (Addendum)
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 Hepatomegaly on physical exam. Elevated transaminases, bilirubin, and ALP. Synthetic function decreased as evidenced by elevated INR and low platelets. Due to hepatomegaly and elevated transaminases, liver ultrasound was ordered which revealed multiple solid-appearing hepatic masses, largest 4.2 cm in the right lobe and 3.6 cm in the left lobe, concerning for metastases. Follow-up CT scan of the chest abdomen and pelvis showed 4.9 x 2.4 cm hypodense mass within the pancreatic body, highly concerning for pancreatic neoplasm with metastasis to the lung and liver. IR consulted for percutaneous liver biopsy, but he had to wait multiple days prior to biopsy due to recent aspirin use. The biopsy was completed without complication on 11/7, and the pathology report showed ***.   #Pulmonary embolus No increased work of breathing, saturating well on room air. Incidental left-sided pulmonary emboli without evidence of right heart strain found on chest CT for staging of the above neoplasm. 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 Presented with deficits in comprehension and speech. Mild right sided weakness per neurology. CT showed subacute left thalamus infarct and multiple other infarcts. MRI showed numerous ischemic foci throughout cerebellar and cerebral hemispheres, concerning for embolic source. CTA showed minimal atherosclerotic calcification at the left carotid bifurcation which was not hemodynamically significant. MRI brain with contrast concerning for brain metastisis as well as ischemic infarcts.  TTE was not performed as the patient was already on heparin for his PE. During his stay, he showed no*** improvement in his new symptoms and continued with some expressive aphasia.  #Starvation ketosis #Poor PO intake #Ketonuria Patient had reportedly not been eating a lot for the past week prior to admission. No nausea, vomiting, diarrhea, or other GI symptoms. Anion gap elevated to 17 with low normal bicarbonate at 22. Ketones in urine. VBG showed pH of 7.41. BHB elevated to 1.63 and lactic acid normal. Was started on regular diet.  #Leukocytosis  Elevated to 11.8 on admission. Paucity of infectious symptoms. Afebrile and hemodynamically stable. Possible reactive to ischemic emboli or in the setting of his malignancy.

## 2024-02-14 NOTE — ED Notes (Signed)
 Patient transported to CT

## 2024-02-14 NOTE — ED Provider Notes (Signed)
 [place] EMERGENCY DEPARTMENT AT [facility] Provider Note   CSN: [id number] Arrival date & time: 02/14/24  1007     Patient presents with: Altered Mental Status and Not eating   Riley Wright is a 63 y.o. male.   HPI Patient arrives accompanied by 2 adult sons who assist with history. Patient was last normal 1 week ago.  Family numbers note that they have interacted with them over the past few days, though verbally, without substantial physical interaction. They note that over the past 2 days he has had some episodes of speech, atypical behavior, and today was found with much more abnormal speech, persistent change in activity prompting ED eval. Noted over the past week patient has been essentially withdrawn, staying in his room, has not been eating.  No report of fall, trauma, new medications. The patient himself states that he feels okay, denies feeling weaker, different from normal. Family members note that the patient has new speech difficulty, as well as the aforementioned change in behavior.    Prior to Admission medications   Not on File    Allergies: Patient has no known allergies.    Review of Systems  Updated Vital Signs BP 127/85   Pulse 74   Temp 99 F (37.2 C) (Oral)   Resp 16   SpO2 100%   Physical Exam Vitals and nursing note reviewed.  Constitutional:      General: He is not in acute distress.    Appearance: He is well-developed.  HENT:     Head: Normocephalic and atraumatic.  Eyes:     Conjunctiva/sclera: Conjunctivae normal.  Cardiovascular:     Rate and Rhythm: Normal rate and regular rhythm.  Pulmonary:     Effort: Pulmonary effort is normal. No respiratory distress.     Breath sounds: No stridor.  Abdominal:     General: There is no distension.  Skin:    General: Skin is warm and dry.  Neurological:     Mental Status: He is alert.     Comments: Word-finding difficulty evident.  Trace right facial droop with smiling.   Strength in extremities symmetric 4/5 upper, lower.  Psychiatric:        Attention and Perception: He is inattentive.        Cognition and Memory: Cognition is impaired.     (all labs ordered are listed, but only abnormal results are displayed) Labs Reviewed  PROTIME-INR - Abnormal; Notable for the following components:      Result Value   Prothrombin Time 16.5 (*)    INR 1.3 (*)    All other components within normal limits  CBC - Abnormal; Notable for the following components:   WBC 11.8 (*)    Platelets 142 (*)    All other components within normal limits  DIFFERENTIAL - Abnormal; Notable for the following components:   Neutro Abs 8.0 (*)    Abs Immature Granulocytes 0.08 (*)    All other components within normal limits  COMPREHENSIVE METABOLIC PANEL WITH GFR - Abnormal; Notable for the following components:   Glucose, Bld 117 (*)    AST 60 (*)    ALT 55 (*)    Alkaline Phosphatase 181 (*)    Total Bilirubin 2.2 (*)    Anion gap 17 (*)    All other components within normal limits  URINALYSIS, ROUTINE W REFLEX MICROSCOPIC - Abnormal; Notable for the following components:   Color, Urine AMBER (*)    APPearance HAZY (*)  Ketones, ur 80 (*)    Protein, ur 30 (*)    All other components within normal limits  CBG MONITORING, ED - Abnormal; Notable for the following components:   Glucose-Capillary 108 (*)    All other components within normal limits  I-STAT CHEM 8, ED - Abnormal; Notable for the following components:   Glucose, Bld 115 (*)    Calcium, Ion 1.07 (*)    All other components within normal limits  CULTURE, BLOOD (ROUTINE X 2)  CULTURE, BLOOD (ROUTINE X 2)  APTT  ETHANOL  RAPID URINE DRUG SCREEN, HOSP PERFORMED  HEMOGLOBIN A1C  HIV ANTIBODY (ROUTINE TESTING W REFLEX)  BETA-HYDROXYBUTYRIC ACID  BLOOD GAS, VENOUS  CBG MONITORING, ED    EKG: EKG Interpretation Date/Time:  Friday February 14 2024 10:14:12 EDT Ventricular Rate:  92 PR Interval:  142 QRS  Duration:  80 QT Interval:  352 QTC Calculation: 435 R Axis:   77  Text Interpretation: Sinus rhythm with occasional Premature ventricular complexes ST-t wave abnormality Non-specific intra-ventricular conduction delay Abnormal ECG Confirmed by Garrick Charleston [phone]) on 02/14/2024 10:42:37 AM  Radiology: CT ANGIO HEAD NECK W WO CM Result Date: 02/14/2024 EXAM: CTA HEAD AND NECK WITH CONTRAST 02/14/2024 01:34:00 PM TECHNIQUE: CTA of the head and neck was performed with the administration of 75 mL of iohexol  (OMNIPAQUE ) 350 MG/ML injection. Multiplanar 2D and/or 3D reformatted images are provided for review. Automated exposure control, iterative reconstruction, and/or weight based adjustment of the mA/kV was utilized to reduce the radiation dose to as low as reasonably achievable. Stenosis of the internal carotid arteries measured using NASCET criteria. COMPARISON: brain MRI 02/14/2024. CLINICAL HISTORY: Stroke/TIA, determine embolic source. FINDINGS: CTA NECK: AORTIC ARCH AND ARCH VESSELS: No dissection or arterial injury. No significant stenosis of the brachiocephalic or subclavian arteries. CERVICAL CAROTID ARTERIES: Minimal atherosclerotic calcification at the left carotid bifurcation. No hemodynamically significant stenosis of either carotid system by NASCET criteria. No dissection or arterial injury. CERVICAL VERTEBRAL ARTERIES: Vertebral arteries are right dominant. No dissection, arterial injury, or significant stenosis. LUNGS AND MEDIASTINUM: Unremarkable. SOFT TISSUES: No acute abnormality. BONES: No acute abnormality. CTA HEAD: ANTERIOR CIRCULATION: No significant stenosis of the internal carotid arteries. No significant stenosis of the anterior cerebral arteries. No significant stenosis of the middle cerebral arteries. No aneurysm. POSTERIOR CIRCULATION: No significant stenosis of the posterior cerebral arteries. No significant stenosis of the basilar artery. No significant stenosis of the  vertebral arteries. No aneurysm. OTHER: No dural venous sinus thrombosis on this non-dedicated study. IMPRESSION: 1. No large vessel occlusion, hemodynamically significant stenosis, or aneurysm in the head or neck. 2. Minimal atherosclerotic calcification at the left carotid bifurcation without hemodynamically significant stenosis. 3. Essentially no atherosclerotic disease of the visualized craniocervical arteries consider cardiac source/arrhythmia as a potential source of embolism infarcts. Correlation with echocardiogram suggested. Electronically signed by: Franky Stanford MD 02/14/2024 02:42 PM EDT RP Workstation: [id number]   MR BRAIN WO CONTRAST Result Date: 02/14/2024 EXAM: MRI BRAIN WITHOUT CONTRAST 02/14/2024 01:03:36 PM TECHNIQUE: Multiplanar multisequence MRI of the head/brain was performed without the administration of intravenous contrast. COMPARISON: None available. CLINICAL HISTORY: FINDINGS: BRAIN AND VENTRICLES: There are numerous foci of acute ischemia throughout both cerebral hemispheres and cerebellar hemispheres. The largest lesion is in the left thalamus. The burden of lesions is greater on the left than the right. Scattered chronic microhemorrhages in the peripheral cerebral hemispheres. Multifocal hyperintense T2-weighted signal within the cerebral white matter, most commonly due to chronic small vessel disease. No mass.  No midline shift. No hydrocephalus. The sella is unremarkable. Normal flow voids. ORBITS: No acute abnormality. SINUSES AND MASTOIDS: No acute abnormality. BONES AND SOFT TISSUES: Normal marrow signal. No acute soft tissue abnormality. IMPRESSION: 1. Numerous acute ischemic foci throughout both cerebral and cerebellar hemispheres, with greater burden on the left. This is consistent with an embolic process from a cardiac or proximal aortic source. 2. Multifocal cerebral white matter T2 hyperintensities consistent with chronic small vessel disease. Electronically signed by:  Franky Stanford MD 02/14/2024 02:38 PM EDT RP Workstation: [id number]   CT HEAD WO CONTRAST Result Date: 02/14/2024 EXAM: CT HEAD WITHOUT CONTRAST 02/14/2024 11:16:00 AM TECHNIQUE: CT of the head was performed without the administration of intravenous contrast. Automated exposure control, iterative reconstruction, and/or weight based adjustment of the mA/kV was utilized to reduce the radiation dose to as low as reasonably achievable. COMPARISON: None available. CLINICAL HISTORY: Memory loss. FINDINGS: BRAIN AND VENTRICLES: No acute hemorrhage. There is an ovoid area of diminished attenuation present anteriorly within the left thalamus concerning for subacute ischemic infarct. There is also a focal area of diminished attenuation in the subcortical white matter of the left posterior temporal lobe seen on image 16 of series 3, which is concerning for subcortical infarct. There are also multiple areas of age indeterminate infarction within the cerebellar hemispheres bilaterally, worse on the left. There is no significant cerebral or cerebellar swelling. There is mild cerebral white matter disease. No hydrocephalus. No extra-axial collection. No mass effect or midline shift. ORBITS: No acute abnormality. SINUSES: No acute abnormality. SOFT TISSUES AND SKULL: No acute soft tissue abnormality. No skull fracture. IMPRESSION: 1. Ovoid area of diminished attenuation in the left thalamus, concerning for subacute ischemic infarct. 2. Focal area of diminished attenuation in the subcortical white matter of the left posterior temporal lobe, concerning for subcortical infarct. 3. Multiple areas of age indeterminate infarction within the cerebellar hemispheres bilaterally, worse on the left. 4. Correlation with MRI of the brain and CT angiogram of the head and neck is suggested. Electronically signed by: Evalene Coho MD 02/14/2024 11:29 AM EDT RP Workstation: [id number]     Procedures   Medications Ordered in the ED    stroke: early stages of recovery book (has no administration in time range)  aspirin EC tablet 81 mg (81 mg Oral Given 02/14/24 1341)  clopidogrel (PLAVIX) tablet 75 mg (75 mg Oral Given 02/14/24 1341)  sodium chloride  flush (NS) 0.9 % injection 3 mL (3 mLs Intravenous Given 02/14/24 1101)  iohexol  (OMNIPAQUE ) 350 MG/ML injection 75 mL (75 mLs Intravenous Contrast Given 02/14/24 1335)                                    Medical Decision Making Elderly male with no substantial medical history presents with speech difficulty, facial asymmetry, and behavioral changes.  Last reportedly normal interaction with family member as well as possibly as long as a week ago, and patient is out of window for TNK, but with concern for stroke, acute encephalopathy or other phenomenon, including infection, Polite abnormalities, patient had labs and CT MR monitoring. Cardiac 90 sinus normal pulse ox 100% room air normal  Amount and/or Complexity of Data Reviewed Independent Historian:     Details: 2 adult children External Data Reviewed: ECG.    Details: Last ECG 10 years ago Labs: ordered. Decision-making details documented in ED Course. Radiology: ordered and independent interpretation performed. Decision-making details  documented in ED Course. ECG/medicine tests: ordered and independent interpretation performed. Decision-making details documented in ED Course.  Risk Prescription drug management. Decision regarding hospitalization. Diagnosis or treatment significantly limited by social determinants of health.  Update: I discussed the patient's presentation with family members at bedside.  Update: I discussed patient's case with our neurology colleagues given abnormal CT, imaging pending.  Patient found to have multiple stroke MRI, concern for embolic source 3:46 PM Patient awaiting bed upstairs, case has been discussed with our admitting team.    Final diagnoses:  Acute CVA (cerebrovascular  accident) [facility])    ED Discharge Orders     None          Garrick Charleston, MD 02/14/24 1547

## 2024-02-14 NOTE — Consult Note (Signed)
 NEUROLOGY CONSULT NOTE   Date of service: February 14, 2024 Patient Name: Riley Wright MRN:  [id number] DOB:  November 11, 1960 Chief Complaint: Stroke on CT Requesting Provider: Garrick Charleston, MD  History of Present Illness  Riley Wright is a 63 y.o. male with hx of lumbar decompression presenting with altered speech and altered behavior.  He spoke with his brother on Monday and told him he was not feeling well.  He does live with his son who noted that he was not speaking correctly on Wednesday.  States he has been just been laying around and had not eaten so he was brought to the hospital today.  Denies falls, trauma or new medications.  Today he is date taking that he is feeling better.  Denies weakness.   LKW: Wednesday  Modified rankin score: 0-Completely asymptomatic and back to baseline post- stroke IV Thrombolysis: No, outside of the window EVT: No, outside of the window  NIHSS components Score: Comment  1a Level of Conscious 0[x]  1[]  2[]  3[]      1b LOC Questions 0[]  1[]  2[x]       1c LOC Commands 0[x]  1[]  2[]       2 Best Gaze 0[x]  1[]  2[]       3 Visual 0[x]  1[]  2[]  3[]      4 Facial Palsy 0[]  1[x]  2[]  3[]      5a Motor Arm - left 0[x]  1[]  2[]  3[]  4[]  UN[]    5b Motor Arm - Right 0[x]  1[]  2[]  3[]  4[]  UN[]    6a Motor Leg - Left 0[x]  1[]  2[]  3[]  4[]  UN[]    6b Motor Leg - Right 0[x]  1[]  2[]  3[]  4[]  UN[]    7 Limb Ataxia 0[x]  1[]  2[]  UN[]      8 Sensory 0[]  1[x]  2[]  UN[]      9 Best Language 0[]  1[]  2[x]  3[]      10 Dysarthria 0[]  1[x]  2[]  UN[]      11 Extinct. and Inattention 0[x]  1[]  2[]       TOTAL:7       ROS  Comprehensive ROS performed and pertinent positives documented in HPI   Past History   Past Medical History:  Diagnosis Date   Arthritis    bil hips   Heart murmur     Past Surgical History:  Procedure Laterality Date   LUMBAR LAMINECTOMY/DECOMPRESSION MICRODISCECTOMY Right 01/26/2015   Procedure: LUMBAR DECOMPRESSION L4-5 CENTRAL AND TO THE RIGHT;  Surgeon:  Tanda Heading, MD;  Location: [facility]S;  Service: Orthopedics;  Laterality: Right;    Family History: Family History  Problem Relation Age of Onset   Stroke Mother    Hypertension Mother    Hypertension Father     Social History  reports that he quit smoking about 7 years ago. His smoking use included cigarettes. He has never used smokeless tobacco. He reports current alcohol use of about 2.0 standard drinks of alcohol per week. He reports that he does not use drugs.  No Known Allergies  Medications  No current facility-administered medications for this encounter. No current outpatient medications on file.  Vitals   Vitals:   02/14/24 1012  BP: (!) 154/99  Pulse: 89  Resp: 18  Temp: 99 F (37.2 C)  TempSrc: Oral  SpO2: 99%    There is no height or weight on file to calculate BMI.   Physical Exam   Constitutional: Appears well-developed and well-nourished.  Psych: Affect appropriate to situation.  Eyes: No scleral injection.  HENT: No OP obstruction.  Head: Normocephalic.  Cardiovascular:  Normal rate and regular rhythm.  Respiratory: Effort normal, non-labored breathing.  GI: Soft.  No distension. There is no tenderness.  Skin: WDI.   Neurologic Examination    Neuro: Mental Status: Patient is awake, alert, states name, age as 63, repetition intact, perseverates throughout exam, unable to state month, states year correctly.  Some expressive and receptive aphasia, able to follow one step commands inconsistently.  Able to name some objects but unable to name some others.-Naming inconsistent Cranial Nerves: II: Visual Fields are full. Pupils are equal, round, and reactive to light.   III,IV, VI: EOMI without ptosis or diploplia.  V: Facial sensation is symmetric to temperature VII: Right facial asymmetry-lower facial weakness VIII: Hearing is intact to voice X: Palate elevates symmetrically XI: Shoulder shrug is symmetric. XII: Tongue protrudes midline  without atrophy or fasciculations.  Motor: Tone is normal. Bulk is normal. Full strength was present in bilateral upper and lower extremities with no drift Sensory: Sensation is symmetric to light touch and temperature in the arms and legs. No extinction to DSS present.  Cerebellar: FNF and HKS are intact bilaterally   Labs/Imaging/Neurodiagnostic studies   CBC:  Recent Labs  Lab 02/27/24 1024 02-27-24 1028  WBC 11.8*  --   NEUTROABS 8.0*  --   HGB 14.6 15.6  HCT 44.8 46.0  MCV 91.1  --   PLT 142*  --    Basic Metabolic Panel:  Lab Results  Component Value Date   NA 137 27-Feb-2024   K 3.8 2024/02/27   CO2 22 February 27, 2024   GLUCOSE 115 (H) 2024/02/27   BUN 15 27-Feb-2024   CREATININE 0.90 February 27, 2024   CALCIUM 9.1 02-27-24   GFRNONAA >60 02-27-24   GFRAA >60 10/27/2019   Alcohol Level     Component Value Date/Time   ETH <15 02-27-2024 1024   INR  Lab Results  Component Value Date   INR 1.3 (H) 2024-02-27   APTT  Lab Results  Component Value Date   APTT 29 2024/02/27   CT Head without contrast(Personally reviewed): Ovoid area of diminished attenuation in the left thalamus, concerning for subacute ischemic infarct. Focal area of diminished attenuation in the subcortical white matter of the left posterior temporal lobe, concerning for subcortical infarct.  Multiple areas of age indeterminate infarction within the cerebellar hemispheres bilaterally, worse on the left. Correlation with MRI of the brain and CT angiogram of the head and neck is suggested.  CT angio Head and Neck with contrast(Personally reviewed): No ELVO.  No acute findings  MRI Brain(Personally reviewed): Numerous foci of acute ischemia in both cerebral and cerebellar hemisphere with greater burden on the left-suspect underlying embolic source  ASSESSMENT   Riley Wright is a 63 y.o. male with a past medical history of back surgery. MRI concerning for embolic strokes.  Plan to admit to  hospitalist service for stroke workup.  At this time we will start him on DAPT with aspirin 81 mg and Plavix 75 mg.  Echocardiogram and venous duplex pending.  He may need a TEE depending on the results of his echo and DVT study.  Impression: Acute ischemic stroke-appearance on MRI looks embolic-etiology under investigation  RECOMMENDATIONS  - HgbA1c, fasting lipid panel - Frequent neuro checks -Bilateral lower extremity venous duplex - Echocardiogram  - May need TEE - not yet requested, will await TTE results first. - Prophylactic therapy-Antiplatelet med: Aspirin 81 mg and Plavix 75 mg for 3 weeks followed by aspirin only. - Risk factor modification -  Telemetry monitoring - PT consult, OT consult, Speech consult -No need for permissive hypertension as he is multiple days out of his Wright known well - Stroke team to follow   ______________________________________________________________________    Signed, Riley Last, NP [facility]   Attending Neurohospitalist Addendum Patient seen and examined with APP/Resident. Agree with the history and physical as documented above. Agree with the plan as documented, which I helped formulate. I have independently reviewed the chart, obtained history, review of systems and examined the patient.I have personally reviewed pertinent head/neck/spine imaging (CT/MRI). Please feel free to call with any questions.  -- Riley Lav, MD Neurologist [facility] Pager: [phone]   Plan relayed to IM TS resident MDs--Drs. Smucker and [employer]

## 2024-02-14 NOTE — ED Notes (Signed)
 Patient transported to MRI

## 2024-02-15 ENCOUNTER — Inpatient Hospital Stay (HOSPITAL_COMMUNITY)

## 2024-02-15 DIAGNOSIS — I63449 Cerebral infarction due to embolism of unspecified cerebellar artery: Secondary | ICD-10-CM

## 2024-02-15 DIAGNOSIS — D6869 Other thrombophilia: Secondary | ICD-10-CM | POA: Diagnosis not present

## 2024-02-15 DIAGNOSIS — R7401 Elevation of levels of liver transaminase levels: Secondary | ICD-10-CM | POA: Diagnosis not present

## 2024-02-15 DIAGNOSIS — I639 Cerebral infarction, unspecified: Secondary | ICD-10-CM

## 2024-02-15 DIAGNOSIS — C787 Secondary malignant neoplasm of liver and intrahepatic bile duct: Secondary | ICD-10-CM

## 2024-02-15 DIAGNOSIS — K8689 Other specified diseases of pancreas: Secondary | ICD-10-CM | POA: Diagnosis not present

## 2024-02-15 DIAGNOSIS — I2699 Other pulmonary embolism without acute cor pulmonale: Secondary | ICD-10-CM

## 2024-02-15 DIAGNOSIS — I38 Endocarditis, valve unspecified: Secondary | ICD-10-CM

## 2024-02-15 DIAGNOSIS — I69320 Aphasia following cerebral infarction: Secondary | ICD-10-CM | POA: Diagnosis not present

## 2024-02-15 DIAGNOSIS — I69391 Dysphagia following cerebral infarction: Secondary | ICD-10-CM

## 2024-02-15 DIAGNOSIS — R29705 NIHSS score 5: Secondary | ICD-10-CM

## 2024-02-15 DIAGNOSIS — I6389 Other cerebral infarction: Secondary | ICD-10-CM | POA: Diagnosis not present

## 2024-02-15 DIAGNOSIS — I6340 Cerebral infarction due to embolism of unspecified cerebral artery: Secondary | ICD-10-CM | POA: Diagnosis not present

## 2024-02-15 DIAGNOSIS — R160 Hepatomegaly, not elsewhere classified: Secondary | ICD-10-CM | POA: Diagnosis not present

## 2024-02-15 DIAGNOSIS — D72829 Elevated white blood cell count, unspecified: Secondary | ICD-10-CM

## 2024-02-15 DIAGNOSIS — E785 Hyperlipidemia, unspecified: Secondary | ICD-10-CM

## 2024-02-15 DIAGNOSIS — I2694 Multiple subsegmental pulmonary emboli without acute cor pulmonale: Secondary | ICD-10-CM

## 2024-02-15 DIAGNOSIS — R4182 Altered mental status, unspecified: Secondary | ICD-10-CM

## 2024-02-15 DIAGNOSIS — R748 Abnormal levels of other serum enzymes: Secondary | ICD-10-CM

## 2024-02-15 DIAGNOSIS — I824Z3 Acute embolism and thrombosis of unspecified deep veins of distal lower extremity, bilateral: Secondary | ICD-10-CM

## 2024-02-15 LAB — CBC
HCT: 40.2 % (ref 39.0–52.0)
Hemoglobin: 13.3 g/dL (ref 13.0–17.0)
MCH: 29.6 pg (ref 26.0–34.0)
MCHC: 33.1 g/dL (ref 30.0–36.0)
MCV: 89.5 fL (ref 80.0–100.0)
Platelets: 118 K/uL — ABNORMAL LOW (ref 150–400)
RBC: 4.49 MIL/uL (ref 4.22–5.81)
RDW: 12.9 % (ref 11.5–15.5)
WBC: 13.5 K/uL — ABNORMAL HIGH (ref 4.0–10.5)
nRBC: 0 % (ref 0.0–0.2)

## 2024-02-15 LAB — HEPATITIS PANEL, ACUTE
HCV Ab: NONREACTIVE
Hep A IgM: NONREACTIVE
Hep B C IgM: NONREACTIVE
Hepatitis B Surface Ag: NONREACTIVE

## 2024-02-15 LAB — COMPREHENSIVE METABOLIC PANEL WITH GFR
ALT: 57 U/L — ABNORMAL HIGH (ref 0–44)
AST: 66 U/L — ABNORMAL HIGH (ref 15–41)
Albumin: 3.1 g/dL — ABNORMAL LOW (ref 3.5–5.0)
Alkaline Phosphatase: 154 U/L — ABNORMAL HIGH (ref 38–126)
Anion gap: 13 (ref 5–15)
BUN: 11 mg/dL (ref 8–23)
CO2: 26 mmol/L (ref 22–32)
Calcium: 8.6 mg/dL — ABNORMAL LOW (ref 8.9–10.3)
Chloride: 97 mmol/L — ABNORMAL LOW (ref 98–111)
Creatinine, Ser: 1.01 mg/dL (ref 0.61–1.24)
GFR, Estimated: >60 mL/min (ref >60)
Glucose, Bld: 99 mg/dL (ref 70–99)
Potassium: 3.7 mmol/L (ref 3.5–5.1)
Sodium: 136 mmol/L (ref 135–145)
Total Bilirubin: 2.1 mg/dL — ABNORMAL HIGH (ref 0.0–1.2)
Total Protein: 6.3 g/dL — ABNORMAL LOW (ref 6.5–8.1)

## 2024-02-15 LAB — ECHOCARDIOGRAM COMPLETE BUBBLE STUDY
Area-P 1/2: 3.53 cm2
S' Lateral: 2.4 cm

## 2024-02-15 LAB — LIPID PANEL
Cholesterol: 154 mg/dL (ref 0–200)
HDL: 29 mg/dL — ABNORMAL LOW (ref >40)
LDL Cholesterol: 106 mg/dL — ABNORMAL HIGH (ref 0–99)
Total CHOL/HDL Ratio: 5.3 ratio
Triglycerides: 95 mg/dL (ref <150)
VLDL: 19 mg/dL (ref 0–40)

## 2024-02-15 LAB — HEPARIN LEVEL (UNFRACTIONATED): Heparin Unfractionated: 0.12 [IU]/mL — ABNORMAL LOW (ref 0.30–0.70)

## 2024-02-15 LAB — MAGNESIUM: Magnesium: 2 mg/dL (ref 1.7–2.4)

## 2024-02-15 LAB — PROTIME-INR
INR: 1.5 — ABNORMAL HIGH (ref 0.8–1.2)
Prothrombin Time: 18.6 s — ABNORMAL HIGH (ref 11.4–15.2)

## 2024-02-15 MED ORDER — HEPARIN (PORCINE) 25000 UT/250ML-% IV SOLN
1750.0000 [IU]/h | INTRAVENOUS | Status: DC
  Administered 2024-02-15: 1100 [IU]/h via INTRAVENOUS
  Administered 2024-02-16: 1500 [IU]/h via INTRAVENOUS
  Administered 2024-02-17: 1650 [IU]/h via INTRAVENOUS
  Administered 2024-02-17: 1700 [IU]/h via INTRAVENOUS
  Filled 2024-02-15 (×5): qty 250

## 2024-02-15 MED ORDER — ASPIRIN 81 MG PO CHEW
81.0000 mg | CHEWABLE_TABLET | Freq: Every day | ORAL | Status: DC
  Administered 2024-02-15: 81 mg via ORAL
  Filled 2024-02-15: qty 1

## 2024-02-15 MED ORDER — GADOBUTROL 1 MMOL/ML IV SOLN
9.4000 mL | Freq: Once | INTRAVENOUS | Status: AC | PRN
  Administered 2024-02-15: 9.4 mL via INTRAVENOUS

## 2024-02-15 NOTE — Consult Note (Addendum)
 Consultation  Referring Provider:  [facility]  Primary Care Physician:  Patient, No Pcp Per Primary Gastroenterologist:  [facility]       Reason for Consultation:     Pancreatic mass  LOS: 1 day          HPI:   Riley Wright is a 63 y.o. male with no significant past medical history significant presents for evaluation of pancreatic mass.  Patient initially presented to ED yesterday (10/31) with aphasia and altered mental status over the last 2 days.  Upon arrival to ED patient was found to have multiple acute embolic ischemic infarctions of the cerebral and cerebral hemispheres.  CTA showed minimal atherosclerotic calcification in left carotid bifurcation.  Patient then was found to have transaminitis so RUQ ultrasound was obtained which showed multiple solid-appearing hepatic masses (largest 4.2 cm in the right hepatic lobe and 3.6 cm in the left lobe) with concern for metastasis.  CT chest abdomen pelvis with contrast for staging of potential metastatic disease was obtained which revealed left-sided pulmonary emboli, 4.9 x 2.4 cm hypodense mass in the pancreatic body concern for pancreatic neoplasm.  Innumerable masses throughout the liver concerning for hepatic metastasis.  Pulmonary nodules concern for pulmonary mets.  And possible splenic infarcts  Labs notable for AST 66/ALT 57/alk phos 154/T. bili 2.1 Leukocytosis with WBC 13.5 Platelets 118 Negative hepatitis panel PT 18.6/INR 1.5  PREVIOUS GI WORKUP   No previous colonoscopy  Past Medical History:  Diagnosis Date   Arthritis    bil hips   Heart murmur     Surgical History:  He  has a past surgical history that includes Lumbar laminectomy/decompression microdiscectomy (Right, 01/26/2015). Family History:  His family history includes Hypertension in his father and mother; Stroke in his mother. Social History:   reports that he quit smoking about 7 years ago. His smoking use included cigarettes. He has  never used smokeless tobacco. He reports current alcohol use of about 2.0 standard drinks of alcohol per week. He reports that he does not use drugs.  Prior to Admission medications   Not on File    Current Facility-Administered Medications  Medication Dose Route Frequency Provider Last Rate Last Admin    stroke: early stages of recovery book   Does not apply Once Remi Pippin, NP       aspirin chewable tablet 81 mg  81 mg Oral Daily Waddell Aquas A, NP       enoxaparin (LOVENOX) injection 40 mg  40 mg Subcutaneous Q24H Bender, Emily, DO        Allergies as of 02/14/2024   (No Known Allergies)    Review of Systems  Unable to perform ROS: Mental status change       Physical Exam:  Vital signs in last 24 hours: Temp:  [97.7 F (36.5 C)-99.9 F (37.7 C)] 98.7 F (37.1 C) (11/01 0839) Pulse Rate:  [66-89] 73 (11/01 0839) Resp:  [16-30] 22 (11/01 0839) BP: (127-165)/(82-106) 138/88 (11/01 0839) SpO2:  [97 %-100 %] 100 % (11/01 0839) Weight:  [94.2 kg] 94.2 kg (10/31 1746) Last BM Date : 02/14/24 Last BM recorded by nurses in past 5 days No data recorded  Physical Exam Constitutional:      Appearance: Normal appearance. He is normal weight.  HENT:     Head: Normocephalic and atraumatic.     Nose: Nose normal. No congestion.     Mouth/Throat:     Mouth: Mucous membranes  are moist.     Pharynx: Oropharynx is clear.  Eyes:     General: No scleral icterus.    Extraocular Movements: Extraocular movements intact.  Cardiovascular:     Rate and Rhythm: Normal rate and regular rhythm.  Pulmonary:     Effort: Pulmonary effort is normal.     Breath sounds: Normal breath sounds.  Abdominal:     General: There is no distension.     Palpations: Abdomen is soft.  Musculoskeletal:        General: No swelling. Normal range of motion.  Skin:    General: Skin is warm and dry.  Neurological:     General: No focal deficit present.     Mental Status: He is oriented to person,  place, and time.  Psychiatric:        Mood and Affect: Mood normal.        Behavior: Behavior normal.        Thought Content: Thought content normal.        Judgment: Judgment normal.      LAB RESULTS: Recent Labs    02/14/24 1024 02/14/24 1028 02/15/24 0507  WBC 11.8*  --  13.5*  HGB 14.6 15.6 13.3  HCT 44.8 46.0 40.2  PLT 142*  --  118*   BMET Recent Labs    02/14/24 1024 02/14/24 1028 02/15/24 0507  NA 137 137 136  K 3.8 3.8 3.7  CL 98 100 97*  CO2 22  --  26  GLUCOSE 117* 115* 99  BUN 13 15 11   CREATININE 1.11 0.90 1.01  CALCIUM 9.1  --  8.6*   LFT Recent Labs    02/15/24 0507  PROT 6.3*  ALBUMIN 3.1*  AST 66*  ALT 57*  ALKPHOS 154*  BILITOT 2.1*   PT/INR Recent Labs    02/14/24 1024 02/15/24 0507  LABPROT 16.5* 18.6*  INR 1.3* 1.5*    STUDIES: CT CHEST ABDOMEN PELVIS W CONTRAST Result Date: 02/14/2024 CLINICAL DATA:  Concern for metastatic disease, multiple hepatic masses on ultrasound EXAM: CT CHEST, ABDOMEN, AND PELVIS WITH CONTRAST TECHNIQUE: Multidetector CT imaging of the chest, abdomen and pelvis was performed following the standard protocol during bolus administration of intravenous contrast. RADIATION DOSE REDUCTION: This exam was performed according to the departmental dose-optimization program which includes automated exposure control, adjustment of the mA and/or kV according to patient size and/or use of iterative reconstruction technique. CONTRAST:  75mL OMNIPAQUE  IOHEXOL  350 MG/ML SOLN COMPARISON:  02/14/2024, 10/27/2019 FINDINGS: CT CHEST FINDINGS Cardiovascular: The heart is unremarkable without pericardial effusion. No evidence of thoracic aortic aneurysm or dissection. While not optimized for opacification of the pulmonary vasculature, filling defects are seen within the left upper and left lower lobe pulmonary arteries compatible with pulmonary emboli. Mediastinum/Nodes: No enlarged mediastinal, hilar, or axillary lymph nodes. Thyroid  gland, trachea, and esophagus demonstrate no significant findings. Lungs/Pleura: Trace bilateral pleural effusions. There are innumerable bilateral subcentimeter pulmonary nodules concerning for metastatic disease given findings on previous ultrasound. Index nodule in the left upper lobe reference image 59/5 measures 5 mm. No pneumothorax. The central airways are patent. Musculoskeletal: There are no acute or destructive bony abnormalities. Prior healed left rib fractures. Reconstructed images demonstrate no additional findings. CT ABDOMEN PELVIS FINDINGS Hepatobiliary: Innumerable hypodense masses are seen throughout the liver compatible with metastatic disease. Index lesion in the left lobe measures 4.6 x 4.8 cm reference image 42/3. Gallbladder is decompressed without evidence of cholelithiasis or cholecystitis. No biliary duct dilation.  Pancreas: Ill-defined hypodense mass within the pancreatic body measuring 4.9 x 2.4 cm, highly concerning for pancreatic neoplasm. No acute inflammatory changes. Spleen: Wedge-shaped peripheral hypodensities are seen within the spleen, the largest of which persists on delayed imaging, which could reflect splenic infarcts. No evidence of splenomegaly. Adrenals/Urinary Tract: Multiple simple appearing bilateral renal cysts, largest in the upper pole right kidney measuring 8.1 cm. No specific imaging follow-up is recommended. No obstructive uropathy within either kidney. Excreted contrast from previous CT within the bladder lumen, with no evidence of filling defect. The adrenals are unremarkable. Stomach/Bowel: No bowel obstruction or ileus. Normal appendix right lower quadrant. No bowel wall thickening or inflammatory change. Vascular/Lymphatic: No discrete adenopathy within the abdomen or pelvis. No significant vascular findings. Reproductive: Prostate is enlarged, with hypertrophied median lobe resulting in mass effect upon the inferior wall the bladder. Other: No free fluid or  free intraperitoneal gas. No abdominal wall hernia. Musculoskeletal: No acute or destructive bony abnormalities. Reconstructed images demonstrate no additional findings. IMPRESSION: 1. Incidental left-sided pulmonary emboli. No evidence of right heart strain. 2. 4.9 x 2.4 cm hypodense mass within the pancreatic body, highly concerning for pancreatic neoplasm. 3. Innumerable hypodense masses throughout the liver consistent with hepatic metastases. 4. Innumerable subcentimeter bilateral pulmonary nodules consistent with pulmonary metastases. Largest index nodule in the left upper lobe measures 5 mm. 5. Peripheral wedge-shaped hypodensities within the spleen, which could reflect splenic infarcts. 6. Trace bilateral pleural effusions. 7. Enlarged prostate. Critical Value/emergent results were called by telephone at the time of interpretation on 02/14/2024 at 8:10 pm to provider [facility] , who verbally acknowledged these results. Electronically Signed   By: Ozell Daring M.D.   On: 02/14/2024 20:16   US  Abdomen Limited RUQ (LIVER/GB) Result Date: 02/14/2024 EXAM: Right Upper Quadrant Abdominal Ultrasound 02/14/2024 05:10:53 PM TECHNIQUE: Real-time ultrasonography of the right upper quadrant of the abdomen was performed. COMPARISON: None available. CLINICAL HISTORY: Elevated AST (SGOT) [id number]; [id number] Elevated ALT measurement [id number]. FINDINGS: LIVER: Multiple solid-appearing hepatic masses, the largest in the right hepatic lobe measuring up to 4.2 cm and the largest in the left hepatic lobe measuring 3.6 cm. Findings concerning for metastases. No intrahepatic biliary ductal dilatation. BILIARY SYSTEM: Gallbladder not definitively visualized. Common bile duct is within normal limits measuring . RIGHT KIDNEY: The right kidney is grossly unremarkable in appearances without evidence of hydronephrosis, echogenic calculi or worrisome mass lesions. PANCREAS: Visualized portions of the pancreas are unremarkable. OTHER: No  right upper quadrant ascites. IMPRESSION: 1. Multiple solid-appearing hepatic masses, largest 4.2 cm in the right lobe and 3.6 cm in the left lobe, concerning for metastases. . 2. Gallbladder not definitively visualized. Electronically signed by: Franky Crease MD 02/14/2024 05:27 PM EDT RP Workstation: [id number]   CT ANGIO HEAD NECK W WO CM Result Date: 02/14/2024 EXAM: CTA HEAD AND NECK WITH CONTRAST 02/14/2024 01:34:00 PM TECHNIQUE: CTA of the head and neck was performed with the administration of 75 mL of iohexol  (OMNIPAQUE ) 350 MG/ML injection. Multiplanar 2D and/or 3D reformatted images are provided for review. Automated exposure control, iterative reconstruction, and/or weight based adjustment of the mA/kV was utilized to reduce the radiation dose to as low as reasonably achievable. Stenosis of the internal carotid arteries measured using NASCET criteria. COMPARISON: brain MRI 02/14/2024. CLINICAL HISTORY: Stroke/TIA, determine embolic source. FINDINGS: CTA NECK: AORTIC ARCH AND ARCH VESSELS: No dissection or arterial injury. No significant stenosis of the brachiocephalic or subclavian arteries. CERVICAL CAROTID ARTERIES: Minimal atherosclerotic calcification at the left carotid bifurcation.  No hemodynamically significant stenosis of either carotid system by NASCET criteria. No dissection or arterial injury. CERVICAL VERTEBRAL ARTERIES: Vertebral arteries are right dominant. No dissection, arterial injury, or significant stenosis. LUNGS AND MEDIASTINUM: Unremarkable. SOFT TISSUES: No acute abnormality. BONES: No acute abnormality. CTA HEAD: ANTERIOR CIRCULATION: No significant stenosis of the internal carotid arteries. No significant stenosis of the anterior cerebral arteries. No significant stenosis of the middle cerebral arteries. No aneurysm. POSTERIOR CIRCULATION: No significant stenosis of the posterior cerebral arteries. No significant stenosis of the basilar artery. No significant stenosis of the  vertebral arteries. No aneurysm. OTHER: No dural venous sinus thrombosis on this non-dedicated study. IMPRESSION: 1. No large vessel occlusion, hemodynamically significant stenosis, or aneurysm in the head or neck. 2. Minimal atherosclerotic calcification at the left carotid bifurcation without hemodynamically significant stenosis. 3. Essentially no atherosclerotic disease of the visualized craniocervical arteries consider cardiac source/arrhythmia as a potential source of embolism infarcts. Correlation with echocardiogram suggested. Electronically signed by: Franky Stanford MD 02/14/2024 02:42 PM EDT RP Workstation: [id number]   MR BRAIN WO CONTRAST Result Date: 02/14/2024 EXAM: MRI BRAIN WITHOUT CONTRAST 02/14/2024 01:03:36 PM TECHNIQUE: Multiplanar multisequence MRI of the head/brain was performed without the administration of intravenous contrast. COMPARISON: None available. CLINICAL HISTORY: FINDINGS: BRAIN AND VENTRICLES: There are numerous foci of acute ischemia throughout both cerebral hemispheres and cerebellar hemispheres. The largest lesion is in the left thalamus. The burden of lesions is greater on the left than the right. Scattered chronic microhemorrhages in the peripheral cerebral hemispheres. Multifocal hyperintense T2-weighted signal within the cerebral white matter, most commonly due to chronic small vessel disease. No mass. No midline shift. No hydrocephalus. The sella is unremarkable. Normal flow voids. ORBITS: No acute abnormality. SINUSES AND MASTOIDS: No acute abnormality. BONES AND SOFT TISSUES: Normal marrow signal. No acute soft tissue abnormality. IMPRESSION: 1. Numerous acute ischemic foci throughout both cerebral and cerebellar hemispheres, with greater burden on the left. This is consistent with an embolic process from a cardiac or proximal aortic source. 2. Multifocal cerebral white matter T2 hyperintensities consistent with chronic small vessel disease. Electronically signed by:  Franky Stanford MD 02/14/2024 02:38 PM EDT RP Workstation: [id number]   CT HEAD WO CONTRAST Result Date: 02/14/2024 EXAM: CT HEAD WITHOUT CONTRAST 02/14/2024 11:16:00 AM TECHNIQUE: CT of the head was performed without the administration of intravenous contrast. Automated exposure control, iterative reconstruction, and/or weight based adjustment of the mA/kV was utilized to reduce the radiation dose to as low as reasonably achievable. COMPARISON: None available. CLINICAL HISTORY: Memory loss. FINDINGS: BRAIN AND VENTRICLES: No acute hemorrhage. There is an ovoid area of diminished attenuation present anteriorly within the left thalamus concerning for subacute ischemic infarct. There is also a focal area of diminished attenuation in the subcortical white matter of the left posterior temporal lobe seen on image 16 of series 3, which is concerning for subcortical infarct. There are also multiple areas of age indeterminate infarction within the cerebellar hemispheres bilaterally, worse on the left. There is no significant cerebral or cerebellar swelling. There is mild cerebral white matter disease. No hydrocephalus. No extra-axial collection. No mass effect or midline shift. ORBITS: No acute abnormality. SINUSES: No acute abnormality. SOFT TISSUES AND SKULL: No acute soft tissue abnormality. No skull fracture. IMPRESSION: 1. Ovoid area of diminished attenuation in the left thalamus, concerning for subacute ischemic infarct. 2. Focal area of diminished attenuation in the subcortical white matter of the left posterior temporal lobe, concerning for subcortical infarct. 3. Multiple areas of age indeterminate  infarction within the cerebellar hemispheres bilaterally, worse on the left. 4. Correlation with MRI of the brain and CT angiogram of the head and neck is suggested. Electronically signed by: Evalene Coho MD 02/14/2024 11:29 AM EDT RP Workstation: [id number]      Impression    Transaminitis Pancreatic mass  with possible hepatic and lung mets CT chest abdomen pelvis with contrast for staging of potential metastatic disease was obtained which revealed left-sided pulmonary emboli, 4.9 x 2.4 cm hypodense mass in the pancreatic body concern for pancreatic neoplasm.  Innumerable masses throughout the liver concerning for hepatic metastasis.  Pulmonary nodules concern for pulmonary mets.  And possible splenic infarcts AST 66/ALT 57/alk phos 154/T. bili 2.1 Leukocytosis with WBC 13.5 Platelets 118 Negative hepatitis panel PT 18.6/INR 1.5  Acute ischemic stroke MRI concerning for embolic strokes Plan to start on DAPT with aspirin and Plavix but holding due to above Neurology following   Plan   - Consult oncology, appreciate their recommendations. They recommend hepatic bx to determine etiology of mets. If inconclusive, we can arrange outpatient follow up for possible EUS - Continue to trend LFTs - CEA, CA 19-9, AFP  Thank you for your kind consultation, we will continue to follow.   Bayley CHRISTELLA Blower  02/15/2024, 9:19 AM   --------------------------------------------------------------------------------------------------------------------------------  I have taken a history, reviewed the chart and examined the patient. I performed a substantive portion of this encounter, including complete performance of at least one of the key components, in conjunction with the APP. I agree with the APP's note, impression and recommendations.  Mr. Riley Wright is a very unfortunate 63 year old male who had no significant past medical history, until he presented to the ED yesterday with altered mental status and aphasia.  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 GI was consulted to discuss possible endoscopic ultrasound to sample the pancreas. IR has also been consulted, and is planning  to perform biopsy of liver lesion on November 7. We currently do not have any inpatient endoscopic ultrasound capabilities.  Dr. Wilhelmenia is currently out of town and will not return until November 5.  He is also not covering inpatient next week, so it would be unclear if and when we would be able to offer an endoscopic ultrasound. I think pursuing the percutaneous biopsy of the liver lesion would be the most expedient way to obtain tissue diagnosis.  The patient has mild liver enzyme elevation and hyperbilirubinemia, secondary to hepatic mets.  However, there is no evidence of bile duct obstruction, and no role for biliary decompression/stenting.  No additional recommendations from GI at this time.  Please reconsult if percutaneous biopsy is unsuccessful/nondiagnostic and endoscopic ultrasound is again requested.  Zollie Ellery E. Stacia, MD [facility] G[facility]enter[facility]

## 2024-02-15 NOTE — Progress Notes (Addendum)
 STROKE TEAM PROGRESS NOTE   INTERIM HISTORY/SUBJECTIVE Family at the bedside. Patient presented yesterday with confusion, altered behavior and aphasia CT head was done and revealed multiple subacute//acute infarct.  MRI brain revealed numerous foci of acute ischemia in both cerebellar and cerebral hemispheres.   CT chest abdomen pelvis with left sided pulmonary emboli with splenic infects and concerns of liver mets and pancreatic neoplasm.   CBC    Component Value Date/Time   WBC 13.5 (H) 02/15/2024 0507   RBC 4.49 02/15/2024 0507   HGB 13.3 02/15/2024 0507   HCT 40.2 02/15/2024 0507   PLT 118 (L) 02/15/2024 0507   MCV 89.5 02/15/2024 0507   MCH 29.6 02/15/2024 0507   MCHC 33.1 02/15/2024 0507   RDW 12.9 02/15/2024 0507   LYMPHSABS 2.5 02/14/2024 1024   MONOABS 1.0 02/14/2024 1024   EOSABS 0.1 02/14/2024 1024   BASOSABS 0.1 02/14/2024 1024    BMET    Component Value Date/Time   NA 136 02/15/2024 0507   K 3.7 02/15/2024 0507   CL 97 (L) 02/15/2024 0507   CO2 26 02/15/2024 0507   GLUCOSE 99 02/15/2024 0507   BUN 11 02/15/2024 0507   CREATININE 1.01 02/15/2024 0507   CALCIUM 8.6 (L) 02/15/2024 0507   GFRNONAA >60 02/15/2024 0507    IMAGING past 24 hours CT CHEST ABDOMEN PELVIS W CONTRAST Result Date: 02/14/2024 CLINICAL DATA:  Concern for metastatic disease, multiple hepatic masses on ultrasound EXAM: CT CHEST, ABDOMEN, AND PELVIS WITH CONTRAST TECHNIQUE: Multidetector CT imaging of the chest, abdomen and pelvis was performed following the standard protocol during bolus administration of intravenous contrast. RADIATION DOSE REDUCTION: This exam was performed according to the departmental dose-optimization program which includes automated exposure control, adjustment of the mA and/or kV according to patient size and/or use of iterative reconstruction technique. CONTRAST:  75mL OMNIPAQUE  IOHEXOL  350 MG/ML SOLN COMPARISON:  02/14/2024, 10/27/2019 FINDINGS: CT CHEST FINDINGS  Cardiovascular: The heart is unremarkable without pericardial effusion. No evidence of thoracic aortic aneurysm or dissection. While not optimized for opacification of the pulmonary vasculature, filling defects are seen within the left upper and left lower lobe pulmonary arteries compatible with pulmonary emboli. Mediastinum/Nodes: No enlarged mediastinal, hilar, or axillary lymph nodes. Thyroid gland, trachea, and esophagus demonstrate no significant findings. Lungs/Pleura: Trace bilateral pleural effusions. There are innumerable bilateral subcentimeter pulmonary nodules concerning for metastatic disease given findings on previous ultrasound. Index nodule in the left upper lobe reference image 59/5 measures 5 mm. No pneumothorax. The central airways are patent. Musculoskeletal: There are no acute or destructive bony abnormalities. Prior healed left rib fractures. Reconstructed images demonstrate no additional findings. CT ABDOMEN PELVIS FINDINGS Hepatobiliary: Innumerable hypodense masses are seen throughout the liver compatible with metastatic disease. Index lesion in the left lobe measures 4.6 x 4.8 cm reference image 42/3. Gallbladder is decompressed without evidence of cholelithiasis or cholecystitis. No biliary duct dilation. Pancreas: Ill-defined hypodense mass within the pancreatic body measuring 4.9 x 2.4 cm, highly concerning for pancreatic neoplasm. No acute inflammatory changes. Spleen: Wedge-shaped peripheral hypodensities are seen within the spleen, the largest of which persists on delayed imaging, which could reflect splenic infarcts. No evidence of splenomegaly. Adrenals/Urinary Tract: Multiple simple appearing bilateral renal cysts, largest in the upper pole right kidney measuring 8.1 cm. No specific imaging follow-up is recommended. No obstructive uropathy within either kidney. Excreted contrast from previous CT within the bladder lumen, with no evidence of filling defect. The adrenals are  unremarkable. Stomach/Bowel: No bowel obstruction or  ileus. Normal appendix right lower quadrant. No bowel wall thickening or inflammatory change. Vascular/Lymphatic: No discrete adenopathy within the abdomen or pelvis. No significant vascular findings. Reproductive: Prostate is enlarged, with hypertrophied median lobe resulting in mass effect upon the inferior wall the bladder. Other: No free fluid or free intraperitoneal gas. No abdominal wall hernia. Musculoskeletal: No acute or destructive bony abnormalities. Reconstructed images demonstrate no additional findings. IMPRESSION: 1. Incidental left-sided pulmonary emboli. No evidence of right heart strain. 2. 4.9 x 2.4 cm hypodense mass within the pancreatic body, highly concerning for pancreatic neoplasm. 3. Innumerable hypodense masses throughout the liver consistent with hepatic metastases. 4. Innumerable subcentimeter bilateral pulmonary nodules consistent with pulmonary metastases. Largest index nodule in the left upper lobe measures 5 mm. 5. Peripheral wedge-shaped hypodensities within the spleen, which could reflect splenic infarcts. 6. Trace bilateral pleural effusions. 7. Enlarged prostate. Critical Value/emergent results were called by telephone at the time of interpretation on 02/14/2024 at 8:10 pm to provider [facility] , who verbally acknowledged these results. Electronically Signed   By: Ozell Daring M.D.   On: 02/14/2024 20:16   US  Abdomen Limited RUQ (LIVER/GB) Result Date: 02/14/2024 EXAM: Right Upper Quadrant Abdominal Ultrasound 02/14/2024 05:10:53 PM TECHNIQUE: Real-time ultrasonography of the right upper quadrant of the abdomen was performed. COMPARISON: None available. CLINICAL HISTORY: Elevated AST (SGOT) [id number]; [id number] Elevated ALT measurement [id number]. FINDINGS: LIVER: Multiple solid-appearing hepatic masses, the largest in the right hepatic lobe measuring up to 4.2 cm and the largest in the left hepatic lobe measuring 3.6 cm.  Findings concerning for metastases. No intrahepatic biliary ductal dilatation. BILIARY SYSTEM: Gallbladder not definitively visualized. Common bile duct is within normal limits measuring . RIGHT KIDNEY: The right kidney is grossly unremarkable in appearances without evidence of hydronephrosis, echogenic calculi or worrisome mass lesions. PANCREAS: Visualized portions of the pancreas are unremarkable. OTHER: No right upper quadrant ascites. IMPRESSION: 1. Multiple solid-appearing hepatic masses, largest 4.2 cm in the right lobe and 3.6 cm in the left lobe, concerning for metastases. . 2. Gallbladder not definitively visualized. Electronically signed by: Franky Crease MD 02/14/2024 05:27 PM EDT RP Workstation: [id number]   CT ANGIO HEAD NECK W WO CM Result Date: 02/14/2024 EXAM: CTA HEAD AND NECK WITH CONTRAST 02/14/2024 01:34:00 PM TECHNIQUE: CTA of the head and neck was performed with the administration of 75 mL of iohexol  (OMNIPAQUE ) 350 MG/ML injection. Multiplanar 2D and/or 3D reformatted images are provided for review. Automated exposure control, iterative reconstruction, and/or weight based adjustment of the mA/kV was utilized to reduce the radiation dose to as low as reasonably achievable. Stenosis of the internal carotid arteries measured using NASCET criteria. COMPARISON: brain MRI 02/14/2024. CLINICAL HISTORY: Stroke/TIA, determine embolic source. FINDINGS: CTA NECK: AORTIC ARCH AND ARCH VESSELS: No dissection or arterial injury. No significant stenosis of the brachiocephalic or subclavian arteries. CERVICAL CAROTID ARTERIES: Minimal atherosclerotic calcification at the left carotid bifurcation. No hemodynamically significant stenosis of either carotid system by NASCET criteria. No dissection or arterial injury. CERVICAL VERTEBRAL ARTERIES: Vertebral arteries are right dominant. No dissection, arterial injury, or significant stenosis. LUNGS AND MEDIASTINUM: Unremarkable. SOFT TISSUES: No acute  abnormality. BONES: No acute abnormality. CTA HEAD: ANTERIOR CIRCULATION: No significant stenosis of the internal carotid arteries. No significant stenosis of the anterior cerebral arteries. No significant stenosis of the middle cerebral arteries. No aneurysm. POSTERIOR CIRCULATION: No significant stenosis of the posterior cerebral arteries. No significant stenosis of the basilar artery. No significant stenosis of the vertebral arteries. No aneurysm. OTHER: No  dural venous sinus thrombosis on this non-dedicated study. IMPRESSION: 1. No large vessel occlusion, hemodynamically significant stenosis, or aneurysm in the head or neck. 2. Minimal atherosclerotic calcification at the left carotid bifurcation without hemodynamically significant stenosis. 3. Essentially no atherosclerotic disease of the visualized craniocervical arteries consider cardiac source/arrhythmia as a potential source of embolism infarcts. Correlation with echocardiogram suggested. Electronically signed by: Franky Stanford MD 02/14/2024 02:42 PM EDT RP Workstation: [id number]   MR BRAIN WO CONTRAST Result Date: 02/14/2024 EXAM: MRI BRAIN WITHOUT CONTRAST 02/14/2024 01:03:36 PM TECHNIQUE: Multiplanar multisequence MRI of the head/brain was performed without the administration of intravenous contrast. COMPARISON: None available. CLINICAL HISTORY: FINDINGS: BRAIN AND VENTRICLES: There are numerous foci of acute ischemia throughout both cerebral hemispheres and cerebellar hemispheres. The largest lesion is in the left thalamus. The burden of lesions is greater on the left than the right. Scattered chronic microhemorrhages in the peripheral cerebral hemispheres. Multifocal hyperintense T2-weighted signal within the cerebral white matter, most commonly due to chronic small vessel disease. No mass. No midline shift. No hydrocephalus. The sella is unremarkable. Normal flow voids. ORBITS: No acute abnormality. SINUSES AND MASTOIDS: No acute abnormality.  BONES AND SOFT TISSUES: Normal marrow signal. No acute soft tissue abnormality. IMPRESSION: 1. Numerous acute ischemic foci throughout both cerebral and cerebellar hemispheres, with greater burden on the left. This is consistent with an embolic process from a cardiac or proximal aortic source. 2. Multifocal cerebral white matter T2 hyperintensities consistent with chronic small vessel disease. Electronically signed by: Franky Stanford MD 02/14/2024 02:38 PM EDT RP Workstation: [id number]   CT HEAD WO CONTRAST Result Date: 02/14/2024 EXAM: CT HEAD WITHOUT CONTRAST 02/14/2024 11:16:00 AM TECHNIQUE: CT of the head was performed without the administration of intravenous contrast. Automated exposure control, iterative reconstruction, and/or weight based adjustment of the mA/kV was utilized to reduce the radiation dose to as low as reasonably achievable. COMPARISON: None available. CLINICAL HISTORY: Memory loss. FINDINGS: BRAIN AND VENTRICLES: No acute hemorrhage. There is an ovoid area of diminished attenuation present anteriorly within the left thalamus concerning for subacute ischemic infarct. There is also a focal area of diminished attenuation in the subcortical white matter of the left posterior temporal lobe seen on image 16 of series 3, which is concerning for subcortical infarct. There are also multiple areas of age indeterminate infarction within the cerebellar hemispheres bilaterally, worse on the left. There is no significant cerebral or cerebellar swelling. There is mild cerebral white matter disease. No hydrocephalus. No extra-axial collection. No mass effect or midline shift. ORBITS: No acute abnormality. SINUSES: No acute abnormality. SOFT TISSUES AND SKULL: No acute soft tissue abnormality. No skull fracture. IMPRESSION: 1. Ovoid area of diminished attenuation in the left thalamus, concerning for subacute ischemic infarct. 2. Focal area of diminished attenuation in the subcortical white matter of the  left posterior temporal lobe, concerning for subcortical infarct. 3. Multiple areas of age indeterminate infarction within the cerebellar hemispheres bilaterally, worse on the left. 4. Correlation with MRI of the brain and CT angiogram of the head and neck is suggested. Electronically signed by: Evalene Coho MD 02/14/2024 11:29 AM EDT RP Workstation: [id number]    Vitals:   02/14/24 1749 02/14/24 2007 02/14/24 2356 02/15/24 0502  BP: (!) 151/93 (!) 165/91 (!) 142/82 (!) 147/87  Pulse: 75 72 74 82  Resp: 18 18 17 17   Temp: 97.7 F (36.5 C) 98.4 F (36.9 C) 99.5 F (37.5 C) 99.9 F (37.7 C)  TempSrc: Oral Oral Oral Oral  SpO2:  99% 100% 100% 97%  Weight:      Height:         PHYSICAL EXAM General:  Alert, well-nourished, well-developed patient in no acute distress Psych: Flat CV: Regular rate and rhythm on monitor Respiratory:  Regular, unlabored respirations on room air GI: Abdomen soft and nontender   NEURO:  Mental Status: Confused, aphasic, can follow simple 1 and occasional two-step commands.  Disoriented to time place and person.  Easy distractibility.  Poor attention span.  Poor insight into his condition.  Affect inappropriate for situation  Cranial Nerves:  II: PERRL. Visual fields full.  III, IV, VI: EOMI. Eyelids elevate symmetrically.  V: Sensation is intact to light touch and symmetrical to face.  VII: Right facial droop VIII: hearing intact to voice. IX, X: Palate elevates symmetrically. Phonation is normal.  KP:Dynloizm shrug 5/5. XII: tongue is midline without fasciculations. Motor: 5/5 strength in left arm and leg, right hand and arm weak with drift right leg with drift, decreased fine motor on right Tone: is normal and bulk is normal Sensation- Intact to light touch bilaterally. Extinction absent to light touch to DSS.   Coordination: FTN intact bilaterally, HKS: no ataxia in BLE.No drift.  Decreased fine motor skills on right, left over right  orbiting Gait- deferred  Most Recent NIH 5   ASSESSMENT/PLAN  Mr. Riley Wright is a 63 y.o. male with history of  back surgery. MRI concerning for embolic strokes.  Plan to admit to hospitalist service for stroke workup.  NIH on Admission 7  Acute Ischemic Infarct:  bilateral multiple  Etiology: Likely embolic due to hypercoagulable state due to new diagnosed cancer versus marantic endocarditis.  Doubt multiple brain metastasis CT head Ovoid area of diminished attenuation in the left thalamus, concerning for subacute ischemic infarct. 2. Focal area of diminished attenuation in the subcortical white matter of the left posterior temporal lobe, concerning for subcortical infarct. 3. Multiple areas of age indeterminate infarction within the cerebellar hemispheres bilaterally, worse on the left. CTA head & neck No LVO  MRI    Numerous acute ischemic foci throughout both cerebral and cerebellar hemispheres, with greater burden on the left. MRI brain w contrast ordered  CT abd/ chest/pelvis 1. Incidental left-sided pulmonary emboli. No evidence of right heart strain.2. 4.9 x 2.4 cm hypodense mass within the pancreatic body, highly concerning for pancreatic neoplasm. 3. Innumerable hypodense masses throughout the liver consistent with hepatic metastases. 4. Innumerable subcentimeter bilateral pulmonary nodules consistent with pulmonary metastases. Largest index nodule in the left upper lobe measures 5 mm. 5. Peripheral wedge-shaped hypodensities within the spleen, which could reflect splenic infarcts. 6. Trace bilateral pleural effusions. 7. Enlarged prostate. LE US  ordered 2D Echo ordered.  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 Agree with note above.  Patient presented with confusion and altered behaviors secondary to multiple bilateral embolic like infarcts possibly related to hypercoagulability from pancreatic cancer with liver metastasis.  He also has small pulmonary emboli.  Other possibilities include marantic endocarditis or multiple brain metastasis.  Recommend check MRI of the brain with contrast to look for metastasis.  Patient will need anticoagulation and recommend IV heparin drip for now since patient is likely going to need a biopsy to confirm his cancer.  May switch to warfarin or full dose Lovenox after the biopsy.  Patient's prognosis appears poor.  Recommend palliative care consult to discuss goals of care.  Long discussion with patient and son at the bedside and answered questions.  Discussed with Dr. Napoleon and Dr. Cloretta oncologist   I personally spent a total of 50 minutes in the care of the patient today including getting/reviewing separately obtained history, performing a medically appropriate exam/evaluation, counseling and educating, placing orders, referring and communicating with other health care professionals, documenting clinical information in the EHR,  independently interpreting results, and coordinating care.        Eather Popp, MD Medical Director [facility] Pager: [phone] 02/15/2024 2:53 PM   To contact Stroke Continuity provider, please refer to [email or web address]. After hours, contact General Neurology

## 2024-02-15 NOTE — Plan of Care (Signed)
  Problem: Health Behavior/Discharge Planning: Goal: Goals will be collaboratively established with patient/family 02/15/2024 0945 by Berkeley Verdie BRAVO, RN Outcome: Progressing 02/15/2024 0914 by Berkeley Verdie BRAVO, RN Outcome: Progressing

## 2024-02-15 NOTE — Progress Notes (Signed)
 PT Cancellation Note  Patient Details Name: Riley Wright MRN: [id number] DOB: [birth year]   Cancelled Treatment:    Reason Eval/Treat Not Completed: Medical issues which prohibited therapy (Patient with acute PE complicated by concern for small petechial hemorrhage. Patient with CT pending. Acute PT to hold and re-attempt when medically appropriate.)  Sharlyne Koeneman W, PT, DPT Secure Chat Preferred  Rehab Office [phone]   Kate BRAVO [name] 02/15/2024, 9:39 AM

## 2024-02-15 NOTE — Progress Notes (Signed)
 VASCULAR LAB    Bilateral lower extremity venous duplex has been performed.  See CV proc for preliminary results.   Samreen Seltzer, RVT 02/15/2024, 2:48 PM

## 2024-02-15 NOTE — Progress Notes (Signed)
 PHARMACY - ANTICOAGULATION CONSULT NOTE  Pharmacy Consult for heparin  Indication: PE, splenic infarct  No Known Allergies  Patient Measurements: Height: 6' 2 (188 cm) Weight: 94.2 kg (207 lb 9.6 oz) IBW/kg (Calculated) : 82.2 HEPARIN DW (KG): 94.2  Vital Signs: Temp: 98.8 F (37.1 C) (11/01 1954) Temp Source: Oral (11/01 1954) BP: 123/84 (11/01 1954) Pulse Rate: 74 (11/01 1954)  Labs: Recent Labs    02/14/24 1024 02/14/24 1028 02/15/24 0507 02/15/24 2044  HGB 14.6 15.6 13.3  --   HCT 44.8 46.0 40.2  --   PLT 142*  --  118*  --   APTT 29  --   --   --   LABPROT 16.5*  --  18.6*  --   INR 1.3*  --  1.5*  --   HEPARINUNFRC  --   --   --  0.12*  CREATININE 1.11 0.90 1.01  --     Estimated Creatinine Clearance: 88.2 mL/min (by C-G formula based on SCr of 1.01 mg/dL).   Medical History: Past Medical History:  Diagnosis Date   Arthritis    bil hips   Heart murmur     Medications:  No medications prior to admission.   Scheduled:   Assessment: 63 yo male with CT showing PE also also noted with CVA and metastatic cancer. Pharmacy consulted to dose heparin.  Heparin level came back subtherapeutic at 0.12, on heparin infusion at 1100 units/hr. No s/sx of bleeding or infusion issues.   Goal of Therapy:  Heparin level: 0.3-0.5 Monitor platelets by anticoagulation protocol: Yes   Plan:  -No heparin bolus given low goal range  -Increase heparin infusion at 1300 units/hr -Heparin level in 6hrs  Thank you for allowing pharmacy to participate in this patient's care,  Suzen Sour, PharmD, BCCCP Clinical Pharmacist  Phone: [phone] 02/15/2024 9:30 PM  Please check AMION for all [facility] Pharmacy phone numbers After 10:00 PM, call Main Pharmacy [phone]

## 2024-02-15 NOTE — Progress Notes (Addendum)
 HD#1 SUBJECTIVE:  Patient Summary: Riley Wright is a 63 y.o. with a pertinent no known PMH, who presented with receptive and expressive aphasia and admitted for embolic stroke workup and found to have a pulmonary embolus and metastatic cancer.   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 Deferred heparin as possibility of petechial hemorrhages in the brain.  Interim History: Riley Wright reports that he feels well this morning. He has no concerns. He still seems to only intermittently comprehend questions and sometimes gives responses that are not consistent with the question being asked. He denies chest pain, shortness of breath, abdominal pain, weakness and numbness. He was informed of his diagnosis but it is unclear how much he was able to comprehend.  OBJECTIVE:  Vital Signs: Vitals:   02/14/24 1749 02/14/24 2007 02/14/24 2356 02/15/24 0502  BP: (!) 151/93 (!) 165/91 (!) 142/82 (!) 147/87  Pulse: 75 72 74 82  Resp: 18 18 17 17   Temp: 97.7 F (36.5 C) 98.4 F (36.9 C) 99.5 F (37.5 C) 99.9 F (37.7 C)  TempSrc: Oral Oral Oral Oral  SpO2: 99% 100% 100% 97%  Weight:      Height:       Supplemental O2: Room Air SpO2: 97 %  Filed Weights   02/14/24 1746  Weight: 94.2 kg    No intake or output data in the 24 hours ending 02/15/24 9357 Net IO Since Admission: No IO data has been entered for this period [02/15/24 0642]  Physical Exam: Physical Exam Constitutional:      General: He is not in acute distress.    Appearance: He is not ill-appearing.  Cardiovascular:     Rate and Rhythm: Normal rate and regular rhythm.     Heart sounds: No murmur heard.    No friction rub. No gallop.  Pulmonary:     Effort: Pulmonary effort is normal. No respiratory distress.     Breath sounds: No wheezing or rales.  Abdominal:     General: Abdomen is flat.     Tenderness: There is no abdominal tenderness.     Comments: Hepatomegaly   Musculoskeletal:        General: No tenderness. Normal range of motion.  Neurological:     Mental Status: He is alert.     Comments: Oriented to self only. Thinks he is at home and does not correctly state the year.  Unable to spell the word dog. Finger-nose testing intact. Impaired letter A recognition     Patient Lines/Drains/Airways Status     Active Line/Drains/Airways     Name Placement date Placement time Site Days   Peripheral IV 02/14/24 18 G Right Antecubital 02/14/24  1101  Antecubital  1            Pertinent labs and imaging:      Latest Ref Rng & Units 02/15/2024    5:07 AM 02/14/2024   10:28 AM 02/14/2024   10:24 AM  CBC  WBC 4.0 - 10.5 K/uL 13.5   11.8   Hemoglobin 13.0 - 17.0 g/dL 86.6  84.3  85.3   Hematocrit 39.0 - 52.0 % 40.2  46.0  44.8   Platelets 150 - 400 K/uL 118   142        Latest Ref Rng & Units 02/15/2024    5:07 AM 02/14/2024   10:28 AM 02/14/2024   10:24 AM  CMP  Glucose 70 - 99 mg/dL 99  115  117   BUN 8 - 23 mg/dL 11  15  13    Creatinine 0.61 - 1.24 mg/dL 8.98  9.09  8.88   Sodium 135 - 145 mmol/L 136  137  137   Potassium 3.5 - 5.1 mmol/L 3.7  3.8  3.8   Chloride 98 - 111 mmol/L 97  100  98   CO2 22 - 32 mmol/L 26   22   Calcium 8.9 - 10.3 mg/dL 8.6   9.1   Total Protein 6.5 - 8.1 g/dL 6.3   7.2   Total Bilirubin 0.0 - 1.2 mg/dL 2.1   2.2   Alkaline Phos 38 - 126 U/L 154   181   AST 15 - 41 U/L 66   60   ALT 0 - 44 U/L 57   55     CT CHEST ABDOMEN PELVIS W CONTRAST Result Date: 02/14/2024 CLINICAL DATA:  Concern for metastatic disease, multiple hepatic masses on ultrasound EXAM: CT CHEST, ABDOMEN, AND PELVIS WITH CONTRAST TECHNIQUE: Multidetector CT imaging of the chest, abdomen and pelvis was performed following the standard protocol during bolus administration of intravenous contrast. RADIATION DOSE REDUCTION: This exam was performed according to the departmental dose-optimization program which includes automated exposure  control, adjustment of the mA and/or kV according to patient size and/or use of iterative reconstruction technique. CONTRAST:  75mL OMNIPAQUE  IOHEXOL  350 MG/ML SOLN COMPARISON:  02/14/2024, 10/27/2019 FINDINGS: CT CHEST FINDINGS Cardiovascular: The heart is unremarkable without pericardial effusion. No evidence of thoracic aortic aneurysm or dissection. While not optimized for opacification of the pulmonary vasculature, filling defects are seen within the left upper and left lower lobe pulmonary arteries compatible with pulmonary emboli. Mediastinum/Nodes: No enlarged mediastinal, hilar, or axillary lymph nodes. Thyroid gland, trachea, and esophagus demonstrate no significant findings. Lungs/Pleura: Trace bilateral pleural effusions. There are innumerable bilateral subcentimeter pulmonary nodules concerning for metastatic disease given findings on previous ultrasound. Index nodule in the left upper lobe reference image 59/5 measures 5 mm. No pneumothorax. The central airways are patent. Musculoskeletal: There are no acute or destructive bony abnormalities. Prior healed left rib fractures. Reconstructed images demonstrate no additional findings. CT ABDOMEN PELVIS FINDINGS Hepatobiliary: Innumerable hypodense masses are seen throughout the liver compatible with metastatic disease. Index lesion in the left lobe measures 4.6 x 4.8 cm reference image 42/3. Gallbladder is decompressed without evidence of cholelithiasis or cholecystitis. No biliary duct dilation. Pancreas: Ill-defined hypodense mass within the pancreatic body measuring 4.9 x 2.4 cm, highly concerning for pancreatic neoplasm. No acute inflammatory changes. Spleen: Wedge-shaped peripheral hypodensities are seen within the spleen, the largest of which persists on delayed imaging, which could reflect splenic infarcts. No evidence of splenomegaly. Adrenals/Urinary Tract: Multiple simple appearing bilateral renal cysts, largest in the upper pole right kidney  measuring 8.1 cm. No specific imaging follow-up is recommended. No obstructive uropathy within either kidney. Excreted contrast from previous CT within the bladder lumen, with no evidence of filling defect. The adrenals are unremarkable. Stomach/Bowel: No bowel obstruction or ileus. Normal appendix right lower quadrant. No bowel wall thickening or inflammatory change. Vascular/Lymphatic: No discrete adenopathy within the abdomen or pelvis. No significant vascular findings. Reproductive: Prostate is enlarged, with hypertrophied median lobe resulting in mass effect upon the inferior wall the bladder. Other: No free fluid or free intraperitoneal gas. No abdominal wall hernia. Musculoskeletal: No acute or destructive bony abnormalities. Reconstructed images demonstrate no additional findings. IMPRESSION: 1. Incidental left-sided pulmonary emboli. No evidence of right heart strain. 2.  4.9 x 2.4 cm hypodense mass within the pancreatic body, highly concerning for pancreatic neoplasm. 3. Innumerable hypodense masses throughout the liver consistent with hepatic metastases. 4. Innumerable subcentimeter bilateral pulmonary nodules consistent with pulmonary metastases. Largest index nodule in the left upper lobe measures 5 mm. 5. Peripheral wedge-shaped hypodensities within the spleen, which could reflect splenic infarcts. 6. Trace bilateral pleural effusions. 7. Enlarged prostate. Critical Value/emergent results were called by telephone at the time of interpretation on 02/14/2024 at 8:10 pm to provider [facility] , who verbally acknowledged these results. Electronically Signed   By: Ozell Daring M.D.   On: 02/14/2024 20:16   US  Abdomen Limited RUQ (LIVER/GB) Result Date: 02/14/2024 EXAM: Right Upper Quadrant Abdominal Ultrasound 02/14/2024 05:10:53 PM TECHNIQUE: Real-time ultrasonography of the right upper quadrant of the abdomen was performed. COMPARISON: None available. CLINICAL HISTORY: Elevated AST (SGOT) 622358;  [id number] Elevated ALT measurement 394831. FINDINGS: LIVER: Multiple solid-appearing hepatic masses, the largest in the right hepatic lobe measuring up to 4.2 cm and the largest in the left hepatic lobe measuring 3.6 cm. Findings concerning for metastases. No intrahepatic biliary ductal dilatation. BILIARY SYSTEM: Gallbladder not definitively visualized. Common bile duct is within normal limits measuring . RIGHT KIDNEY: The right kidney is grossly unremarkable in appearances without evidence of hydronephrosis, echogenic calculi or worrisome mass lesions. PANCREAS: Visualized portions of the pancreas are unremarkable. OTHER: No right upper quadrant ascites. IMPRESSION: 1. Multiple solid-appearing hepatic masses, largest 4.2 cm in the right lobe and 3.6 cm in the left lobe, concerning for metastases. . 2. Gallbladder not definitively visualized. Electronically signed by: Franky Crease MD 02/14/2024 05:27 PM EDT RP Workstation: [id number]   CT ANGIO HEAD NECK W WO CM Result Date: 02/14/2024 EXAM: CTA HEAD AND NECK WITH CONTRAST 02/14/2024 01:34:00 PM TECHNIQUE: CTA of the head and neck was performed with the administration of 75 mL of iohexol  (OMNIPAQUE ) 350 MG/ML injection. Multiplanar 2D and/or 3D reformatted images are provided for review. Automated exposure control, iterative reconstruction, and/or weight based adjustment of the mA/kV was utilized to reduce the radiation dose to as low as reasonably achievable. Stenosis of the internal carotid arteries measured using NASCET criteria. COMPARISON: brain MRI 02/14/2024. CLINICAL HISTORY: Stroke/TIA, determine embolic source. FINDINGS: CTA NECK: AORTIC ARCH AND ARCH VESSELS: No dissection or arterial injury. No significant stenosis of the brachiocephalic or subclavian arteries. CERVICAL CAROTID ARTERIES: Minimal atherosclerotic calcification at the left carotid bifurcation. No hemodynamically significant stenosis of either carotid system by NASCET criteria. No  dissection or arterial injury. CERVICAL VERTEBRAL ARTERIES: Vertebral arteries are right dominant. No dissection, arterial injury, or significant stenosis. LUNGS AND MEDIASTINUM: Unremarkable. SOFT TISSUES: No acute abnormality. BONES: No acute abnormality. CTA HEAD: ANTERIOR CIRCULATION: No significant stenosis of the internal carotid arteries. No significant stenosis of the anterior cerebral arteries. No significant stenosis of the middle cerebral arteries. No aneurysm. POSTERIOR CIRCULATION: No significant stenosis of the posterior cerebral arteries. No significant stenosis of the basilar artery. No significant stenosis of the vertebral arteries. No aneurysm. OTHER: No dural venous sinus thrombosis on this non-dedicated study. IMPRESSION: 1. No large vessel occlusion, hemodynamically significant stenosis, or aneurysm in the head or neck. 2. Minimal atherosclerotic calcification at the left carotid bifurcation without hemodynamically significant stenosis. 3. Essentially no atherosclerotic disease of the visualized craniocervical arteries consider cardiac source/arrhythmia as a potential source of embolism infarcts. Correlation with echocardiogram suggested. Electronically signed by: Franky Stanford MD 02/14/2024 02:42 PM EDT RP Workstation: [id number]   MR BRAIN WO CONTRAST Result Date:  02/14/2024 EXAM: MRI BRAIN WITHOUT CONTRAST 02/14/2024 01:03:36 PM TECHNIQUE: Multiplanar multisequence MRI of the head/brain was performed without the administration of intravenous contrast. COMPARISON: None available. CLINICAL HISTORY: FINDINGS: BRAIN AND VENTRICLES: There are numerous foci of acute ischemia throughout both cerebral hemispheres and cerebellar hemispheres. The largest lesion is in the left thalamus. The burden of lesions is greater on the left than the right. Scattered chronic microhemorrhages in the peripheral cerebral hemispheres. Multifocal hyperintense T2-weighted signal within the cerebral white matter, most  commonly due to chronic small vessel disease. No mass. No midline shift. No hydrocephalus. The sella is unremarkable. Normal flow voids. ORBITS: No acute abnormality. SINUSES AND MASTOIDS: No acute abnormality. BONES AND SOFT TISSUES: Normal marrow signal. No acute soft tissue abnormality. IMPRESSION: 1. Numerous acute ischemic foci throughout both cerebral and cerebellar hemispheres, with greater burden on the left. This is consistent with an embolic process from a cardiac or proximal aortic source. 2. Multifocal cerebral white matter T2 hyperintensities consistent with chronic small vessel disease. Electronically signed by: Franky Stanford MD 02/14/2024 02:38 PM EDT RP Workstation: [id number]   CT HEAD WO CONTRAST Result Date: 02/14/2024 EXAM: CT HEAD WITHOUT CONTRAST 02/14/2024 11:16:00 AM TECHNIQUE: CT of the head was performed without the administration of intravenous contrast. Automated exposure control, iterative reconstruction, and/or weight based adjustment of the mA/kV was utilized to reduce the radiation dose to as low as reasonably achievable. COMPARISON: None available. CLINICAL HISTORY: Memory loss. FINDINGS: BRAIN AND VENTRICLES: No acute hemorrhage. There is an ovoid area of diminished attenuation present anteriorly within the left thalamus concerning for subacute ischemic infarct. There is also a focal area of diminished attenuation in the subcortical white matter of the left posterior temporal lobe seen on image 16 of series 3, which is concerning for subcortical infarct. There are also multiple areas of age indeterminate infarction within the cerebellar hemispheres bilaterally, worse on the left. There is no significant cerebral or cerebellar swelling. There is mild cerebral white matter disease. No hydrocephalus. No extra-axial collection. No mass effect or midline shift. ORBITS: No acute abnormality. SINUSES: No acute abnormality. SOFT TISSUES AND SKULL: No acute soft tissue abnormality. No  skull fracture. IMPRESSION: 1. Ovoid area of diminished attenuation in the left thalamus, concerning for subacute ischemic infarct. 2. Focal area of diminished attenuation in the subcortical white matter of the left posterior temporal lobe, concerning for subcortical infarct. 3. Multiple areas of age indeterminate infarction within the cerebellar hemispheres bilaterally, worse on the left. 4. Correlation with MRI of the brain and CT angiogram of the head and neck is suggested. Electronically signed by: Evalene Coho MD 02/14/2024 11:29 AM EDT RP Workstation: [id number]    ASSESSMENT/PLAN:  Assessment: Principal Problem:   Stroke (HCC) Active Problems:   Elevated ALT measurement   Elevated AST (SGOT)   Increased anion gap metabolic acidosis   Leukocytosis   Liver masses  MARSHA HILLMAN is a 63 y.o. with a pertinent no known PMH, who presented with receptive and expressive aphasia and admitted for embolic stroke workup and found to have a pulmonary embolus and metastatic cancer.   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 Hepatomegaly on physical exam. Elevated transaminases, bilirubin, and ALP. Synthetic function decreased as evidenced by elevated INR and low platelets. Due to hepatomegaly and elevated transaminases, liver ultrasound was ordered which revealed multiple solid-appearing hepatic masses, largest 4.2 cm in the right lobe and 3.6 cm in  the left lobe, concerning for metastases. Follow-up CT scan of the chest abdomen and pelvis showed 4.9 x 2.4 cm hypodense mass within the pancreatic body, highly concerning for pancreatic neoplasm with metastasis to the lung and liver. We will hold statin for now in the setting of his transaminitis. Today, platelets are decreased from 142 to 118, and INR increased from 1.3 to 1.5. Transaminases and bilirubin nearly unchanged.   -CMP/INR/CBC tomorrow -Monitor for  signs of bleeding. -GI consulted, will see the patient later today. -Oncology consulted, will see later today. -IR consulted for potential percutaneous liver biopsy, will likely not occur over the weekend due to non-emergent nature of case -Patient informed of diagnosis, but unclear how much he was able to comprehend in the setting of his receptive aphasia.  #Pulmonary embolus No increased work of breathing, saturating well on room air. Incidental left-sided pulmonary emboli without evidence of right heart strain found on chest CT for staging of the above neoplasm. Given concern for petechial hemorrhage did not start heparin after conversation with neurology.  -CT head now to evaluate for emerging hemorrhage in the setting of large burden of emboli -Consider starting anticoagulation depending on what the CT and MRI of the head shows, will likely start heparin after discussion with oncology.  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 Presented with deficits in comprehension and speech. No motor or sensory deficits. CN II-XII normal on my exam but neurology did note drooping of the left side of his face. CT showed subacute left thalamus infarct and multiple other infarcts.SABRA MRI showed numerous ischemic foci throughout cerebellar and cerebral hemispheres, concerning for embolic source. CTA showed minimal atherosclerotic calcification at the left carotid bifurcation which was not hemodynamically significant. Neurology have seen the patient, and their recommendations are below. Given potential diagnosis of liver metastasis, these most likely represent emboli from a hypercoagulable state.   -Neurology recommended workup for emboli             -HgbA1C, fasting lipid panel             -Frequent neuro checks             -Bilateral lower extremity venous duplex              -TTE, may need TEE, concern for marantic endocarditis             -Due to likely cardioembolic source, will discontinue DAPT and start heparin             -Telemetry  --MRI head with contrast to further evaluate ischemic infarcts -Defer starting statin for now due to abnormal liver enzymes. -PT/OT/SLP consult  #Starvation ketosis #Poor PO intake #Ketonuria Patient had reportedly not been eating a lot for the past week. No nausea, vomiting, diarrhea, or other GI symptoms. Anion gap elevated to 17 with low normal bicarbonate at 22. Ketones in urine. Could also be in the setting of ischemic infarcts. VBG showed pH of 7.41. BHB elevated to 1.63 and lactic acid normal.   - Regular diet  -Encourage oral intake after procedure.   #Leukocytosis  Elevated to 11.8 on admission. Paucity of infectious symptoms. Afebrile and hemodynamically stable. Possible reactive to ischemic emboli or in the setting of his malignancy. WBC 13.5 this morning   -Trend CBC  Best Practice: Diet: NPO VTE: enoxaparin (LOVENOX) injection 40 mg Start: 02/14/24 2000 SCDs Start: 02/14/24  1507 Code: Full  Disposition planning: Therapy Recs: Pending Family Contact: [name], to be notified. DISPO: Anticipated discharge in 1-2 days  pending further workup and prognosis.  Signature:  Melvenia Napoleon [facility]  6:42 AM, 02/15/2024  On Call pager [phone]

## 2024-02-15 NOTE — Progress Notes (Signed)
 SLP Cancellation Note  Patient Details Name: Riley Wright MRN: [id number] DOB: [birth year]   Cancelled treatment:       Reason Eval/Treat Not Completed: Patient at procedure or test/unavailable (MRI). SLP will f/u as able.    Damien Blumenthal, M.A., CCC-SLP Speech Language Pathology, Acute Rehabilitation Services  Secure Chat preferred [phone]  02/15/2024, 9:48 AM

## 2024-02-15 NOTE — Progress Notes (Signed)
 Per discussion with neurology regarding patient's MRI, multiple areas consistent with metastisis and the infarcts are non-enhancing. OK per anticoagulation per discussion with neurology and oncology.  -Start Heparin drip per pharmacy for PE indication -Monitor for signs of bleeding or acute neurologic symptoms -Pause heparin drip prior to liver biopsy with IR

## 2024-02-15 NOTE — Consult Note (Signed)
 Chief Complaint:  Liver lesions in the setting of likely pancreatic cancer  Procedure: Liver Lesion Biopsy   Referring Provider(s): Dr. Dayton Eastern  Supervising Physician: Philip Cornet  Patient Status: [facility] - In-pt  History of Present Illness: Riley Wright is a 63 y.o. male with no significant medical history who presented to the ED last night with concerns for altered speech and atypical behavior for the past week. Code Stroke initiated and initially concerning for embolic CVA. Additional imaging obtained which revealed left-sided PE, large pancreatic mass concerning for neoplasm, and several hepatic lesions concerning for metastases. Follow up MR Brain ordered. IR consulted for possible liver lesion biopsy. Case and images reviewed and approved by Dr. DELENA Philip.   Patient resting in bed in no acute distress with family at the bedside. States that he continues to have some abdominal pain with palpation, but denies any pain when resting. Denies any fevers/chills, nausea/vomiting, weight changes, chest pain, or shortness of breath. Patient was initially started on ASA due to concern for stroke; however, per Medicine team, Neurology more concerned for metastases to the brain vs stroke at this point and patient is able to safely come off of ASA and continue on Heparin gtt. All questions and concerns answered at the bedside.   Patient is Full Code  Past Medical History:  Diagnosis Date   Arthritis    bil hips   Heart murmur     Past Surgical History:  Procedure Laterality Date   LUMBAR LAMINECTOMY/DECOMPRESSION MICRODISCECTOMY Right 01/26/2015   Procedure: LUMBAR DECOMPRESSION L4-5 CENTRAL AND TO THE RIGHT;  Surgeon: Tanda Heading, MD;  Location: [facility];  Service: Orthopedics;  Laterality: Right;    Allergies: Patient has no known allergies.  Medications: Prior to Admission medications   Not on File     Family History  Problem Relation Age of Onset   Stroke Mother     Hypertension Mother    Hypertension Father     Social History   Socioeconomic History   Marital status: Married    Spouse name: Not on file   Number of children: Not on file   Years of education: Not on file   Highest education level: Not on file  Occupational History   Not on file  Tobacco Use   Smoking status: Former    Current packs/day: 0.00    Types: Cigarettes    Quit date: 04/16/2016    Years since quitting: 7.8   Smokeless tobacco: Never  Substance and Sexual Activity   Alcohol use: Yes    Alcohol/week: 2.0 standard drinks of alcohol    Types: 2 Standard drinks or equivalent per week    Comment: a couple beers a night   Drug use: No   Sexual activity: Not on file  Other Topics Concern   Not on file  Social History Narrative   Not on file   Social Drivers of Health   Financial Resource Strain: Not on file  Food Insecurity: Low Risk  (09/17/2022)   Received from [facility]   Hunger Vital Sign    Within the past 12 months, you worried that your food would run out before you got money to buy more: Never true    Within the past 12 months, the food you bought just didn't last and you didn't have money to get more. : Never true  Transportation Needs: No Transportation Needs (09/17/2022)   Received from [facility]    In  the past 12 months, has lack of reliable transportation kept you from medical appointments, meetings, work or from getting things needed for daily living? : No  Physical Activity: Not on file  Stress: Not on file  Social Connections: Not on file    Review of Systems Patient denies any headache, chest pain, shortness of breath, abdominal pain, N/V, or fever/chills. All other systems are negative.   Vital Signs: BP (!) 132/91 (BP Location: Left Arm)   Pulse 74   Temp 98.7 F (37.1 C) (Oral)   Resp (!) 22   Ht 6' 2 (1.88 m)   Wt 207 lb 9.6 oz (94.2 kg)   SpO2 99%   BMI 26.65 kg/m    Physical Exam Constitutional:       Appearance: Normal appearance.  HENT:     Mouth/Throat:     Mouth: Mucous membranes are moist.     Pharynx: Oropharynx is clear.  Cardiovascular:     Rate and Rhythm: Normal rate and regular rhythm.     Heart sounds: Normal heart sounds.  Pulmonary:     Effort: Pulmonary effort is normal.     Breath sounds: Normal breath sounds.  Abdominal:     General: Abdomen is flat.     Palpations: Abdomen is soft.     Tenderness: There is abdominal tenderness (epigastric). There is right CVA tenderness.  Musculoskeletal:     Comments: Decreased ROM throughout, particularly RUE  Skin:    General: Skin is warm and dry.  Neurological:     Mental Status: He is alert and oriented to person, place, and time.  Psychiatric:        Behavior: Behavior normal.     Imaging: ECHOCARDIOGRAM COMPLETE BUBBLE STUDY Result Date: 02/15/2024    ECHOCARDIOGRAM REPORT   Patient Name:   Riley Wright Date of Exam: 02/15/2024 Medical Rec #:  [id number]     Height:       74.0 in Accession #:    [id number]    Weight:       207.6 lb Date of Birth:  September 29, 1960     BSA:          2.208 m Patient Age:    63 years      BP:           138/88 mmHg Patient Gender: M             HR:           72 bpm. Exam Location:  Inpatient Procedure: 2D Echo, Cardiac Doppler, Color Doppler and Saline Contrast Bubble            Study (Both Spectral and Color Flow Doppler were utilized during            procedure). Indications:    Stroke  History:        Patient has no prior history of Echocardiogram examinations.  Sonographer:    Philomena Daring Referring Phys: DEVON SHAFER IMPRESSIONS  1. Left ventricular ejection fraction, by estimation, is 60 to 65%. The left ventricle has normal function. The left ventricle has no regional wall motion abnormalities. There is mild left ventricular hypertrophy. Left ventricular diastolic parameters are consistent with Grade I diastolic dysfunction (impaired relaxation).  2. Right ventricular systolic function is normal.  The right ventricular size is normal. Tricuspid regurgitation signal is inadequate for assessing PA pressure.  3. A small pericardial effusion is present. The pericardial effusion is circumferential.  4. The mitral valve is  normal in structure. No evidence of mitral valve regurgitation. No evidence of mitral stenosis.  5. The aortic valve is tricuspid. Aortic valve regurgitation is not visualized. No aortic stenosis is present.  6. Agitated saline contrast bubble study was negative, with no evidence of any interatrial shunt. Comparison(s): No prior Echocardiogram. FINDINGS  Left Ventricle: Left ventricular ejection fraction, by estimation, is 60 to 65%. The left ventricle has normal function. The left ventricle has no regional wall motion abnormalities. Strain was performed and the global longitudinal strain is indeterminate. The left ventricular internal cavity size was normal in size. There is mild left ventricular hypertrophy. Left ventricular diastolic parameters are consistent with Grade I diastolic dysfunction (impaired relaxation). Right Ventricle: The right ventricular size is normal. No increase in right ventricular wall thickness. Right ventricular systolic function is normal. Tricuspid regurgitation signal is inadequate for assessing PA pressure. Left [facility]: Left atrial size was normal in size. Right [facility]: Right atrial size was normal in size. Pericardium: A small pericardial effusion is present. The pericardial effusion is circumferential. Mitral Valve: The mitral valve is normal in structure. No evidence of mitral valve regurgitation. No evidence of mitral valve stenosis. Tricuspid Valve: The tricuspid valve is normal in structure. Tricuspid valve regurgitation is not demonstrated. No evidence of tricuspid stenosis. Aortic Valve: The aortic valve is tricuspid. Aortic valve regurgitation is not visualized. No aortic stenosis is present. Pulmonic Valve: The pulmonic valve was not well visualized.  Pulmonic valve regurgitation is trivial. No evidence of pulmonic stenosis. Aorta: The aortic root is normal in size and structure. Venous: The inferior vena cava was not well visualized. IAS/Shunts: No atrial level shunt detected by color flow Doppler. Agitated saline contrast was given intravenously to evaluate for intracardiac shunting. Agitated saline contrast bubble study was negative, with no evidence of any interatrial shunt. Additional Comments: 3D was performed not requiring image post processing on an independent workstation and was indeterminate.  LEFT VENTRICLE PLAX 2D LVIDd:         3.80 cm   Diastology LVIDs:         2.40 cm   LV e' medial:    6.42 cm/s LV PW:         1.20 cm   LV E/e' medial:  10.9 LV IVS:        1.20 cm   LV e' lateral:   9.68 cm/s LVOT diam:     2.20 cm   LV E/e' lateral: 7.2 LV SV:         77 LV SV Index:   35 LVOT Area:     3.80 cm  RIGHT VENTRICLE             IVC RV S prime:     17.30 cm/s  IVC diam: 1.20 cm TAPSE (M-mode): 2.6 cm LEFT ATRIUM             Index        RIGHT ATRIUM           Index LA diam:        3.20 cm 1.45 cm/m   RA Area:     12.90 cm LA Vol (A2C):   30.7 ml 13.90 ml/m  RA Volume:   30.60 ml  13.86 ml/m LA Vol (A4C):   34.3 ml 15.53 ml/m LA Biplane Vol: 33.7 ml 15.26 ml/m  AORTIC VALVE LVOT Vmax:   110.00 cm/s LVOT Vmean:  70.400 cm/s LVOT VTI:    0.202 m  AORTA Ao Root  diam: 2.90 cm Ao Asc diam:  3.10 cm MITRAL VALVE MV Area (PHT): 3.53 cm    SHUNTS MV Decel Time: 215 msec    Systemic VTI:  0.20 m MV E velocity: 69.80 cm/s  Systemic Diam: 2.20 cm MV A velocity: 80.90 cm/s MV E/A ratio:  0.86 Vishnu Priya Mallipeddi Electronically signed by Diannah Late Mallipeddi Signature Date/Time: 02/15/2024/1:22:06 PM    Final    MR BRAIN W CONTRAST Result Date: 02/15/2024 EXAM: MR Brain with Intravenous Contrast. CLINICAL HISTORY: r/o brain mets. TECHNIQUE: Magnetic resonance images of the brain with intravenous contrast in multiple planes. CONTRAST: With; 9.4 mL  gadobutrol (GADAVIST) 1 MMOL/ML injection. COMPARISON: MRI of the head dated 02/14/2024. FINDINGS: BRAIN: No restricted diffusion to indicate acute infarction. Numerous nodular and linear foci of contrast enhancement present within the cerebral cortex and subcortical white matter bilaterally, including on images 29, 31, 33, 34, 36, 37, 44, 45, 47, and 49 of series 4. There are no ring enhancing lesions. There are numerous areas of ischemia with no associated contrast enhancement. There is some enhancement along the periphery of the lacunar infarct within the left thalamus. No intracranial mass or hemorrhage. No midline shift or extra-axial fluid collection. No cerebellar tonsillar ectopia. The central arterial and venous flow voids are patent. VENTRICLES: No hydrocephalus. ORBITS: The orbits are normal. SINUSES AND MASTOIDS: The sinuses and mastoid air cells are clear. BONES: No acute fracture or focal osseous lesion. IMPRESSION: 1. Numerous nodular and linear foci of contrast enhancement within the cerebral cortex and subcortical white matter bilaterally, without ring enhancement. It is not clear whether this represents luxury perfusion from ischemia. There are multiple areas of infarction that demonstrated no associated enhancement. Recommend follow up MRI of the brain without and with gadolinium contrast in approximately 1 week. 2. Numerous areas of ischemia without associated contrast enhancement. 3. Enhancement along the periphery of the lacunar infarct within the left thalamus. Electronically signed by: Evalene Coho MD 02/15/2024 10:42 AM EDT RP Workstation: [id number]   CT HEAD WO CONTRAST ( ) Result Date: 02/15/2024 EXAM: CT HEAD WITHOUT CONTRAST 02/15/2024 09:26:05 AM TECHNIQUE: CT of the head was performed without the administration of intravenous contrast. Automated exposure control, iterative reconstruction, and/or weight based adjustment of the mA/kV was utilized to reduce the radiation dose to  as low as reasonably achievable. COMPARISON: 02/14/2024 CLINICAL HISTORY: Follow up Stroke, looking for any bleeding before starting Heparin. FINDINGS: BRAIN AND VENTRICLES: No acute hemorrhage. 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 No hydrocephalus. No extra-axial collection. No mass effect or midline shift. ORBITS: No acute abnormality. SINUSES: No acute abnormality. SOFT TISSUES AND SKULL: No acute soft tissue abnormality. No skull fracture. IMPRESSION: 1. 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 2. Unchanged left cerebellar infarcts. Electronically signed by: Evalene Coho MD 02/15/2024 10:28 AM EDT RP Workstation: [id number]   CT CHEST ABDOMEN PELVIS W CONTRAST Result Date: 02/14/2024 CLINICAL DATA:  Concern for metastatic disease, multiple hepatic masses on ultrasound EXAM: CT CHEST, ABDOMEN, AND PELVIS WITH CONTRAST TECHNIQUE: Multidetector CT imaging of the chest, abdomen and pelvis was performed following the standard protocol during bolus administration of intravenous contrast. RADIATION DOSE REDUCTION: This exam was performed according to the departmental dose-optimization program which includes automated exposure control, adjustment of the mA and/or kV according to patient size and/or use of iterative reconstruction technique. CONTRAST:  75mL OMNIPAQUE  IOHEXOL  350 MG/ML SOLN COMPARISON:  02/14/2024, 10/27/2019 FINDINGS: CT CHEST FINDINGS Cardiovascular: The heart is unremarkable without  pericardial effusion. No evidence of thoracic aortic aneurysm or dissection. While not optimized for opacification of the pulmonary vasculature, filling defects are seen within the left upper and left lower lobe pulmonary arteries compatible with pulmonary emboli. Mediastinum/Nodes: No enlarged mediastinal, hilar, or axillary lymph nodes. Thyroid gland, trachea, and esophagus demonstrate no significant findings. Lungs/Pleura:  Trace bilateral pleural effusions. There are innumerable bilateral subcentimeter pulmonary nodules concerning for metastatic disease given findings on previous ultrasound. Index nodule in the left upper lobe reference image 59/5 measures 5 mm. No pneumothorax. The central airways are patent. Musculoskeletal: There are no acute or destructive bony abnormalities. Prior healed left rib fractures. Reconstructed images demonstrate no additional findings. CT ABDOMEN PELVIS FINDINGS Hepatobiliary: Innumerable hypodense masses are seen throughout the liver compatible with metastatic disease. Index lesion in the left lobe measures 4.6 x 4.8 cm reference image 42/3. Gallbladder is decompressed without evidence of cholelithiasis or cholecystitis. No biliary duct dilation. Pancreas: Ill-defined hypodense mass within the pancreatic body measuring 4.9 x 2.4 cm, highly concerning for pancreatic neoplasm. No acute inflammatory changes. Spleen: Wedge-shaped peripheral hypodensities are seen within the spleen, the largest of which persists on delayed imaging, which could reflect splenic infarcts. No evidence of splenomegaly. Adrenals/Urinary Tract: Multiple simple appearing bilateral renal cysts, largest in the upper pole right kidney measuring 8.1 cm. No specific imaging follow-up is recommended. No obstructive uropathy within either kidney. Excreted contrast from previous CT within the bladder lumen, with no evidence of filling defect. The adrenals are unremarkable. Stomach/Bowel: No bowel obstruction or ileus. Normal appendix right lower quadrant. No bowel wall thickening or inflammatory change. Vascular/Lymphatic: No discrete adenopathy within the abdomen or pelvis. No significant vascular findings. Reproductive: Prostate is enlarged, with hypertrophied median lobe resulting in mass effect upon the inferior wall the bladder. Other: No free fluid or free intraperitoneal gas. No abdominal wall hernia. Musculoskeletal: No acute or  destructive bony abnormalities. Reconstructed images demonstrate no additional findings. IMPRESSION: 1. Incidental left-sided pulmonary emboli. No evidence of right heart strain. 2. 4.9 x 2.4 cm hypodense mass within the pancreatic body, highly concerning for pancreatic neoplasm. 3. Innumerable hypodense masses throughout the liver consistent with hepatic metastases. 4. Innumerable subcentimeter bilateral pulmonary nodules consistent with pulmonary metastases. Largest index nodule in the left upper lobe measures 5 mm. 5. Peripheral wedge-shaped hypodensities within the spleen, which could reflect splenic infarcts. 6. Trace bilateral pleural effusions. 7. Enlarged prostate. Critical Value/emergent results were called by telephone at the time of interpretation on 02/14/2024 at 8:10 pm to provider [facility] , who verbally acknowledged these results. Electronically Signed   By: Ozell Daring M.D.   On: 02/14/2024 20:16   US  Abdomen Limited RUQ (LIVER/GB) Result Date: 02/14/2024 EXAM: Right Upper Quadrant Abdominal Ultrasound 02/14/2024 05:10:53 PM TECHNIQUE: Real-time ultrasonography of the right upper quadrant of the abdomen was performed. COMPARISON: None available. CLINICAL HISTORY: Elevated AST (SGOT) [id number]; [id number] Elevated ALT measurement [id number]. FINDINGS: LIVER: Multiple solid-appearing hepatic masses, the largest in the right hepatic lobe measuring up to 4.2 cm and the largest in the left hepatic lobe measuring 3.6 cm. Findings concerning for metastases. No intrahepatic biliary ductal dilatation. BILIARY SYSTEM: Gallbladder not definitively visualized. Common bile duct is within normal limits measuring . RIGHT KIDNEY: The right kidney is grossly unremarkable in appearances without evidence of hydronephrosis, echogenic calculi or worrisome mass lesions. PANCREAS: Visualized portions of the pancreas are unremarkable. OTHER: No right upper quadrant ascites. IMPRESSION: 1. Multiple solid-appearing hepatic  masses, largest 4.2 cm in the right  lobe and 3.6 cm in the left lobe, concerning for metastases. . 2. Gallbladder not definitively visualized. Electronically signed by: Franky Crease MD 02/14/2024 05:27 PM EDT RP Workstation: [id number]   CT ANGIO HEAD NECK W WO CM Result Date: 02/14/2024 EXAM: CTA HEAD AND NECK WITH CONTRAST 02/14/2024 01:34:00 PM TECHNIQUE: CTA of the head and neck was performed with the administration of 75 mL of iohexol  (OMNIPAQUE ) 350 MG/ML injection. Multiplanar 2D and/or 3D reformatted images are provided for review. Automated exposure control, iterative reconstruction, and/or weight based adjustment of the mA/kV was utilized to reduce the radiation dose to as low as reasonably achievable. Stenosis of the internal carotid arteries measured using NASCET criteria. COMPARISON: brain MRI 02/14/2024. CLINICAL HISTORY: Stroke/TIA, determine embolic source. FINDINGS: CTA NECK: AORTIC ARCH AND ARCH VESSELS: No dissection or arterial injury. No significant stenosis of the brachiocephalic or subclavian arteries. CERVICAL CAROTID ARTERIES: Minimal atherosclerotic calcification at the left carotid bifurcation. No hemodynamically significant stenosis of either carotid system by NASCET criteria. No dissection or arterial injury. CERVICAL VERTEBRAL ARTERIES: Vertebral arteries are right dominant. No dissection, arterial injury, or significant stenosis. LUNGS AND MEDIASTINUM: Unremarkable. SOFT TISSUES: No acute abnormality. BONES: No acute abnormality. CTA HEAD: ANTERIOR CIRCULATION: No significant stenosis of the internal carotid arteries. No significant stenosis of the anterior cerebral arteries. No significant stenosis of the middle cerebral arteries. No aneurysm. POSTERIOR CIRCULATION: No significant stenosis of the posterior cerebral arteries. No significant stenosis of the basilar artery. No significant stenosis of the vertebral arteries. No aneurysm. OTHER: No dural venous sinus thrombosis on this  non-dedicated study. IMPRESSION: 1. No large vessel occlusion, hemodynamically significant stenosis, or aneurysm in the head or neck. 2. Minimal atherosclerotic calcification at the left carotid bifurcation without hemodynamically significant stenosis. 3. Essentially no atherosclerotic disease of the visualized craniocervical arteries consider cardiac source/arrhythmia as a potential source of embolism infarcts. Correlation with echocardiogram suggested. Electronically signed by: Franky Stanford MD 02/14/2024 02:42 PM EDT RP Workstation: [id number]   MR BRAIN WO CONTRAST Result Date: 02/14/2024 EXAM: MRI BRAIN WITHOUT CONTRAST 02/14/2024 01:03:36 PM TECHNIQUE: Multiplanar multisequence MRI of the head/brain was performed without the administration of intravenous contrast. COMPARISON: None available. CLINICAL HISTORY: FINDINGS: BRAIN AND VENTRICLES: There are numerous foci of acute ischemia throughout both cerebral hemispheres and cerebellar hemispheres. The largest lesion is in the left thalamus. The burden of lesions is greater on the left than the right. Scattered chronic microhemorrhages in the peripheral cerebral hemispheres. Multifocal hyperintense T2-weighted signal within the cerebral white matter, most commonly due to chronic small vessel disease. No mass. No midline shift. No hydrocephalus. The sella is unremarkable. Normal flow voids. ORBITS: No acute abnormality. SINUSES AND MASTOIDS: No acute abnormality. BONES AND SOFT TISSUES: Normal marrow signal. No acute soft tissue abnormality. IMPRESSION: 1. Numerous acute ischemic foci throughout both cerebral and cerebellar hemispheres, with greater burden on the left. This is consistent with an embolic process from a cardiac or proximal aortic source. 2. Multifocal cerebral white matter T2 hyperintensities consistent with chronic small vessel disease. Electronically signed by: Franky Stanford MD 02/14/2024 02:38 PM EDT RP Workstation: [id number]   CT HEAD WO  CONTRAST Result Date: 02/14/2024 EXAM: CT HEAD WITHOUT CONTRAST 02/14/2024 11:16:00 AM TECHNIQUE: CT of the head was performed without the administration of intravenous contrast. Automated exposure control, iterative reconstruction, and/or weight based adjustment of the mA/kV was utilized to reduce the radiation dose to as low as reasonably achievable. COMPARISON: None available. CLINICAL HISTORY: Memory loss. FINDINGS: BRAIN AND VENTRICLES: No  acute hemorrhage. There is an ovoid area of diminished attenuation present anteriorly within the left thalamus concerning for subacute ischemic infarct. There is also a focal area of diminished attenuation in the subcortical white matter of the left posterior temporal lobe seen on image 16 of series 3, which is concerning for subcortical infarct. There are also multiple areas of age indeterminate infarction within the cerebellar hemispheres bilaterally, worse on the left. There is no significant cerebral or cerebellar swelling. There is mild cerebral white matter disease. No hydrocephalus. No extra-axial collection. No mass effect or midline shift. ORBITS: No acute abnormality. SINUSES: No acute abnormality. SOFT TISSUES AND SKULL: No acute soft tissue abnormality. No skull fracture. IMPRESSION: 1. Ovoid area of diminished attenuation in the left thalamus, concerning for subacute ischemic infarct. 2. Focal area of diminished attenuation in the subcortical white matter of the left posterior temporal lobe, concerning for subcortical infarct. 3. Multiple areas of age indeterminate infarction within the cerebellar hemispheres bilaterally, worse on the left. 4. Correlation with MRI of the brain and CT angiogram of the head and neck is suggested. Electronically signed by: Evalene Coho MD 02/14/2024 11:29 AM EDT RP Workstation: [id number]    Labs:  CBC: Recent Labs    02/14/24 1024 02/14/24 1028 02/15/24 0507  WBC 11.8*  --  13.5*  HGB 14.6 15.6 13.3  HCT 44.8  46.0 40.2  PLT 142*  --  118*    COAGS: Recent Labs    02/14/24 1024 02/15/24 0507  INR 1.3* 1.5*  APTT 29  --     BMP: Recent Labs    02/14/24 1024 02/14/24 1028 02/15/24 0507  NA 137 137 136  K 3.8 3.8 3.7  CL 98 100 97*  CO2 22  --  26  GLUCOSE 117* 115* 99  BUN 13 15 11   CALCIUM 9.1  --  8.6*  CREATININE 1.11 0.90 1.01  GFRNONAA >60  --  >60    LIVER FUNCTION TESTS: Recent Labs    02/14/24 1024 02/15/24 0507  BILITOT 2.2* 2.1*  AST 60* 66*  ALT 55* 57*  ALKPHOS 181* 154*  PROT 7.2 6.3*  ALBUMIN 3.6 3.1*    TUMOR MARKERS: No results for input(s): AFPTM, CEA, CA199, CHROMGRNA in the last 8760 hours.  Assessment and Plan:  Liver lesions in the setting of likely Pancreatic Cancer: Riley Wright is a 63 y.o. male with no significant history who presented to the ED for abnormal speech and behavior for the past week. Workup significant for PE, pancreatic mass, and hepatic lesions concerning for metastases. IR consulted for liver lesion biopsy which was approved by Dr. DELENA Balder.  Procedure to be performed under moderate sedation.  -Patient previously on ASA; per Neuro/Onc/Medicine team, okay for pt to hold ASA and continue on heparin gtt until biopsy -Discussed with pt that biopsy would not take place until Friday 11/7 -NPO at midnight on 11/7 -Care order for Heparin to be held at 5am on Friday -CBC and INR to be repeated Friday morning -Plan for procedure on 11/7 -Please reach out to IR regarding any changes in anticoagulation or discharge timeline  Risks and benefits of liver biopsy was discussed with the patient and/or patient's family including, but not limited to bleeding, infection, damage to adjacent structures or low yield requiring additional tests.  All of the questions were answered and there is agreement to proceed.  Consent signed and in chart.  Thank you for allowing our service to participate in Adwolf  Yepez 's care.     Electronically Signed: Kam Rahimi M Freedom Lopezperez, PA-C   02/15/2024, 2:01 PM     I spent a total of 40 Minutes  in face to face in clinical consultation, greater than 50% of which was counseling/coordinating care for liver lesion biopsy.

## 2024-02-15 NOTE — Progress Notes (Signed)
 OT Cancellation Note  Patient Details Name: Riley Wright MRN: [id number] DOB: 11-Nov-1960   Cancelled Treatment:    Reason Eval/Treat Not Completed: Medical issues which prohibited therapy Patient with acute PE complicated by concern for small petechial hemorrhage. Patient with CT pending. OT will hold on evaluation until medically appropriate to proceed.   Ronal Gift E. Ismelda Weatherman, OTR/L Acute Rehabilitation Services [phone]   Ronal Gift Salt 02/15/2024, 8:03 AM

## 2024-02-15 NOTE — Plan of Care (Signed)
  Problem: Education: Goal: Knowledge of disease or condition will improve Outcome: Progressing Goal: Knowledge of secondary prevention will improve (MUST DOCUMENT ALL) Outcome: Progressing   Problem: Ischemic Stroke/TIA Tissue Perfusion: Goal: Complications of ischemic stroke/TIA will be minimized Outcome: Progressing   Problem: Health Behavior/Discharge Planning: Goal: Goals will be collaboratively established with patient/family Outcome: Progressing

## 2024-02-15 NOTE — Consult Note (Signed)
 New Hematology/Oncology Consult   Requesting MD: Camellia Null       Reason for Consult: Pancreas mass, multiple embolic strokes  HPI: Riley Wright presented to the emergency room 02/14/2024 with confusion and aphasia.  He was evaluated by the neurology service.  CT and MRI imaging of the brain revealed multivessel distribution CVAs.  The liver enzymes and bilirubin were elevated.  CTs of the chest, abdomen, and pelvis confirmed a pancreas body mass, liver metastases, and lung nodules.  He was admitted for further evaluation.  Riley Wright is confused.  No specific complaint.  A friend was present when I saw him at approximately 9 AM.  The friend reports Riley Wright is confused.     Past Medical History:  Diagnosis Date   Arthritis    bil hips   Heart murmur   :   Past Surgical History:  Procedure Laterality Date   LUMBAR LAMINECTOMY/DECOMPRESSION MICRODISCECTOMY Right 01/26/2015   Procedure: LUMBAR DECOMPRESSION L4-5 CENTRAL AND TO THE RIGHT;  Surgeon: Tanda Heading, MD;  Location: [facility];  Service: Orthopedics;  Laterality: Right;  :   Current Facility-Administered Medications:    aspirin chewable tablet 81 mg, 81 mg, Oral, Daily, Waddell Aquas A, NP, 81 mg at 02/15/24 1004   enoxaparin (LOVENOX) injection 40 mg, 40 mg, Subcutaneous, Q24H, Bender, Emily, DO:   aspirin  81 mg Oral Daily   enoxaparin (LOVENOX) injection  40 mg Subcutaneous Q24H  :  No Known Allergies:  FH: Unable to obtain history  SOCIAL HISTORY: He lives with his son.  Drinks alcohol on the weekends.   Review of systems: Difficult to obtain due to patient's aphasia and confusion  Positives include:  A complete ROS was otherwise negative.   Physical Exam:  Blood pressure 138/88, pulse 73, temperature 98.7 F (37.1 C), temperature source Oral, resp. rate (!) 22, height 6' 2 (1.88 m), weight 207 lb 9.6 oz (94.2 kg), SpO2 100%.  HEENT: No thrush Lungs: Decreased breath sounds at the lower posterior  chest bilaterally, no respiratory distress Cardiac: Regular rate and rhythm Abdomen: No discrete mass, firm fullness in the right upper abdomen, no splenomegaly  Vascular: No leg edema Lymph nodes: No cervical, supraclavicular, axillary, or inguinal nodes Neurologic: Not oriented to place or date.  Moves all extremities to command.  The motor exam appears intact in the upper and lower extremities bilaterally Skin: No rash Musculoskeletal: No spine tenderness  LABS:   Recent Labs    02/14/24 1024 02/14/24 1028 02/15/24 0507  WBC 11.8*  --  13.5*  HGB 14.6 15.6 13.3  HCT 44.8 46.0 40.2  PLT 142*  --  118*     Recent Labs    02/14/24 1024 02/14/24 1028 02/15/24 0507  NA 137 137 136  K 3.8 3.8 3.7  CL 98 100 97*  CO2 22  --  26  GLUCOSE 117* 115* 99  BUN 13 15 11   CREATININE 1.11 0.90 1.01  CALCIUM 9.1  --  8.6*      RADIOLOGY:  CT CHEST ABDOMEN PELVIS W CONTRAST Result Date: 02/14/2024 CLINICAL DATA:  Concern for metastatic disease, multiple hepatic masses on ultrasound EXAM: CT CHEST, ABDOMEN, AND PELVIS WITH CONTRAST TECHNIQUE: Multidetector CT imaging of the chest, abdomen and pelvis was performed following the standard protocol during bolus administration of intravenous contrast. RADIATION DOSE REDUCTION: This exam was performed according to the departmental dose-optimization program which includes automated exposure control, adjustment of the mA and/or kV according to patient size  and/or use of iterative reconstruction technique. CONTRAST:  75mL OMNIPAQUE  IOHEXOL  350 MG/ML SOLN COMPARISON:  02/14/2024, 10/27/2019 FINDINGS: CT CHEST FINDINGS Cardiovascular: The heart is unremarkable without pericardial effusion. No evidence of thoracic aortic aneurysm or dissection. While not optimized for opacification of the pulmonary vasculature, filling defects are seen within the left upper and left lower lobe pulmonary arteries compatible with pulmonary emboli. Mediastinum/Nodes:  No enlarged mediastinal, hilar, or axillary lymph nodes. Thyroid gland, trachea, and esophagus demonstrate no significant findings. Lungs/Pleura: Trace bilateral pleural effusions. There are innumerable bilateral subcentimeter pulmonary nodules concerning for metastatic disease given findings on previous ultrasound. Index nodule in the left upper lobe reference image 59/5 measures 5 mm. No pneumothorax. The central airways are patent. Musculoskeletal: There are no acute or destructive bony abnormalities. Prior healed left rib fractures. Reconstructed images demonstrate no additional findings. CT ABDOMEN PELVIS FINDINGS Hepatobiliary: Innumerable hypodense masses are seen throughout the liver compatible with metastatic disease. Index lesion in the left lobe measures 4.6 x 4.8 cm reference image 42/3. Gallbladder is decompressed without evidence of cholelithiasis or cholecystitis. No biliary duct dilation. Pancreas: Ill-defined hypodense mass within the pancreatic body measuring 4.9 x 2.4 cm, highly concerning for pancreatic neoplasm. No acute inflammatory changes. Spleen: Wedge-shaped peripheral hypodensities are seen within the spleen, the largest of which persists on delayed imaging, which could reflect splenic infarcts. No evidence of splenomegaly. Adrenals/Urinary Tract: Multiple simple appearing bilateral renal cysts, largest in the upper pole right kidney measuring 8.1 cm. No specific imaging follow-up is recommended. No obstructive uropathy within either kidney. Excreted contrast from previous CT within the bladder lumen, with no evidence of filling defect. The adrenals are unremarkable. Stomach/Bowel: No bowel obstruction or ileus. Normal appendix right lower quadrant. No bowel wall thickening or inflammatory change. Vascular/Lymphatic: No discrete adenopathy within the abdomen or pelvis. No significant vascular findings. Reproductive: Prostate is enlarged, with hypertrophied median lobe resulting in mass  effect upon the inferior wall the bladder. Other: No free fluid or free intraperitoneal gas. No abdominal wall hernia. Musculoskeletal: No acute or destructive bony abnormalities. Reconstructed images demonstrate no additional findings. IMPRESSION: 1. Incidental left-sided pulmonary emboli. No evidence of right heart strain. 2. 4.9 x 2.4 cm hypodense mass within the pancreatic body, highly concerning for pancreatic neoplasm. 3. Innumerable hypodense masses throughout the liver consistent with hepatic metastases. 4. Innumerable subcentimeter bilateral pulmonary nodules consistent with pulmonary metastases. Largest index nodule in the left upper lobe measures 5 mm. 5. Peripheral wedge-shaped hypodensities within the spleen, which could reflect splenic infarcts. 6. Trace bilateral pleural effusions. 7. Enlarged prostate. Critical Value/emergent results were called by telephone at the time of interpretation on 02/14/2024 at 8:10 pm to provider [facility] , who verbally acknowledged these results. Electronically Signed   By: Ozell Daring M.D.   On: 02/14/2024 20:16   US  Abdomen Limited RUQ (LIVER/GB) Result Date: 02/14/2024 EXAM: Right Upper Quadrant Abdominal Ultrasound 02/14/2024 05:10:53 PM TECHNIQUE: Real-time ultrasonography of the right upper quadrant of the abdomen was performed. COMPARISON: None available. CLINICAL HISTORY: Elevated AST (SGOT) [id number]; [id number] Elevated ALT measurement [id number]. FINDINGS: LIVER: Multiple solid-appearing hepatic masses, the largest in the right hepatic lobe measuring up to 4.2 cm and the largest in the left hepatic lobe measuring 3.6 cm. Findings concerning for metastases. No intrahepatic biliary ductal dilatation. BILIARY SYSTEM: Gallbladder not definitively visualized. Common bile duct is within normal limits measuring . RIGHT KIDNEY: The right kidney is grossly unremarkable in appearances without evidence of hydronephrosis, echogenic calculi or worrisome mass  lesions.  PANCREAS: Visualized portions of the pancreas are unremarkable. OTHER: No right upper quadrant ascites. IMPRESSION: 1. Multiple solid-appearing hepatic masses, largest 4.2 cm in the right lobe and 3.6 cm in the left lobe, concerning for metastases. . 2. Gallbladder not definitively visualized. Electronically signed by: Franky Crease MD 02/14/2024 05:27 PM EDT RP Workstation: [id number]   CT ANGIO HEAD NECK W WO CM Result Date: 02/14/2024 EXAM: CTA HEAD AND NECK WITH CONTRAST 02/14/2024 01:34:00 PM TECHNIQUE: CTA of the head and neck was performed with the administration of 75 mL of iohexol  (OMNIPAQUE ) 350 MG/ML injection. Multiplanar 2D and/or 3D reformatted images are provided for review. Automated exposure control, iterative reconstruction, and/or weight based adjustment of the mA/kV was utilized to reduce the radiation dose to as low as reasonably achievable. Stenosis of the internal carotid arteries measured using NASCET criteria. COMPARISON: brain MRI 02/14/2024. CLINICAL HISTORY: Stroke/TIA, determine embolic source. FINDINGS: CTA NECK: AORTIC ARCH AND ARCH VESSELS: No dissection or arterial injury. No significant stenosis of the brachiocephalic or subclavian arteries. CERVICAL CAROTID ARTERIES: Minimal atherosclerotic calcification at the left carotid bifurcation. No hemodynamically significant stenosis of either carotid system by NASCET criteria. No dissection or arterial injury. CERVICAL VERTEBRAL ARTERIES: Vertebral arteries are right dominant. No dissection, arterial injury, or significant stenosis. LUNGS AND MEDIASTINUM: Unremarkable. SOFT TISSUES: No acute abnormality. BONES: No acute abnormality. CTA HEAD: ANTERIOR CIRCULATION: No significant stenosis of the internal carotid arteries. No significant stenosis of the anterior cerebral arteries. No significant stenosis of the middle cerebral arteries. No aneurysm. POSTERIOR CIRCULATION: No significant stenosis of the posterior cerebral arteries. No  significant stenosis of the basilar artery. No significant stenosis of the vertebral arteries. No aneurysm. OTHER: No dural venous sinus thrombosis on this non-dedicated study. IMPRESSION: 1. No large vessel occlusion, hemodynamically significant stenosis, or aneurysm in the head or neck. 2. Minimal atherosclerotic calcification at the left carotid bifurcation without hemodynamically significant stenosis. 3. Essentially no atherosclerotic disease of the visualized craniocervical arteries consider cardiac source/arrhythmia as a potential source of embolism infarcts. Correlation with echocardiogram suggested. Electronically signed by: Franky Stanford MD 02/14/2024 02:42 PM EDT RP Workstation: [id number]   MR BRAIN WO CONTRAST Result Date: 02/14/2024 EXAM: MRI BRAIN WITHOUT CONTRAST 02/14/2024 01:03:36 PM TECHNIQUE: Multiplanar multisequence MRI of the head/brain was performed without the administration of intravenous contrast. COMPARISON: None available. CLINICAL HISTORY: FINDINGS: BRAIN AND VENTRICLES: There are numerous foci of acute ischemia throughout both cerebral hemispheres and cerebellar hemispheres. The largest lesion is in the left thalamus. The burden of lesions is greater on the left than the right. Scattered chronic microhemorrhages in the peripheral cerebral hemispheres. Multifocal hyperintense T2-weighted signal within the cerebral white matter, most commonly due to chronic small vessel disease. No mass. No midline shift. No hydrocephalus. The sella is unremarkable. Normal flow voids. ORBITS: No acute abnormality. SINUSES AND MASTOIDS: No acute abnormality. BONES AND SOFT TISSUES: Normal marrow signal. No acute soft tissue abnormality. IMPRESSION: 1. Numerous acute ischemic foci throughout both cerebral and cerebellar hemispheres, with greater burden on the left. This is consistent with an embolic process from a cardiac or proximal aortic source. 2. Multifocal cerebral white matter T2 hyperintensities  consistent with chronic small vessel disease. Electronically signed by: Franky Stanford MD 02/14/2024 02:38 PM EDT RP Workstation: [id number]   CT HEAD WO CONTRAST Result Date: 02/14/2024 EXAM: CT HEAD WITHOUT CONTRAST 02/14/2024 11:16:00 AM TECHNIQUE: CT of the head was performed without the administration of intravenous contrast. Automated exposure control, iterative reconstruction, and/or weight based adjustment  of the mA/kV was utilized to reduce the radiation dose to as low as reasonably achievable. COMPARISON: None available. CLINICAL HISTORY: Memory loss. FINDINGS: BRAIN AND VENTRICLES: No acute hemorrhage. There is an ovoid area of diminished attenuation present anteriorly within the left thalamus concerning for subacute ischemic infarct. There is also a focal area of diminished attenuation in the subcortical white matter of the left posterior temporal lobe seen on image 16 of series 3, which is concerning for subcortical infarct. There are also multiple areas of age indeterminate infarction within the cerebellar hemispheres bilaterally, worse on the left. There is no significant cerebral or cerebellar swelling. There is mild cerebral white matter disease. No hydrocephalus. No extra-axial collection. No mass effect or midline shift. ORBITS: No acute abnormality. SINUSES: No acute abnormality. SOFT TISSUES AND SKULL: No acute soft tissue abnormality. No skull fracture. IMPRESSION: 1. Ovoid area of diminished attenuation in the left thalamus, concerning for subacute ischemic infarct. 2. Focal area of diminished attenuation in the subcortical white matter of the left posterior temporal lobe, concerning for subcortical infarct. 3. Multiple areas of age indeterminate infarction within the cerebellar hemispheres bilaterally, worse on the left. 4. Correlation with MRI of the brain and CT angiogram of the head and neck is suggested. Electronically signed by: Evalene Coho MD 02/14/2024 11:29 AM EDT RP  Workstation: [id number]    Assessment and Plan:   Pancreas mass, liver/lung lesions 02/14/2024 CTs: 4.9 x 2.4 cm hypodense mass in the pancreas body, innumerable hypodense liver metastases, innumerable subcentimeter bilateral pulmonary nodules, peripheral wedge-shaped hypodensities in the spleen 2.   Pulmonary embolism on CT chest 02/14/2024, no right heart strain 3.   Multivessel distribution CVAs 02/14/2024 CT head: Diminished attenuation in the left thalamus concerning for a subacute infarct, focal area of subcortical infarct in the left posterior temporal lobe, multiple areas of age-indeterminate infarction within cerebellar hemispheres-worse on the left 02/14/2024 MRI brain: Numerous acute ischemic foci in both cerebellar and cerebral hemispheres consistent with emboli, multifocal cerebral T2 hyperintensities consistent with chronic small vessel disease 4.  Elevated liver enzymes and bilirubin secondary to #1 5.  Thrombocytopenia 6.  Altered mental status/partial aphasia secondary to #2  Riley Wright presented with confusion and altered speech.  Imaging studies are consistent with multivessel distribution CVAs.  He has a pancreas body mass and liver/lung lesions.  The clinical presentation is consistent with metastatic pancreas cancer.  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  Riley Wright has partial aphasia and appears confused.  A friend was present when I saw him at approximately 11 AM.  I asked him to have his family here in the early a.m. on 02/17/2024.  His prognosis is poor if he is confirmed to have metastatic pancreas cancer.  Patients with metastatic pancreas cancer and a hypercoagulation syndrome have limited survival.  I recommend proceeding with a diagnostic biopsy and anticoagulation therapy.  We will discuss the indication for hospice care versus a trial of systemic therapy based on the molecular  characteristics of the tumor and his recovery from the CVAs.  Recommendations: Ultrasound-guided biopsy of a liver lesion to establish a diagnosis of metastatic pancreas cancer, tumor tissue to be submitted for MSI and molecular testing Stroke management per neurology Anticoagulation when okay with the neurology service, can start with heparin and hold for the liver biopsy, convert to a DOAC or Lovenox for long-term anticoagulation Lower extremity Dopplers Oncology will continue following him in the hospital and  outpatient follow-up will be scheduled the Cancer center.   Arley Hof, MD 02/15/2024, 10:20 AM

## 2024-02-15 NOTE — Progress Notes (Signed)
 PHARMACY - ANTICOAGULATION CONSULT NOTE  Pharmacy Consult for heparin  Indication: PE, splenic infarct  No Known Allergies  Patient Measurements: Height: 6' 2 (188 cm) Weight: 94.2 kg (207 lb 9.6 oz) IBW/kg (Calculated) : 82.2 HEPARIN DW (KG): 94.2  Vital Signs: Temp: 98.7 F (37.1 C) (11/01 1208) Temp Source: Oral (11/01 1208) BP: 132/91 (11/01 1208) Pulse Rate: 74 (11/01 1208)  Labs: Recent Labs    02/14/24 1024 02/14/24 1028 02/15/24 0507  HGB 14.6 15.6 13.3  HCT 44.8 46.0 40.2  PLT 142*  --  118*  APTT 29  --   --   LABPROT 16.5*  --  18.6*  INR 1.3*  --  1.5*  CREATININE 1.11 0.90 1.01    Estimated Creatinine Clearance: 88.2 mL/min (by C-G formula based on SCr of 1.01 mg/dL).   Medical History: Past Medical History:  Diagnosis Date   Arthritis    bil hips   Heart murmur     Medications:  No medications prior to admission.   Scheduled:   Assessment: 63 yo male with CT showing PE also also noted with CVA and metastatic cancer. Pharmacy consulted to dose heparin    Goal of Therapy:  Heparin level: 0.3-0.5 Monitor platelets by anticoagulation protocol: Yes   Plan:  -No heparin bolus -Start heparin at 1100 units/hr -Heparin level in 6hrs  Prentice Poisson, PharmD Clinical Pharmacist **Pharmacist phone directory can now be found on [email or web address] ([email or web address]).  Listed under [facility].

## 2024-02-16 ENCOUNTER — Inpatient Hospital Stay (HOSPITAL_COMMUNITY)

## 2024-02-16 DIAGNOSIS — I82443 Acute embolism and thrombosis of tibial vein, bilateral: Secondary | ICD-10-CM

## 2024-02-16 DIAGNOSIS — I63449 Cerebral infarction due to embolism of unspecified cerebellar artery: Secondary | ICD-10-CM | POA: Diagnosis not present

## 2024-02-16 DIAGNOSIS — R160 Hepatomegaly, not elsewhere classified: Secondary | ICD-10-CM | POA: Diagnosis not present

## 2024-02-16 DIAGNOSIS — I82409 Acute embolism and thrombosis of unspecified deep veins of unspecified lower extremity: Secondary | ICD-10-CM | POA: Diagnosis present

## 2024-02-16 DIAGNOSIS — K8689 Other specified diseases of pancreas: Secondary | ICD-10-CM | POA: Diagnosis not present

## 2024-02-16 DIAGNOSIS — R7401 Elevation of levels of liver transaminase levels: Secondary | ICD-10-CM | POA: Diagnosis not present

## 2024-02-16 DIAGNOSIS — R29705 NIHSS score 5: Secondary | ICD-10-CM | POA: Diagnosis not present

## 2024-02-16 DIAGNOSIS — C799 Secondary malignant neoplasm of unspecified site: Secondary | ICD-10-CM | POA: Insufficient documentation

## 2024-02-16 DIAGNOSIS — I639 Cerebral infarction, unspecified: Secondary | ICD-10-CM | POA: Diagnosis not present

## 2024-02-16 DIAGNOSIS — I69320 Aphasia following cerebral infarction: Secondary | ICD-10-CM | POA: Diagnosis not present

## 2024-02-16 DIAGNOSIS — R748 Abnormal levels of other serum enzymes: Secondary | ICD-10-CM | POA: Insufficient documentation

## 2024-02-16 DIAGNOSIS — I82453 Acute embolism and thrombosis of peroneal vein, bilateral: Secondary | ICD-10-CM

## 2024-02-16 DIAGNOSIS — I6340 Cerebral infarction due to embolism of unspecified cerebral artery: Secondary | ICD-10-CM | POA: Diagnosis not present

## 2024-02-16 DIAGNOSIS — D6869 Other thrombophilia: Secondary | ICD-10-CM | POA: Diagnosis not present

## 2024-02-16 LAB — COMPREHENSIVE METABOLIC PANEL WITH GFR
ALT: 69 U/L — ABNORMAL HIGH (ref 0–44)
AST: 73 U/L — ABNORMAL HIGH (ref 15–41)
Albumin: 3 g/dL — ABNORMAL LOW (ref 3.5–5.0)
Alkaline Phosphatase: 152 U/L — ABNORMAL HIGH (ref 38–126)
Anion gap: 13 (ref 5–15)
BUN: 12 mg/dL (ref 8–23)
CO2: 25 mmol/L (ref 22–32)
Calcium: 8.9 mg/dL (ref 8.9–10.3)
Chloride: 98 mmol/L (ref 98–111)
Creatinine, Ser: 1.01 mg/dL (ref 0.61–1.24)
GFR, Estimated: >60 mL/min (ref >60)
Glucose, Bld: 90 mg/dL (ref 70–99)
Potassium: 3.9 mmol/L (ref 3.5–5.1)
Sodium: 136 mmol/L (ref 135–145)
Total Bilirubin: 2.1 mg/dL — ABNORMAL HIGH (ref 0.0–1.2)
Total Protein: 6.4 g/dL — ABNORMAL LOW (ref 6.5–8.1)

## 2024-02-16 LAB — URINALYSIS, ROUTINE W REFLEX MICROSCOPIC
Bilirubin Urine: NEGATIVE
Glucose, UA: NEGATIVE mg/dL
Hgb urine dipstick: NEGATIVE
Ketones, ur: 20 mg/dL — AB
Leukocytes,Ua: NEGATIVE
Nitrite: NEGATIVE
Protein, ur: NEGATIVE mg/dL
Specific Gravity, Urine: 1.025 (ref 1.005–1.030)
pH: 5 (ref 5.0–8.0)

## 2024-02-16 LAB — CBC
HCT: 40.6 % (ref 39.0–52.0)
Hemoglobin: 13.1 g/dL (ref 13.0–17.0)
MCH: 29.5 pg (ref 26.0–34.0)
MCHC: 32.3 g/dL (ref 30.0–36.0)
MCV: 91.4 fL (ref 80.0–100.0)
Platelets: 132 K/uL — ABNORMAL LOW (ref 150–400)
RBC: 4.44 MIL/uL (ref 4.22–5.81)
RDW: 12.9 % (ref 11.5–15.5)
WBC: 12.9 K/uL — ABNORMAL HIGH (ref 4.0–10.5)
nRBC: 0 % (ref 0.0–0.2)

## 2024-02-16 LAB — HEPARIN LEVEL (UNFRACTIONATED)
Heparin Unfractionated: 0.19 [IU]/mL — ABNORMAL LOW (ref 0.30–0.70)
Heparin Unfractionated: 0.24 [IU]/mL — ABNORMAL LOW (ref 0.30–0.70)
Heparin Unfractionated: 0.25 [IU]/mL — ABNORMAL LOW (ref 0.30–0.70)

## 2024-02-16 LAB — PROTIME-INR
INR: 1.3 — ABNORMAL HIGH (ref 0.8–1.2)
Prothrombin Time: 16.6 s — ABNORMAL HIGH (ref 11.4–15.2)

## 2024-02-16 MED ORDER — HALOPERIDOL 0.5 MG PO TABS
0.5000 mg | ORAL_TABLET | Freq: Once | ORAL | Status: AC | PRN
  Administered 2024-02-16: 0.5 mg via ORAL
  Filled 2024-02-16: qty 1

## 2024-02-16 MED ORDER — HALOPERIDOL LACTATE 5 MG/ML IJ SOLN: 1.0000 mg | Freq: Once | INTRAMUSCULAR | Status: AC | PRN

## 2024-02-16 MED ORDER — OLANZAPINE 2.5 MG PO TABS
7.5000 mg | ORAL_TABLET | Freq: Once | ORAL | Status: AC
  Administered 2024-02-16: 7.5 mg via ORAL
  Filled 2024-02-16: qty 3

## 2024-02-16 NOTE — Progress Notes (Signed)
 OT Cancellation Note  Patient Details Name: Riley Wright MRN: [id number] DOB: 03-23-1961   Cancelled Treatment:    Reason Eval/Treat Not Completed: Medical issues which prohibited therapy (pt with PE, hepartin started 11/1 1455, not yet on anticoagulation x24 hours, will follow up for OT eval as appropriate.)  Lillieanna Tuohy K, OTD, OTR/L [facility] [phone]   Laneta POUR Koonce 02/16/2024, 7:41 AM

## 2024-02-16 NOTE — Progress Notes (Signed)
 VASCULAR LAB    TCD bubble study has been performed.  See CV proc for preliminary results.   Larell Baney, RVT 02/16/2024, 4:06 PM

## 2024-02-16 NOTE — Significant Event (Signed)
 Riley Wright is attempting to leave despite not understanding why it's necessary, likely due to a lack of awareness about his medical condition. This new  RN  tries but is unable to prevent him from attempting to leave, likely due to his mental status and lack of insight. His family is present at the bedside, and they were able to assist the RN in guiding him back to bed. The attending physician is now at the bedside, possibly involved in assessing Riley Wright condition and offering further care. A new condom catheter was applied, which may have been part of his care routine to manage urinary output. PRN medication  was administered as per the physician's orders, possibly to help calm or manage Riley Wright agitation or confusion.A new PIV  was started, likely to prepare for the bubble study ordered by Neurology. Family remains at bedside.

## 2024-02-16 NOTE — Progress Notes (Signed)
 IP PROGRESS NOTE  Subjective:   Mr. Riley Wright has no complaint.  No family was present when I saw him at approximately 6:15 AM  Objective: Vital signs in last 24 hours: Blood pressure 128/86, pulse 68, temperature 99.2 F (37.3 C), temperature source Oral, resp. rate 20, height 6' 2 (1.88 m), weight 207 lb 9.6 oz (94.2 kg), SpO2 98%.  Intake/Output from previous day: 11/01 0701 - 11/02 0700 In: 368.8 [P.O.:240; I.V.:128.8] Out: -   Physical Exam:  Neurologic: Alert, follows commands, moves extremities to command.  Not oriented to place or diagnosis.   Lab Results: Recent Labs    02/15/24 0507 02/16/24 0327  WBC 13.5* 12.9*  HGB 13.3 13.1  HCT 40.2 40.6  PLT 118* 132*    BMET Recent Labs    02/15/24 0507 02/16/24 0327  NA 136 136  K 3.7 3.9  CL 97* 98  CO2 26 25  GLUCOSE 99 90  BUN 11 12  CREATININE 1.01 1.01  CALCIUM 8.6* 8.9    No results found for: CEA1, CEA, CAN199, CA125  Studies/Results: VAS US  LOWER EXTREMITY VENOUS (DVT) Result Date: 02/15/2024  Lower Venous DVT Study Patient Name:  Riley Wright  Date of Exam:   02/15/2024 Medical Rec #: [id number]      Accession #:    [id number] Date of Birth: 1960/06/28      Patient Gender: M Patient Age:   63 years Exam Location:  [facility] Procedure:      VAS US  LOWER EXTREMITY VENOUS (DVT) Referring Phys: Riley Wright --------------------------------------------------------------------------------  Indications: Embolic stroke vs. brain metasstases, left sided pulmonary embolism in the setting of new, incidental finding of large pancreatic mass concerning for neoplasm and several hepatic lesions concerning for metastases.  Comparison Study: No prior study Performing Technologist: Riley Wright RVS  Examination Guidelines: A complete evaluation includes B-mode imaging, spectral Doppler, color Doppler, and power Doppler as needed of all accessible portions of each vessel. Bilateral testing is considered an  integral part of a complete examination. Limited examinations for reoccurring indications may be performed as noted. The reflux portion of the exam is performed with the patient in reverse Trendelenburg.  +---------+---------------+---------+-----------+----------+-------------------+ RIGHT    CompressibilityPhasicitySpontaneityPropertiesThrombus Aging      +---------+---------------+---------+-----------+----------+-------------------+ CFV      Full           Yes      Yes                                      +---------+---------------+---------+-----------+----------+-------------------+ SFJ      Full                                                             +---------+---------------+---------+-----------+----------+-------------------+ FV Prox  Full           Yes      No                                       +---------+---------------+---------+-----------+----------+-------------------+ FV Mid   Full           Yes      Yes                                      +---------+---------------+---------+-----------+----------+-------------------+  FV DistalFull                                                             +---------+---------------+---------+-----------+----------+-------------------+ PFV      Full                                                             +---------+---------------+---------+-----------+----------+-------------------+ POP      Full           Yes      Yes                  patent by color and                                                       Doppler             +---------+---------------+---------+-----------+----------+-------------------+ PTV      None                                         Acute               +---------+---------------+---------+-----------+----------+-------------------+ PERO     None                                         Acute                +---------+---------------+---------+-----------+----------+-------------------+   +---------+---------------+---------+-----------+----------+--------------+ LEFT     CompressibilityPhasicitySpontaneityPropertiesThrombus Aging +---------+---------------+---------+-----------+----------+--------------+ CFV      Full           Yes      Yes                                 +---------+---------------+---------+-----------+----------+--------------+ SFJ      Full                                                        +---------+---------------+---------+-----------+----------+--------------+ FV Prox  Full                                                        +---------+---------------+---------+-----------+----------+--------------+ FV Mid   Full           Yes      Yes                                 +---------+---------------+---------+-----------+----------+--------------+  FV DistalFull                                                        +---------+---------------+---------+-----------+----------+--------------+ PFV      Full                                                        +---------+---------------+---------+-----------+----------+--------------+ POP      Full           Yes      Yes                                 +---------+---------------+---------+-----------+----------+--------------+ PTV      None                                         Acute          +---------+---------------+---------+-----------+----------+--------------+ PERO     None                                         Acute          +---------+---------------+---------+-----------+----------+--------------+     Summary: RIGHT: - Findings consistent with acute deep vein thrombosis involving the right posterior tibial veins, and right peroneal veins.  - No cystic structure found in the popliteal fossa.  LEFT: - Findings consistent with acute deep vein thrombosis involving  the left posterior tibial veins, and left peroneal veins.  - No cystic structure found in the popliteal fossa.  *See table(s) above for measurements and observations.    Preliminary    ECHOCARDIOGRAM COMPLETE BUBBLE STUDY Result Date: 02/15/2024    ECHOCARDIOGRAM REPORT   Patient Name:   Riley Wright Date of Exam: 02/15/2024 Medical Rec #:  [id number]     Height:       74.0 in Accession #:    [id number]    Weight:       207.6 lb Date of Birth:  1961/02/23     BSA:          2.208 m Patient Age:    63 years      BP:           138/88 mmHg Patient Gender: M             HR:           72 bpm. Exam Location:  Inpatient Procedure: 2D Echo, Cardiac Doppler, Color Doppler and Saline Contrast Bubble            Study (Both Spectral and Color Flow Doppler were utilized during            procedure). Indications:    Stroke  History:        Patient has no prior history of Echocardiogram examinations.  Sonographer:    Riley Wright Referring Phys: Riley Wright IMPRESSIONS  1. Left ventricular ejection fraction, by estimation, is 60  to 65%. The left ventricle has normal function. The left ventricle has no regional wall motion abnormalities. There is mild left ventricular hypertrophy. Left ventricular diastolic parameters are consistent with Grade I diastolic dysfunction (impaired relaxation).  2. Right ventricular systolic function is normal. The right ventricular size is normal. Tricuspid regurgitation signal is inadequate for assessing PA pressure.  3. A small pericardial effusion is present. The pericardial effusion is circumferential.  4. The mitral valve is normal in structure. No evidence of mitral valve regurgitation. No evidence of mitral stenosis.  5. The aortic valve is tricuspid. Aortic valve regurgitation is not visualized. No aortic stenosis is present.  6. Agitated saline contrast bubble study was negative, with no evidence of any interatrial shunt. Comparison(s): No prior Echocardiogram. FINDINGS  Left Ventricle: Left  ventricular ejection fraction, by estimation, is 60 to 65%. The left ventricle has normal function. The left ventricle has no regional wall motion abnormalities. Strain was performed and the global longitudinal strain is indeterminate. The left ventricular internal cavity size was normal in size. There is mild left ventricular hypertrophy. Left ventricular diastolic parameters are consistent with Grade I diastolic dysfunction (impaired relaxation). Right Ventricle: The right ventricular size is normal. No increase in right ventricular wall thickness. Right ventricular systolic function is normal. Tricuspid regurgitation signal is inadequate for assessing PA pressure. Left Atrium: Left atrial size was normal in size. Right Atrium: Right atrial size was normal in size. Pericardium: A small pericardial effusion is present. The pericardial effusion is circumferential. Mitral Valve: The mitral valve is normal in structure. No evidence of mitral valve regurgitation. No evidence of mitral valve stenosis. Tricuspid Valve: The tricuspid valve is normal in structure. Tricuspid valve regurgitation is not demonstrated. No evidence of tricuspid stenosis. Aortic Valve: The aortic valve is tricuspid. Aortic valve regurgitation is not visualized. No aortic stenosis is present. Pulmonic Valve: The pulmonic valve was not well visualized. Pulmonic valve regurgitation is trivial. No evidence of pulmonic stenosis. Aorta: The aortic root is normal in size and structure. Venous: The inferior vena cava was not well visualized. IAS/Shunts: No atrial level shunt detected by color flow Doppler. Agitated saline contrast was given intravenously to evaluate for intracardiac shunting. Agitated saline contrast bubble study was negative, with no evidence of any interatrial shunt. Additional Comments: 3D was performed not requiring image post processing on an independent workstation and was indeterminate.  LEFT VENTRICLE PLAX 2D LVIDd:         3.80  cm   Diastology LVIDs:         2.40 cm   LV e' medial:    6.42 cm/s LV PW:         1.20 cm   LV E/e' medial:  10.9 LV IVS:        1.20 cm   LV e' lateral:   9.68 cm/s LVOT diam:     2.20 cm   LV E/e' lateral: 7.2 LV SV:         77 LV SV Index:   35 LVOT Area:     3.80 cm  RIGHT VENTRICLE             IVC RV S prime:     17.30 cm/s  IVC diam: 1.20 cm TAPSE (M-mode): 2.6 cm LEFT ATRIUM             Index        RIGHT ATRIUM           Index LA diam:  3.20 cm 1.45 cm/m   RA Area:     12.90 cm LA Vol (A2C):   30.7 ml 13.90 ml/m  RA Volume:   30.60 ml  13.86 ml/m LA Vol (A4C):   34.3 ml 15.53 ml/m LA Biplane Vol: 33.7 ml 15.26 ml/m  AORTIC VALVE LVOT Vmax:   110.00 cm/s LVOT Vmean:  70.400 cm/s LVOT VTI:    0.202 m  AORTA Ao Root diam: 2.90 cm Ao Asc diam:  3.10 cm MITRAL VALVE MV Area (PHT): 3.53 cm    SHUNTS MV Decel Time: 215 msec    Systemic VTI:  0.20 m MV E velocity: 69.80 cm/s  Systemic Diam: 2.20 cm MV A velocity: 80.90 cm/s MV E/A ratio:  0.86 Riley Wright Electronically signed by Diannah Late Wright Signature Date/Time: 02/15/2024/1:22:06 PM    Final    MR BRAIN W CONTRAST Result Date: 02/15/2024 EXAM: MR Brain with Intravenous Contrast. CLINICAL HISTORY: r/o brain mets. TECHNIQUE: Magnetic resonance images of the brain with intravenous contrast in multiple planes. CONTRAST: With; 9.4 mL gadobutrol (GADAVIST) 1 MMOL/ML injection. COMPARISON: MRI of the head dated 02/14/2024. FINDINGS: BRAIN: No restricted diffusion to indicate acute infarction. Numerous nodular and linear foci of contrast enhancement present within the cerebral cortex and subcortical white matter bilaterally, including on images 29, 31, 33, 34, 36, 37, 44, 45, 47, and 49 of series 4. There are no ring enhancing lesions. There are numerous areas of ischemia with no associated contrast enhancement. There is some enhancement along the periphery of the lacunar infarct within the left thalamus. No intracranial mass or  hemorrhage. No midline shift or extra-axial fluid collection. No cerebellar tonsillar ectopia. The central arterial and venous flow voids are patent. VENTRICLES: No hydrocephalus. ORBITS: The orbits are normal. SINUSES AND MASTOIDS: The sinuses and mastoid air cells are clear. BONES: No acute fracture or focal osseous lesion. IMPRESSION: 1. Numerous nodular and linear foci of contrast enhancement within the cerebral cortex and subcortical white matter bilaterally, without ring enhancement. It is not clear whether this represents luxury perfusion from ischemia. There are multiple areas of infarction that demonstrated no associated enhancement. Recommend follow up MRI of the brain without and with gadolinium contrast in approximately 1 week. 2. Numerous areas of ischemia without associated contrast enhancement. 3. Enhancement along the periphery of the lacunar infarct within the left thalamus. Electronically signed by: Evalene Coho MD 02/15/2024 10:42 AM EDT RP Workstation: [id number]   CT HEAD WO CONTRAST ( ) Result Date: 02/15/2024 EXAM: CT HEAD WITHOUT CONTRAST 02/15/2024 09:26:05 AM TECHNIQUE: CT of the head was performed without the administration of intravenous contrast. Automated exposure control, iterative reconstruction, and/or weight based adjustment of the mA/kV was utilized to reduce the radiation dose to as low as reasonably achievable. COMPARISON: 02/14/2024 CLINICAL HISTORY: Follow up Stroke, looking for any bleeding before starting Heparin. FINDINGS: BRAIN AND VENTRICLES: No acute hemorrhage. 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 No hydrocephalus. No extra-axial collection. No mass effect or midline shift. ORBITS: No acute abnormality. SINUSES: No acute abnormality. SOFT TISSUES AND SKULL: No acute soft tissue abnormality. No skull fracture. IMPRESSION: 1. 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 2.  Unchanged left cerebellar infarcts. Electronically signed by: Evalene Coho MD 02/15/2024 10:28 AM EDT RP Workstation: [id number]   CT CHEST ABDOMEN PELVIS W CONTRAST Result Date: 02/14/2024 CLINICAL DATA:  Concern for metastatic disease, multiple hepatic masses on ultrasound EXAM: CT CHEST, ABDOMEN, AND PELVIS WITH CONTRAST TECHNIQUE: Multidetector CT imaging  of the chest, abdomen and pelvis was performed following the standard protocol during bolus administration of intravenous contrast. RADIATION DOSE REDUCTION: This exam was performed according to the departmental dose-optimization program which includes automated exposure control, adjustment of the mA and/or kV according to patient size and/or use of iterative reconstruction technique. CONTRAST:  75mL OMNIPAQUE  IOHEXOL  350 MG/ML SOLN COMPARISON:  02/14/2024, 10/27/2019 FINDINGS: CT CHEST FINDINGS Cardiovascular: The heart is unremarkable without pericardial effusion. No evidence of thoracic aortic aneurysm or dissection. While not optimized for opacification of the pulmonary vasculature, filling defects are seen within the left upper and left lower lobe pulmonary arteries compatible with pulmonary emboli. Mediastinum/Nodes: No enlarged mediastinal, hilar, or axillary lymph nodes. Thyroid gland, trachea, and esophagus demonstrate no significant findings. Lungs/Pleura: Trace bilateral pleural effusions. There are innumerable bilateral subcentimeter pulmonary nodules concerning for metastatic disease given findings on previous ultrasound. Index nodule in the left upper lobe reference image 59/5 measures 5 mm. No pneumothorax. The central airways are patent. Musculoskeletal: There are no acute or destructive bony abnormalities. Prior healed left rib fractures. Reconstructed images demonstrate no additional findings. CT ABDOMEN PELVIS FINDINGS Hepatobiliary: Innumerable hypodense masses are seen throughout the liver compatible with metastatic disease. Index  lesion in the left lobe measures 4.6 x 4.8 cm reference image 42/3. Gallbladder is decompressed without evidence of cholelithiasis or cholecystitis. No biliary duct dilation. Pancreas: Ill-defined hypodense mass within the pancreatic body measuring 4.9 x 2.4 cm, highly concerning for pancreatic neoplasm. No acute inflammatory changes. Spleen: Wedge-shaped peripheral hypodensities are seen within the spleen, the largest of which persists on delayed imaging, which could reflect splenic infarcts. No evidence of splenomegaly. Adrenals/Urinary Tract: Multiple simple appearing bilateral renal cysts, largest in the upper pole right kidney measuring 8.1 cm. No specific imaging follow-up is recommended. No obstructive uropathy within either kidney. Excreted contrast from previous CT within the bladder lumen, with no evidence of filling defect. The adrenals are unremarkable. Stomach/Bowel: No bowel obstruction or ileus. Normal appendix right lower quadrant. No bowel wall thickening or inflammatory change. Vascular/Lymphatic: No discrete adenopathy within the abdomen or pelvis. No significant vascular findings. Reproductive: Prostate is enlarged, with hypertrophied median lobe resulting in mass effect upon the inferior wall the bladder. Other: No free fluid or free intraperitoneal gas. No abdominal wall hernia. Musculoskeletal: No acute or destructive bony abnormalities. Reconstructed images demonstrate no additional findings. IMPRESSION: 1. Incidental left-sided pulmonary emboli. No evidence of right heart strain. 2. 4.9 x 2.4 cm hypodense mass within the pancreatic body, highly concerning for pancreatic neoplasm. 3. Innumerable hypodense masses throughout the liver consistent with hepatic metastases. 4. Innumerable subcentimeter bilateral pulmonary nodules consistent with pulmonary metastases. Largest index nodule in the left upper lobe measures 5 mm. 5. Peripheral wedge-shaped hypodensities within the spleen, which could  reflect splenic infarcts. 6. Trace bilateral pleural effusions. 7. Enlarged prostate. Critical Value/emergent results were called by telephone at the time of interpretation on 02/14/2024 at 8:10 pm to provider [facility] , who verbally acknowledged these results. Electronically Signed   By: Ozell Wright M.D.   On: 02/14/2024 20:16   US  Abdomen Limited RUQ (LIVER/GB) Result Date: 02/14/2024 EXAM: Right Upper Quadrant Abdominal Ultrasound 02/14/2024 05:10:53 PM TECHNIQUE: Real-time ultrasonography of the right upper quadrant of the abdomen was performed. COMPARISON: None available. CLINICAL HISTORY: Elevated AST (SGOT) [id number]; [id number] Elevated ALT measurement [id number]. FINDINGS: LIVER: Multiple solid-appearing hepatic masses, the largest in the right hepatic lobe measuring up to 4.2 cm and the largest in the left hepatic lobe  measuring 3.6 cm. Findings concerning for metastases. No intrahepatic biliary ductal dilatation. BILIARY SYSTEM: Gallbladder not definitively visualized. Common bile duct is within normal limits measuring . RIGHT KIDNEY: The right kidney is grossly unremarkable in appearances without evidence of hydronephrosis, echogenic calculi or worrisome mass lesions. PANCREAS: Visualized portions of the pancreas are unremarkable. OTHER: No right upper quadrant ascites. IMPRESSION: 1. Multiple solid-appearing hepatic masses, largest 4.2 cm in the right lobe and 3.6 cm in the left lobe, concerning for metastases. . 2. Gallbladder not definitively visualized. Electronically signed by: Franky Crease MD 02/14/2024 05:27 PM EDT RP Workstation: [id number]   CT ANGIO HEAD NECK W WO CM Result Date: 02/14/2024 EXAM: CTA HEAD AND NECK WITH CONTRAST 02/14/2024 01:34:00 PM TECHNIQUE: CTA of the head and neck was performed with the administration of 75 mL of iohexol  (OMNIPAQUE ) 350 MG/ML injection. Multiplanar 2D and/or 3D reformatted images are provided for review. Automated exposure control, iterative  reconstruction, and/or weight based adjustment of the mA/kV was utilized to reduce the radiation dose to as low as reasonably achievable. Stenosis of the internal carotid arteries measured using NASCET criteria. COMPARISON: brain MRI 02/14/2024. CLINICAL HISTORY: Stroke/TIA, determine embolic source. FINDINGS: CTA NECK: AORTIC ARCH AND ARCH VESSELS: No dissection or arterial injury. No significant stenosis of the brachiocephalic or subclavian arteries. CERVICAL CAROTID ARTERIES: Minimal atherosclerotic calcification at the left carotid bifurcation. No hemodynamically significant stenosis of either carotid system by NASCET criteria. No dissection or arterial injury. CERVICAL VERTEBRAL ARTERIES: Vertebral arteries are right dominant. No dissection, arterial injury, or significant stenosis. LUNGS AND MEDIASTINUM: Unremarkable. SOFT TISSUES: No acute abnormality. BONES: No acute abnormality. CTA HEAD: ANTERIOR CIRCULATION: No significant stenosis of the internal carotid arteries. No significant stenosis of the anterior cerebral arteries. No significant stenosis of the middle cerebral arteries. No aneurysm. POSTERIOR CIRCULATION: No significant stenosis of the posterior cerebral arteries. No significant stenosis of the basilar artery. No significant stenosis of the vertebral arteries. No aneurysm. OTHER: No dural venous sinus thrombosis on this non-dedicated study. IMPRESSION: 1. No large vessel occlusion, hemodynamically significant stenosis, or aneurysm in the head or neck. 2. Minimal atherosclerotic calcification at the left carotid bifurcation without hemodynamically significant stenosis. 3. Essentially no atherosclerotic disease of the visualized craniocervical arteries consider cardiac source/arrhythmia as a potential source of embolism infarcts. Correlation with echocardiogram suggested. Electronically signed by: Franky Stanford MD 02/14/2024 02:42 PM EDT RP Workstation: [id number]   MR BRAIN WO CONTRAST Result  Date: 02/14/2024 EXAM: MRI BRAIN WITHOUT CONTRAST 02/14/2024 01:03:36 PM TECHNIQUE: Multiplanar multisequence MRI of the head/brain was performed without the administration of intravenous contrast. COMPARISON: None available. CLINICAL HISTORY: FINDINGS: BRAIN AND VENTRICLES: There are numerous foci of acute ischemia throughout both cerebral hemispheres and cerebellar hemispheres. The largest lesion is in the left thalamus. The burden of lesions is greater on the left than the right. Scattered chronic microhemorrhages in the peripheral cerebral hemispheres. Multifocal hyperintense T2-weighted signal within the cerebral white matter, most commonly due to chronic small vessel disease. No mass. No midline shift. No hydrocephalus. The sella is unremarkable. Normal flow voids. ORBITS: No acute abnormality. SINUSES AND MASTOIDS: No acute abnormality. BONES AND SOFT TISSUES: Normal marrow signal. No acute soft tissue abnormality. IMPRESSION: 1. Numerous acute ischemic foci throughout both cerebral and cerebellar hemispheres, with greater burden on the left. This is consistent with an embolic process from a cardiac or proximal aortic source. 2. Multifocal cerebral white matter T2 hyperintensities consistent with chronic small vessel disease. Electronically signed by: Franky Stanford MD 02/14/2024  02:38 PM EDT RP Workstation: [id number]   CT HEAD WO CONTRAST Result Date: 02/14/2024 EXAM: CT HEAD WITHOUT CONTRAST 02/14/2024 11:16:00 AM TECHNIQUE: CT of the head was performed without the administration of intravenous contrast. Automated exposure control, iterative reconstruction, and/or weight based adjustment of the mA/kV was utilized to reduce the radiation dose to as low as reasonably achievable. COMPARISON: None available. CLINICAL HISTORY: Memory loss. FINDINGS: BRAIN AND VENTRICLES: No acute hemorrhage. There is an ovoid area of diminished attenuation present anteriorly within the left thalamus concerning for subacute  ischemic infarct. There is also a focal area of diminished attenuation in the subcortical white matter of the left posterior temporal lobe seen on image 16 of series 3, which is concerning for subcortical infarct. There are also multiple areas of age indeterminate infarction within the cerebellar hemispheres bilaterally, worse on the left. There is no significant cerebral or cerebellar swelling. There is mild cerebral white matter disease. No hydrocephalus. No extra-axial collection. No mass effect or midline shift. ORBITS: No acute abnormality. SINUSES: No acute abnormality. SOFT TISSUES AND SKULL: No acute soft tissue abnormality. No skull fracture. IMPRESSION: 1. Ovoid area of diminished attenuation in the left thalamus, concerning for subacute ischemic infarct. 2. Focal area of diminished attenuation in the subcortical white matter of the left posterior temporal lobe, concerning for subcortical infarct. 3. Multiple areas of age indeterminate infarction within the cerebellar hemispheres bilaterally, worse on the left. 4. Correlation with MRI of the brain and CT angiogram of the head and neck is suggested. Electronically signed by: Evalene Coho MD 02/14/2024 11:29 AM EDT RP Workstation: [id number]    Medications: I have reviewed the patient's current medications.  Assessment/Plan: Pancreas mass, liver/lung lesions 02/14/2024 CTs: 4.9 x 2.4 cm hypodense mass in the pancreas body, innumerable hypodense liver metastases, innumerable subcentimeter bilateral pulmonary nodules, peripheral wedge-shaped hypodensities in the spleen 2.   Pulmonary embolism on CT chest 02/14/2024, no right heart strain 3.   Multivessel distribution CVAs 02/14/2024 CT head: Diminished attenuation in the left thalamus concerning for a subacute infarct, focal area of subcortical infarct in the left posterior temporal lobe, multiple areas of age-indeterminate infarction within cerebellar hemispheres-worse on the left 02/14/2024  MRI brain: Numerous acute ischemic foci in both cerebellar and cerebral hemispheres consistent with emboli, multifocal cerebral T2 hyperintensities consistent with chronic small vessel disease 02/15/2024 MRI brain with contrast: Numerous nodular and linear foci of contrast enhancement, numerous areas of ischemia with no contrast-enhancement 4.  Elevated liver enzymes and bilirubin secondary to #1 5.  Thrombocytopenia 6.  Altered mental status/partial aphasia secondary to #2   Mr. Shurley appears unchanged.  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  He has altered mental status secondary to multiple strokes versus brain metastases.  He was found to have a pulmonary embolism on the chest CT 02/14/2024.  Mr. Mozingo is confused and does not appear to have an understanding of his diagnosis.  He will not be a candidate for treatment of the cancer unless his mental status improves.  Recommendations: Continue supportive care, poststroke care per the medical and neurology services Heparin anticoagulation until he undergoes the liver biopsy procedure Radiation oncology consult to consider whole brain radiation if diffuse small brain metastases are favored-I will consult them after the liver biopsy and discussions with the patient/family regarding hospice care Ultrasound-guided biopsy of the liver lesion Please call oncology as needed, I will check on him 02/18/2024, I am available to meet with his family when they are available  LOS: 2 days   Riley Hof, MD   02/16/2024, 6:53 AM

## 2024-02-16 NOTE — Progress Notes (Signed)
 STROKE TEAM PROGRESS NOTE   INTERIM HISTORY/SUBJECTIVE His 2 sons are at the bedside. Patient remains confused and disoriented.  MRI with contrast shows multiple nodular foci of contrast-enhancement in the cortex and subcortical white matter unclear whether metastasis versus infarcts.     CBC    Component Value Date/Time   WBC 12.9 (H) 02/16/2024 0327   RBC 4.44 02/16/2024 0327   HGB 13.1 02/16/2024 0327   HCT 40.6 02/16/2024 0327   PLT 132 (L) 02/16/2024 0327   MCV 91.4 02/16/2024 0327   MCH 29.5 02/16/2024 0327   MCHC 32.3 02/16/2024 0327   RDW 12.9 02/16/2024 0327   LYMPHSABS 2.5 02/14/2024 1024   MONOABS 1.0 02/14/2024 1024   EOSABS 0.1 02/14/2024 1024   BASOSABS 0.1 02/14/2024 1024    BMET    Component Value Date/Time   NA 136 02/16/2024 0327   K 3.9 02/16/2024 0327   CL 98 02/16/2024 0327   CO2 25 02/16/2024 0327   GLUCOSE 90 02/16/2024 0327   BUN 12 02/16/2024 0327   CREATININE 1.01 02/16/2024 0327   CALCIUM 8.9 02/16/2024 0327   GFRNONAA >60 02/16/2024 0327    IMAGING past 24 hours VAS US  LOWER EXTREMITY VENOUS (DVT) Result Date: 02/16/2024  Lower Venous DVT Study Patient Name:  Riley Wright  Date of Exam:   02/15/2024 Medical Rec #: [id number]      Accession #:    [id number] Date of Birth: 06-22-1960      Patient Gender: M Patient Age:   63 years Exam Location:  [facility] Procedure:      VAS US  LOWER EXTREMITY VENOUS (DVT) Referring Phys: DEVON SHAFER --------------------------------------------------------------------------------  Indications: Embolic stroke vs. brain metasstases, left sided pulmonary embolism in the setting of new, incidental finding of large pancreatic mass concerning for neoplasm and several hepatic lesions concerning for metastases.  Comparison Study: No prior study Performing Technologist: [name] RVS  Examination Guidelines: A complete evaluation includes B-mode imaging, spectral Doppler, color Doppler, and power Doppler as  needed of all accessible portions of each vessel. Bilateral testing is considered an integral part of a complete examination. Limited examinations for reoccurring indications may be performed as noted. The reflux portion of the exam is performed with the patient in reverse Trendelenburg.  +---------+---------------+---------+-----------+----------+-------------------+ RIGHT    CompressibilityPhasicitySpontaneityPropertiesThrombus Aging      +---------+---------------+---------+-----------+----------+-------------------+ CFV      Full           Yes      Yes                                      +---------+---------------+---------+-----------+----------+-------------------+ SFJ      Full                                                             +---------+---------------+---------+-----------+----------+-------------------+ FV Prox  Full           Yes      No                                       +---------+---------------+---------+-----------+----------+-------------------+ FV Mid   Full  Yes      Yes                                      +---------+---------------+---------+-----------+----------+-------------------+ FV DistalFull                                                             +---------+---------------+---------+-----------+----------+-------------------+ PFV      Full                                                             +---------+---------------+---------+-----------+----------+-------------------+ POP      Full           Yes      Yes                  patent by color and                                                       Doppler             +---------+---------------+---------+-----------+----------+-------------------+ PTV      None                                         Acute               +---------+---------------+---------+-----------+----------+-------------------+ PERO     None                                          Acute               +---------+---------------+---------+-----------+----------+-------------------+   +---------+---------------+---------+-----------+----------+--------------+ LEFT     CompressibilityPhasicitySpontaneityPropertiesThrombus Aging +---------+---------------+---------+-----------+----------+--------------+ CFV      Full           Yes      Yes                                 +---------+---------------+---------+-----------+----------+--------------+ SFJ      Full                                                        +---------+---------------+---------+-----------+----------+--------------+ FV Prox  Full                                                        +---------+---------------+---------+-----------+----------+--------------+  FV Mid   Full           Yes      Yes                                 +---------+---------------+---------+-----------+----------+--------------+ FV DistalFull                                                        +---------+---------------+---------+-----------+----------+--------------+ PFV      Full                                                        +---------+---------------+---------+-----------+----------+--------------+ POP      Full           Yes      Yes                                 +---------+---------------+---------+-----------+----------+--------------+ PTV      None                                         Acute          +---------+---------------+---------+-----------+----------+--------------+ PERO     None                                         Acute          +---------+---------------+---------+-----------+----------+--------------+     Summary: RIGHT: - Findings consistent with acute deep vein thrombosis involving the right posterior tibial veins, and right peroneal veins.  - No cystic structure found in the popliteal fossa.  LEFT: - Findings consistent  with acute deep vein thrombosis involving the left posterior tibial veins, and left peroneal veins.  - No cystic structure found in the popliteal fossa.  *See table(s) above for measurements and observations. Electronically signed by Norman Serve on 02/16/2024 at 8:33:35 AM.    Final     Vitals:   02/15/24 2345 02/16/24 0341 02/16/24 0740 02/16/24 1141  BP: 127/86 128/86 128/82 130/81  Pulse: 68 68 72 (!) 58  Resp: 18 20 16 16   Temp: 99.9 F (37.7 C) 99.2 F (37.3 C) 98.6 F (37 C) 98.4 F (36.9 C)  TempSrc: Oral Oral Oral Oral  SpO2: 99% 98% 100% 100%  Weight:      Height:         PHYSICAL EXAM General:  Alert, well-nourished, well-developed patient in no acute distress Psych: Flat CV: Regular rate and rhythm on monitor Respiratory:  Regular, unlabored respirations on room air GI: Abdomen soft and nontender   NEURO:  Mental Status: Confused, aphasic, can follow simple 1 and occasional two-step commands.  Disoriented to time place and person.  Easy distractibility.  Poor attention span.  Poor insight into his condition.  Affect inappropriate for situation  Cranial Nerves:  II: PERRL. Visual fields full.  III, IV, VI: EOMI. Eyelids elevate symmetrically.  V: Sensation is intact to light touch and symmetrical to face.  VII: Right facial droop VIII: hearing intact to voice. IX, X: Palate elevates symmetrically. Phonation is normal.  KP:Dynloizm shrug 5/5. XII: tongue is midline without fasciculations. Motor: 5/5 strength in left arm and leg, right hand and arm weak with drift right leg with drift, decreased fine motor on right Tone: is normal and bulk is normal Sensation- Intact to light touch bilaterally. Extinction absent to light touch to DSS.   Coordination: FTN intact bilaterally, HKS: no ataxia in BLE.No drift.  Decreased fine motor skills on right, left over right orbiting Gait- deferred  Most Recent NIH 5   ASSESSMENT/PLAN  Mr. Riley Wright is a 63 y.o. male  with history of  back surgery. MRI concerning for embolic strokes.  Plan to admit to hospitalist service for stroke workup.  NIH on Admission 7  Acute Ischemic Infarct:  bilateral multiple  Etiology: Likely embolic due to hypercoagulable state due to new diagnosed cancer versus marantic endocarditis.  Doubt multiple brain metastasis CT head Ovoid area of diminished attenuation in the left thalamus, concerning for subacute ischemic infarct. 2. Focal area of diminished attenuation in the subcortical white matter of the left posterior temporal lobe, concerning for subcortical infarct. 3. Multiple areas of age indeterminate infarction within the cerebellar hemispheres bilaterally, worse on the left. CTA head & neck No LVO  MRI    Numerous acute ischemic foci throughout both cerebral and cerebellar hemispheres, with greater burden on the left. MRI brain w contrast numerous nodular and linear foci of contrast enhancement within the cerebral cortex and subcortical white matter bilaterally, without ring enhancement. It is not clear whether this represents luxury perfusion from ischemia or metastasis CT abd/ chest/pelvis 1. Incidental left-sided pulmonary emboli. No evidence of right heart strain.2. 4.9 x 2.4 cm hypodense mass within the pancreatic body, highly concerning for pancreatic neoplasm. 3. Innumerable hypodense masses throughout the liver consistent with hepatic metastases. 4. Innumerable subcentimeter bilateral pulmonary nodules consistent with pulmonary metastases. Largest index nodule in the left upper lobe measures 5 mm. 5. Peripheral wedge-shaped hypodensities within the spleen, which could reflect splenic infarcts. 6. Trace bilateral pleural effusions. 7. Enlarged prostate. LE US  ordered 2D Echo EF 60 to 65%.  Small pericardial effusion.  No right-to-left shunt.    Left atrial size normal. LDL 106 HgbA1c 5.1 VTE prophylaxis -Lovenox No antithrombotic prior to admission, now on heparin IV and  transition to DOAC when able Therapy recommendations:  Pending Disposition: Pending   Hypertension Home meds: None Stable Blood Pressure Goal: SBP less than 160   Hyperlipidemia Home meds: None LDL 106, goal < 70 Add atorvastatin 80 mg Continue statin at discharge  Dysphagia Patient has post-stroke dysphagia, SLP consulted    Diet   Diet NPO time specified Except for: Sips with Meds   Diet regular Room service appropriate? Yes; Fluid consistency: Thin   Advance diet as tolerated  Other Active Problems Metastatic cancer-likely primary pancreatic-oncology consulted PE-starting IV heparin per primary team  Hospital day # 2   3 patient presented with confusion and altered behaviors secondary to multiple bilateral embolic like infarcts possibly related to hypercoagulability from pancreatic cancer with liver metastasis.  He also has small pulmonary emboli DVT and multiple cerebral emboli raises possibility of paradoxical embolism.  Other possibilities include marantic endocarditis or multiple brain metastasis.  Recommend TCD bubble study for right-to-left shunt..  Patient will need anticoagulation  and recommend IV heparin drip for now since patient is likely going to need a biopsy to confirm his cancer.  May switch to warfarin or full dose Lovenox after the biopsy.  Patient's prognosis appears poor.  Recommend palliative care consult to discuss goals of care.  Long discussion with patient and son at the bedside and answered questions.  Discussed with patient and his 2 sons at the bedside and answered questions.   I personally spent a total of 35 minutes in the care of the patient today including getting/reviewing separately obtained history, performing a medically appropriate exam/evaluation, counseling and educating, placing orders, referring and communicating with other health care professionals, documenting clinical information in the EHR, independently interpreting results, and  coordinating care.        Eather Popp, MD Medical Director [facility] Pager: [phone] 02/16/2024 12:25 PM   To contact Stroke Continuity provider, please refer to [email or web address]. After hours, contact General Neurology

## 2024-02-16 NOTE — Progress Notes (Signed)
 PHARMACY - ANTICOAGULATION CONSULT NOTE  Pharmacy Consult for heparin  Indication: PE, splenic infarct  No Known Allergies  Patient Measurements: Height: 6' 2 (188 cm) Weight: 94.2 kg (207 lb 9.6 oz) IBW/kg (Calculated) : 82.2 HEPARIN DW (KG): 94.2  Vital Signs: Temp: 98.4 F (36.9 C) (11/02 1141) Temp Source: Oral (11/02 1141) BP: 130/81 (11/02 1141) Pulse Rate: 58 (11/02 1141)  Labs: Recent Labs    02/14/24 1024 02/14/24 1028 02/15/24 0507 02/15/24 2044 02/16/24 0327 02/16/24 1410  HGB 14.6 15.6 13.3  --  13.1  --   HCT 44.8 46.0 40.2  --  40.6  --   PLT 142*  --  118*  --  132*  --   APTT 29  --   --   --   --   --   LABPROT 16.5*  --  18.6*  --  16.6*  --   INR 1.3*  --  1.5*  --  1.3*  --   HEPARINUNFRC  --   --   --  0.12* 0.19* 0.24*  CREATININE 1.11 0.90 1.01  --  1.01  --     Estimated Creatinine Clearance: 88.2 mL/min (by C-G formula based on SCr of 1.01 mg/dL).   Medical History: Past Medical History:  Diagnosis Date   Arthritis    bil hips   Heart murmur     Medications:  No medications prior to admission.   Scheduled:   Assessment: 63 yo male with CT showing PE also also noted with CVA and metastatic cancer. Pharmacy consulted to dose heparin.  Heparin level 0.24 and below goal but trending up  Goal of Therapy:  Heparin level: 0.3-0.5 Monitor platelets by anticoagulation protocol: Yes   Plan:  -No heparin bolus given low goal range  -Increase heparin infusion to 1650 units/hr -Heparin level in 6 hrs  Prentice Poisson, PharmD Clinical Pharmacist **Pharmacist phone directory can now be found on [email or web address] ([email or web address]).  Listed under [facility].

## 2024-02-16 NOTE — Progress Notes (Signed)
 PT Cancellation Note  Patient Details Name: Riley Wright MRN: [id number] DOB: 1960/07/30   Cancelled Treatment:    Reason Eval/Treat Not Completed: Acute PT to hold evaluation per DO/RN request as pt was recently given haldol due to agitation. Acute PT to follow and re-attempt when appropriate.   Kate ORN, PT, DPT Secure Chat Preferred  Rehab Office [phone]    Kate BRAVO [name] 02/16/2024, 4:03 PM

## 2024-02-16 NOTE — Progress Notes (Addendum)
 Evaluated patient at bedside after RN notified team that patient was agitated and attempting to leave. At bedside, patient's brother and son are there. He is laying in bed continuously saying  I need to get out of here. We attempted to redirect the patient but he continues to voice wanting to leave. Fortunately, he was not attempting to leave while I was in the room. However, RN voiced concerns that he was physically trying to elope earlier.  Patient does not have capacity at this moment to leave AMA. He was unable to tell us  why he was here, per patient he is here for  an interview.  Patient also continuous to stat that we are keeping him here to send him to jail. We reassured that patient that he is here for treatment of his blood clots and to further evaluate his rehab needs as well as assess his metastatic cancer.  Again, patient was unable to demonstrate capacity at this time as he was not able to understand the risks of leaving AMA including worsening of PE, DVTs, as well as death.  Patient's son [name] was in the room who also attempted to redirect his father.  Plan: -Will continue to try to redirect -Will place prn haldol once order as patient is unable to be redirected at this moment and is at risk of eloping -IVC paperwork completed  -1:1 sitter order placed  Damien Lease, DO Internal Medicine Resident: PGY-2 Please contact the on call pager at: [phone]

## 2024-02-16 NOTE — Progress Notes (Signed)
 Subjective: Riley Wright is a 63 y.o. with a pertinent no known PMH, who presented with receptive and expressive aphasia and admitted for embolic stroke workup and found to have a pulmonary embolus and metastatic cancer, suspected primary source: pancrease.   No overnight events  Today, patient has not complaints. He does not recall the discussion of high suspicion of metastatic pancreatic cancer that we had yesterday.   Objective:  Vital signs in last 24 hours: Vitals:   02/15/24 1954 02/15/24 2345 02/16/24 0341 02/16/24 0740  BP: 123/84 127/86 128/86 128/82  Pulse: 74 68 68 72  Resp: 19 18 20 16   Temp: 98.8 F (37.1 C) 99.9 F (37.7 C) 99.2 F (37.3 C) 98.6 F (37 C)  TempSrc: Oral Oral Oral Oral  SpO2: 100% 99% 98% 100%  Weight:      Height:        Physical Exam: General:NAD, resting in bed comfortably  Pulmonary:normal effort on room air  Abdominal:soft, non-tender, hepatomegaly  Neuro:STABLE: awake, pupils are reactive to light and symmetrical bilaterally, 5/5 BL LE and UE motor strength intact, intermittently follows commands, responds in  short phrases including I am alright, does not speak in sentences nor does he have appropriate responses to questions, EOM intact MSK:No erythema, edema, or pain with deep palpation  Skin:warm and dry     Latest Ref Rng & Units 02/16/2024    3:27 AM 02/15/2024    5:07 AM 02/14/2024   10:28 AM  CBC  WBC 4.0 - 10.5 K/uL 12.9  13.5    Hemoglobin 13.0 - 17.0 g/dL 86.8  86.6  84.3   Hematocrit 39.0 - 52.0 % 40.6  40.2  46.0   Platelets 150 - 400 K/uL 132  118          Latest Ref Rng & Units 02/16/2024    3:27 AM 02/15/2024    5:07 AM 02/14/2024   10:28 AM  BMP  Glucose 70 - 99 mg/dL 90  99  884   BUN 8 - 23 mg/dL 12  11  15    Creatinine 0.61 - 1.24 mg/dL 8.98  8.98  9.09   Sodium 135 - 145 mmol/L 136  136  137   Potassium 3.5 - 5.1 mmol/L 3.9  3.7  3.8   Chloride 98 - 111 mmol/L 98  97  100   CO2 22 - 32 mmol/L 25  26     Calcium 8.9 - 10.3 mg/dL 8.9  8.6       Assessment/Plan:  Principal Problem:   Metastatic cancer (HCC) Active Problems:   Acute CVA (cerebrovascular accident) (HCC)   Elevated ALT measurement   Elevated AST (SGOT)   Leukocytosis   Liver masses   Mass of pancreas   Pulmonary embolus (HCC)   DVT (deep venous thrombosis) (HCC)   Elevated alkaline phosphatase level  #Metastatic cancer, suspected pancreatic as primary source #Elevated AST, ALT, Alkaline phosphatase  #Metastatic masses in the liver  Patient presented as a suspected acute stroke with elevated AST and ALT. RUQ US  demonstrated multiple solid appearing hepatic masses and a CT of the chest/ abd/pelvis showed 4.9 x 2.4 cm hypodense mass within the pancreatic body, highly concerning for pancreatic neoplasm with metastasis to the lung and liver. IR was consulted with plans to proceed with a biopsy of the liver mass on Friday for confirmation. Plan: -Liver biopsy Friday with IR -Appreciate oncology recommendations -Palliative care consulted: patient has a poor prognosis and has limited communication  2/2 aphasia  -Continue to monitor CMP, CBC, INR -Follow up CEA, AFP, CA 19-9 levels   #Metastatic lesions of the brain vs acute embolic CVAs  Patient initially thought to have embolic CVAs in the brain. Discussed case with neurology yesterday who favors metastatic brain lesions given the enhancement with contrast, over embolic strokes.  Plan: -D/c ASA and plavix  -Appreciate neurology recommendations  -Echocardiogram did not show evidence of PFO, follow up vas us  transcranial doppler with bubbles -TTE did not reveal valvular vegetations, may need TEE for concern for marantic endocarditis   #PE #DVT  Patient was found to have an incidental PE on chest CT and also found to have bilateral acute deep vein thrombosis involving the left and right posterior tibial veins, and left and right peroneal veins. High suspicion for trousseau  syndrome. He was started on a heparin drip yesterday. Patient remains HDS on RA.  Plan: -Continue heparin drip  -Will bridge off heparin drip once liver biopsy has been completed   #Leukocytosis:  Stable and likely reactive   Resolved Problems:  #AGMA __________________________________  Code Status: Full VTE Prophylaxis:Heparin drip Diet:Regular  IVF:N/A Barriers to Discharge:Evaluation of rehab needs, Liver biopsy  Dispo: Anticipated discharge in approximately 3-5 day(s).   Kandis Perkins, DO 02/16/2024, 8:24 AM Please contact the on call pager at: [phone]

## 2024-02-16 NOTE — Progress Notes (Signed)
 Called to bedside by nurse because Riley Wright was agitated and stating he was leaving the hospital. On my assessment he's sitting in a chair in the room in gown. Nurse reports he pulled out IV. He's cooperative, speech is normal sounding, no facial droop noted. Alert and oriented to hospital, stating he doesn't want to be hospitalized but would rather be at home. I explained that workup for his severe medical condition is still ongoing, that leaving would be unsafe. I note Riley Wright is under IVC right now. I spoke with his son who was present, who favors giving him medication to help his nerves. By the end of the encounter Riley Wright was back in bed, agreed to stay the night. I will order a one-time dose of olanzapine 7.5 mg to help with agitation and to promote bedrest overnight as I do worry about his risk of falling given his neurologic deficits and the fact that he has Foley catheter in place.  Ozell Kung MD 02/16/2024, 8:23 PM

## 2024-02-16 NOTE — ED Notes (Signed)
 IVC paperwork complete and at nurses desk, expires 02/23/24 @ 7:15pm, case # [id number]

## 2024-02-16 NOTE — Progress Notes (Signed)
 PHARMACY - ANTICOAGULATION CONSULT NOTE  Pharmacy Consult for heparin  Indication: PE, splenic infarct  No Known Allergies  Patient Measurements: Height: 6' 2 (188 cm) Weight: 94.2 kg (207 lb 9.6 oz) IBW/kg (Calculated) : 82.2 HEPARIN DW (KG): 94.2  Vital Signs: Temp: 99.2 F (37.3 C) (11/02 0341) Temp Source: Oral (11/02 0341) BP: 128/86 (11/02 0341) Pulse Rate: 68 (11/02 0341)  Labs: Recent Labs    02/14/24 1024 02/14/24 1028 02/15/24 0507 02/15/24 2044 02/16/24 0327  HGB 14.6 15.6 13.3  --  13.1  HCT 44.8 46.0 40.2  --  40.6  PLT 142*  --  118*  --  132*  APTT 29  --   --   --   --   LABPROT 16.5*  --  18.6*  --  16.6*  INR 1.3*  --  1.5*  --  1.3*  HEPARINUNFRC  --   --   --  0.12* 0.19*  CREATININE 1.11 0.90 1.01  --  1.01    Estimated Creatinine Clearance: 88.2 mL/min (by C-G formula based on SCr of 1.01 mg/dL).   Medical History: Past Medical History:  Diagnosis Date   Arthritis    bil hips   Heart murmur     Medications:  No medications prior to admission.   Scheduled:   Assessment: 63 yo male with CT showing PE also also noted with CVA and metastatic cancer. Pharmacy consulted to dose heparin.  Heparin level came back subtherapeutic at 0.12, on heparin infusion at 1100 units/hr. No s/sx of bleeding or infusion issues.   11/2 AM update:  Heparin level sub-therapeutic but trending up  Goal of Therapy:  Heparin level: 0.3-0.5 Monitor platelets by anticoagulation protocol: Yes   Plan:  -No heparin bolus given low goal range  -Increase heparin infusion to 1500 units/hr -Heparin level in 6-8 hrs  [name], PharmD, BCPS Clinical Pharmacist Phone: [phone]

## 2024-02-17 DIAGNOSIS — C787 Secondary malignant neoplasm of liver and intrahepatic bile duct: Secondary | ICD-10-CM | POA: Diagnosis not present

## 2024-02-17 DIAGNOSIS — Z7189 Other specified counseling: Secondary | ICD-10-CM | POA: Diagnosis not present

## 2024-02-17 DIAGNOSIS — I2699 Other pulmonary embolism without acute cor pulmonale: Secondary | ICD-10-CM | POA: Diagnosis not present

## 2024-02-17 DIAGNOSIS — G939 Disorder of brain, unspecified: Secondary | ICD-10-CM

## 2024-02-17 DIAGNOSIS — Z515 Encounter for palliative care: Secondary | ICD-10-CM | POA: Diagnosis not present

## 2024-02-17 DIAGNOSIS — I82443 Acute embolism and thrombosis of tibial vein, bilateral: Secondary | ICD-10-CM | POA: Diagnosis not present

## 2024-02-17 LAB — CBC
HCT: 39.7 % (ref 39.0–52.0)
Hemoglobin: 12.8 g/dL — ABNORMAL LOW (ref 13.0–17.0)
MCH: 29.2 pg (ref 26.0–34.0)
MCHC: 32.2 g/dL (ref 30.0–36.0)
MCV: 90.4 fL (ref 80.0–100.0)
Platelets: 156 K/uL (ref 150–400)
RBC: 4.39 MIL/uL (ref 4.22–5.81)
RDW: 12.9 % (ref 11.5–15.5)
WBC: 10.2 K/uL (ref 4.0–10.5)
nRBC: 0 % (ref 0.0–0.2)

## 2024-02-17 LAB — COMPREHENSIVE METABOLIC PANEL WITH GFR
ALT: 74 U/L — ABNORMAL HIGH (ref 0–44)
AST: 73 U/L — ABNORMAL HIGH (ref 15–41)
Albumin: 2.8 g/dL — ABNORMAL LOW (ref 3.5–5.0)
Alkaline Phosphatase: 172 U/L — ABNORMAL HIGH (ref 38–126)
Anion gap: 15 (ref 5–15)
BUN: 12 mg/dL (ref 8–23)
CO2: 24 mmol/L (ref 22–32)
Calcium: 8.5 mg/dL — ABNORMAL LOW (ref 8.9–10.3)
Chloride: 98 mmol/L (ref 98–111)
Creatinine, Ser: 1.03 mg/dL (ref 0.61–1.24)
GFR, Estimated: >60 mL/min (ref >60)
Glucose, Bld: 88 mg/dL (ref 70–99)
Potassium: 3.7 mmol/L (ref 3.5–5.1)
Sodium: 137 mmol/L (ref 135–145)
Total Bilirubin: 1.8 mg/dL — ABNORMAL HIGH (ref 0.0–1.2)
Total Protein: 5.6 g/dL — ABNORMAL LOW (ref 6.5–8.1)

## 2024-02-17 LAB — CANCER ANTIGEN 19-9: CA 19-9: 11 U/mL (ref 0–35)

## 2024-02-17 LAB — CEA: CEA: 182 ng/mL — ABNORMAL HIGH (ref 0.0–4.7)

## 2024-02-17 LAB — HEPARIN LEVEL (UNFRACTIONATED): Heparin Unfractionated: 0.31 [IU]/mL (ref 0.30–0.70)

## 2024-02-17 LAB — PROTIME-INR
INR: 1.2 (ref 0.8–1.2)
Prothrombin Time: 16.3 s — ABNORMAL HIGH (ref 11.4–15.2)

## 2024-02-17 LAB — AFP TUMOR MARKER: AFP, Serum, Tumor Marker: 2.1 ng/mL (ref 0.0–8.4)

## 2024-02-17 NOTE — Progress Notes (Signed)
 Physical Therapy Evaluation Patient Details Name: Riley Wright MRN: [id number] DOB: Dec 06, 1960 Today's Date: 02/17/2024  History of Present Illness  63 y.o. male presents to [facility] 02/14/24 with abnormal speech and cognition. MRI showed numerous acute ischemic foci in both cerebellar and cerebral hemispheres consistent with emboli. 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 Pt also with PE and B LE DVTs. No pertinent pmhx.  Clinical Impression  Pt received in supine and agreeable to PT session. PTA, pt states he was independent with all ADLs and drove himself as needed. Unsure if pt is reliable historian and no family present to confirm. Pt alert and oriented to person and DOB, but unaware of place, situation, and time. Today, pt supervision with bed mobility, requires minA to power up to stand, and was able to walk 102' with CGA and no AD. With gait, noted dec L stance time and L step length, but no LOB noted. B LE strength noted to be 5/5 in sitting. Recommending post-acute rehab >3hrs/day upon d/c to continue working on balance and cognition remediation. Acute PT to follow to address the above deficits.        If plan is discharge home, recommend the following: A little help with walking and/or transfers;Assistance with cooking/housework;Direct supervision/assist for medications management;Direct supervision/assist for financial management;Assist for transportation;Supervision due to cognitive status;A little help with bathing/dressing/bathroom   Can travel by private vehicle        Equipment Recommendations None recommended by PT  Recommendations for Other Services  Rehab consult    Functional Status Assessment Patient has had a recent decline in their functional status and demonstrates the ability to make significant improvements in function in a reasonable and predictable amount of time.     Precautions / Restrictions Precautions Precautions: Fall Recall of  Precautions/Restrictions: Impaired Restrictions Weight Bearing Restrictions Per Provider Order: No      Mobility  Bed Mobility Overal bed mobility: Needs Assistance Bed Mobility: Supine to Sit     Supine to sit: Supervision     General bed mobility comments: No physical assist needed for supine>sit. Required inc verbal cueing to carry out task.    Transfers Overall transfer level: Needs assistance Equipment used: None Transfers: Sit to/from Stand Sit to Stand: Min assist           General transfer comment: Required inc time and minA to boost into standing. No LOB noted.    Ambulation/Gait Ambulation/Gait assistance: Contact guard assist Gait Distance (Feet): 102 Feet Assistive device: None Gait Pattern/deviations: Decreased stance time - left, Decreased step length - left, Step-through pattern, Decreased step length - right Gait velocity: Dec Gait velocity interpretation: <1.8 ft/sec, indicate of risk for recurrent falls   General Gait Details: Noted quickened R step, with L stance time and step length dec. Pt with slightly flexed trunk. No LOB noted.  Stairs            Wheelchair Mobility     Tilt Bed    Modified Rankin (Stroke Patients Only) Modified Rankin (Stroke Patients Only) Pre-Morbid Rankin Score: No symptoms Modified Rankin: Moderately severe disability     Balance Overall balance assessment: Needs assistance Sitting-balance support: No upper extremity supported, Feet supported Sitting balance-Leahy Scale: Good     Standing balance support: No upper extremity supported, During functional activity Standing balance-Leahy Scale: Good  Pertinent Vitals/Pain Pain Assessment Pain Assessment: Faces Faces Pain Scale: No hurt    Home Living Family/patient expects to be discharged to:: Private residence Living Arrangements: Children Available Help at Discharge: Family;Available  PRN/intermittently Type of Home: Mobile home Home Access: Level entry       Home Layout: One level Home Equipment: Grab bars - toilet;Grab bars - tub/shower Additional Comments: Unsure if pt reliable historian, family not present to confirm.    Prior Function Prior Level of Function : Independent/Modified Independent;Driving;Working/employed;Patient poor historian/Family not available             Mobility Comments: States he does not use an AD at baseline. ADLs Comments: States he is independent with toileting, bathing, dressing, eating, cooking, driving PTA.     Extremity/Trunk Assessment   Upper Extremity Assessment Upper Extremity Assessment: Defer to OT evaluation;Overall WFL for tasks assessed    Lower Extremity Assessment Lower Extremity Assessment: Overall WFL for tasks assessed (5/5 LE MMT noted bilaterally.)    Cervical / Trunk Assessment Cervical / Trunk Assessment: Normal  Communication   Communication Communication: Impaired Factors Affecting Communication: Reduced clarity of speech    Cognition Arousal: Alert Behavior During Therapy: WFL for tasks assessed/performed   PT - Cognitive impairments: Orientation, Awareness, Memory, Attention, Problem solving, Safety/Judgement   Orientation impairments: Time, Situation, Place                   PT - Cognition Comments: Pt oriented to self and DOB, but stated nursing home when asked where he was, July when asked the date, and he was not sure why he is in the hospital. Following commands: Intact       Cueing Cueing Techniques: Verbal cues, Gestural cues     General Comments General comments (skin integrity, edema, etc.): Pt pleasantly confused, did not ask to leave/go home today.    Exercises     Assessment/Plan    PT Assessment Patient needs continued PT services  PT Problem List Decreased activity tolerance;Decreased mobility;Decreased balance;Decreased cognition;Decreased knowledge of  use of DME;Decreased safety awareness       PT Treatment Interventions Gait training;Functional mobility training;Therapeutic exercise;Therapeutic activities;Balance training;Neuromuscular re-education;Cognitive remediation;DME instruction;Patient/family education;Manual techniques    PT Goals (Current goals can be found in the Care Plan section)  Acute Rehab PT Goals Patient Stated Goal: to go home PT Goal Formulation: With patient Time For Goal Achievement: 03/02/24 Potential to Achieve Goals: Fair    Frequency Min 2X/week     Co-evaluation               AM-PAC PT 6 Clicks Mobility  Outcome Measure Help needed turning from your back to your side while in a flat bed without using bedrails?: A Little Help needed moving from lying on your back to sitting on the side of a flat bed without using bedrails?: A Little Help needed moving to and from a bed to a chair (including a wheelchair)?: A Little Help needed standing up from a chair using your arms (e.g., wheelchair or bedside chair)?: A Little Help needed to walk in hospital room?: A Little Help needed climbing 3-5 steps with a railing? : A Little 6 Click Score: 18    End of Session Equipment Utilized During Treatment: Gait belt Activity Tolerance: Patient tolerated treatment well Patient left: in chair;with nursing/sitter in room;with call bell/phone within reach Nurse Communication: Mobility status PT Visit Diagnosis: Unsteadiness on feet (R26.81);Difficulty in walking, not elsewhere classified (R26.2)    Time: [id number]  PT Time Calculation (min) (ACUTE ONLY): 20 min   Charges:   PT Evaluation $PT Eval Low Complexity: 1 Low   PT General Charges $$ ACUTE PT VISIT: 1 Visit         Alyha Marines, SPT   Temari Schooler 02/17/2024, 10:37 AM

## 2024-02-17 NOTE — Progress Notes (Signed)
 PHARMACY - ANTICOAGULATION CONSULT NOTE  Pharmacy Consult for heparin  Indication: PE, splenic infarct  No Known Allergies  Patient Measurements: Height: 6' 2 (188 cm) Weight: 94.2 kg (207 lb 9.6 oz) IBW/kg (Calculated) : 82.2 HEPARIN DW (KG): 94.2  Vital Signs: Temp: 98.5 F (36.9 C) (11/03 0434) Temp Source: Oral (11/03 0434) BP: 120/75 (11/03 0434) Pulse Rate: 66 (11/03 0434)  Labs: Recent Labs    02/14/24 1024 02/14/24 1028 02/15/24 0507 02/15/24 2044 02/16/24 0327 02/16/24 1410 02/16/24 2217 02/17/24 0227  HGB 14.6   < > 13.3  --  13.1  --   --  12.8*  HCT 44.8   < > 40.2  --  40.6  --   --  39.7  PLT 142*  --  118*  --  132*  --   --  156  APTT 29  --   --   --   --   --   --   --   LABPROT 16.5*  --  18.6*  --  16.6*  --   --  16.3*  INR 1.3*  --  1.5*  --  1.3*  --   --  1.2  HEPARINUNFRC  --   --   --    < > 0.19* 0.24* 0.25* 0.31  CREATININE 1.11   < > 1.01  --  1.01  --   --  1.03   < > = values in this interval not displayed.    Estimated Creatinine Clearance: 86.5 mL/min (by C-G formula based on SCr of 1.03 mg/dL).   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 No anticoagulation prior to admission. Pharmacy consulted for heparin.    Heparin level 0.31 is at low end of therapeutic on 1650 units/hr. CBC stable. Will increase given clot burden.   Goal of Therapy:  Heparin level: 0.3-0.5 Monitor platelets by anticoagulation protocol: Yes   Plan:  Increase heparin infusion to 1700 units/hr Monitor daily heparin level, CBC, signs/symptoms of bleeding    Jinnie Door, PharmD, BCPS, [facility] Clinical Pharmacist  Please check AMION for all [facility] phone numbers After 10:00 PM, call Main Pharmacy [phone]

## 2024-02-17 NOTE — Evaluation (Signed)
 Speech Language Pathology Evaluation Patient Details Name: Riley Wright MRN: [id number] DOB: 1961/01/22 Today's Date: 02/17/2024 Time: 8799-8762 SLP Time Calculation (min) (ACUTE ONLY): 37 min  Problem List:  Patient Active Problem List   Diagnosis Date Noted   DVT (deep venous thrombosis) (HCC) 02/16/2024   Metastatic cancer (HCC) 02/16/2024   Elevated alkaline phosphatase level 02/16/2024   Mass of pancreas 02/15/2024   Pulmonary embolus (HCC) 02/15/2024   Acute CVA (cerebrovascular accident) (HCC) 02/14/2024   Elevated ALT measurement 02/14/2024   Elevated AST (SGOT) 02/14/2024   Increased anion gap metabolic acidosis 02/14/2024   Leukocytosis 02/14/2024   Liver masses 02/14/2024   Spinal stenosis, lumbar region, with neurogenic claudication 01/26/2015   Past Medical History:  Past Medical History:  Diagnosis Date   Arthritis    bil hips   Heart murmur    Past Surgical History:  Past Surgical History:  Procedure Laterality Date   LUMBAR LAMINECTOMY/DECOMPRESSION MICRODISCECTOMY Right 01/26/2015   Procedure: LUMBAR DECOMPRESSION L4-5 CENTRAL AND TO THE RIGHT;  Surgeon: Tanda Heading, MD;  Location: [facility];  Service: Orthopedics;  Laterality: Right;   HPI:  63 y.o. male presents to [facility] 02/14/24 with abnormal speech and cognition. MRI showed numerous acute ischemic foci in both cerebellar and cerebral hemispheres consistent with emboli. 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 Pt also with PE and B LE DVTs. No pertinent pmhx.   Assessment / Plan / Recommendation Clinical Impression  Pt presents with significant recepetive and expressive aphasia. He comprehended 2/3 simple y/n questions, followed 2 step command with 50% accuracy and frequently did not comprehend questions  with perseveration on previous topics/questions. Initially he presents as less impaired as he is able to initiate mostly fluent speech with appropriate social greetings,  simple language/phrases but when requested to expound he has difficulty and frequently stated I don't know. Significant difficulty with naming: confrontational (25%), divergent and convergent naming (o%) with perseveration and one phonemic paraphasia noted. He benefited from phrase completion, phonemic and one semantic cue. Will assess more with reading and writing in diagnostic tx. Regarding cognition, pt had decreased awareness of deficits and overall impact on function. He did not remember OT working with him this morning.  Educated pt, son re: aphasia and cueing hiearchy and comprehension etc. Pt would benefit from intensive ST on inpatient rehb > 3 hours however physical needs are less. If is not CIR candidate pt is a safety risk and will need 24 hour supervision and discussed with pt and son. ST will work with while on acute.    SLP Assessment  SLP Recommendation/Assessment: Patient needs continued Speech Language Pathology Services SLP Visit Diagnosis: Aphasia (R47.01);Cognitive communication deficit (R41.841)     Assistance Recommended at Discharge  Frequent or constant Supervision/Assistance  Functional Status Assessment Patient has had a recent decline in their functional status and demonstrates the ability to make significant improvements in function in a reasonable and predictable amount of time.  Frequency and Duration min 2x/week  2 weeks      SLP Evaluation Cognition  Overall Cognitive Status: Impaired/Different from baseline Arousal/Alertness: Awake/alert Orientation Level:  (some difficulty may be d/t receptive aphasia, incorrect on place with choice of 3, oriented to city) Year:  (stated November) Month: January Attention: Sustained Sustained Attention: Appears intact Memory: Impaired Memory Impairment: Decreased short term memory (did not recall having tx with OT this am- did recall once cued) Awareness: Impaired Awareness Impairment: Emergent impairment (decr  awarensess of errors) Problem Solving:  (  not formally assessed) Safety/Judgment: Impaired       Comprehension  Auditory Comprehension Yes/No Questions: Impaired (66% simple) Commands: Impaired Two Step Basic Commands: 25-49% accurate (50% for one trial) Visual Recognition/Discrimination Discrimination: Not tested Reading Comprehension Reading Status:  (TBA)    Expression Expression Primary Mode of Expression: Verbal Verbal Expression Overall Verbal Expression: Appears within functional limits for tasks assessed Initiation: No impairment Level of Generative/Spontaneous Verbalization: Sentence;Phrase Repetition:  (TBA) Naming: Impairment Confrontation: Impaired (25%) Convergent: 0-24% accurate Divergent:  (1 animal, could not guess from clues) Verbal Errors: Perseveration;Phonemic paraphasias Written Expression Written Expression:  (TBA)   Oral / Motor  Oral Motor/Sensory Function Overall Oral Motor/Sensory Function: Mild impairment Facial ROM: Reduced right (minimal) Motor Speech Overall Motor Speech: Appears within functional limits for tasks assessed Respiration: Within functional limits Phonation: Normal Resonance: Within functional limits Articulation: Within functional limitis Intelligibility: Intelligible Motor Planning: Within functional limits Motor Speech Errors: Not applicable            Riley Wright 02/17/2024, 2:23 PM

## 2024-02-17 NOTE — Plan of Care (Signed)
  Problem: Education: Goal: Knowledge of disease or condition will improve Outcome: Progressing   Problem: Education: Goal: Knowledge of General Education information will improve Description: Including pain rating scale, medication(s)/side effects and non-pharmacologic comfort measures Outcome: Progressing   Problem: Safety: Goal: Ability to remain free from injury will improve Outcome: Progressing

## 2024-02-17 NOTE — Evaluation (Signed)
 Occupational Therapy Evaluation Patient Details Name: Riley Wright MRN: [id number] DOB: 09-02-1960 Today's Date: 02/17/2024   History of Present Illness   63 y.o. male presents to [facility] 02/14/24 with abnormal speech and cognition. MRI showed numerous acute ischemic foci in both cerebellar and cerebral hemispheres consistent with emboli. 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 Pt also with PE and B LE DVTs. No pertinent pmhx.     Clinical Impressions Prior to this admission, patient living alone, fully independent, and driving. Family not present to coroborate prior level. Currently, patient with cognitive impairments (see section below) and is min A for ADL management. Patient CGA for transfers. Given prior level and current cognitive needs, OT recommending rehab of greater intensity > 3 hours prior to discharge home. OT noting that if patient were to return home at this level, patient would require 24/7 supervision for safety and independence.    If plan is discharge home, recommend the following:   A little help with walking and/or transfers;A little help with bathing/dressing/bathroom;Assistance with cooking/housework;Assistance with feeding;Direct supervision/assist for medications management;Direct supervision/assist for financial management;Assist for transportation;Help with stairs or ramp for entrance;Supervision due to cognitive status     Functional Status Assessment   Patient has had a recent decline in their functional status and demonstrates the ability to make significant improvements in function in a reasonable and predictable amount of time.     Equipment Recommendations   None recommended by OT     Recommendations for Other Services         Precautions/Restrictions   Precautions Precautions: Fall Recall of Precautions/Restrictions: Impaired Restrictions Weight Bearing Restrictions Per Provider Order: No     Mobility Bed  Mobility Overal bed mobility: Needs Assistance             General bed mobility comments: up in recliner    Transfers Overall transfer level: Needs assistance Equipment used: None Transfers: Sit to/from Stand Sit to Stand: Contact guard assist           General transfer comment: for safety      Balance Overall balance assessment: Needs assistance Sitting-balance support: No upper extremity supported, Feet supported Sitting balance-Leahy Scale: Good     Standing balance support: No upper extremity supported, During functional activity Standing balance-Leahy Scale: Good                             ADL either performed or assessed with clinical judgement   ADL Overall ADL's : Needs assistance/impaired Eating/Feeding: Set up;Sitting   Grooming: Set up;Sitting   Upper Body Bathing: Contact guard assist;Sitting   Lower Body Bathing: Minimal assistance;Sit to/from stand;Sitting/lateral leans   Upper Body Dressing : Contact guard assist;Sitting   Lower Body Dressing: Minimal assistance;Sitting/lateral leans;Sit to/from stand   Toilet Transfer: Minimal assistance;Ambulation   Toileting- Clothing Manipulation and Hygiene: Contact guard assist;Sit to/from stand;Sitting/lateral lean       Functional mobility during ADLs: Minimal assistance;Cueing for safety;Cueing for sequencing General ADL Comments: Prior to this admission, patient living alone, fully independent, and driving. Family not present to coroborate prior level. Currently, patient with cognitive impairments (see section below) and is min A for ADL management. Patient CGA for transfers. Given prior level and current cognitive needs, OT recommending rehab of greater intensity > 3 hours prior to discharge home. OT  noting that if patient were to return home at this level, patient would require 24/7 supervision for safety  and independence.     Vision Baseline Vision/History: 0 No visual  deficits Ability to See in Adequate Light: 0 Adequate Patient Visual Report: No change from baseline Vision Assessment?: Yes Eye Alignment: Within Functional Limits Ocular Range of Motion: Within Functional Limits Alignment/Gaze Preference: Within Defined Limits Tracking/Visual Pursuits: Able to track stimulus in all quads without difficulty Saccades: Decreased speed of saccadic movement Convergence: Within functional limits Visual Fields: No apparent deficits     Perception Perception: Not tested       Praxis Praxis: Not tested       Pertinent Vitals/Pain Pain Assessment Pain Assessment: Faces Faces Pain Scale: No hurt     Extremity/Trunk Assessment Upper Extremity Assessment Upper Extremity Assessment: Right hand dominant;Overall [facility] for tasks assessed   Lower Extremity Assessment Lower Extremity Assessment: Defer to PT evaluation   Cervical / Trunk Assessment Cervical / Trunk Assessment: Normal   Communication Communication Communication: Impaired Factors Affecting Communication: Reduced clarity of speech   Cognition Arousal: Alert Behavior During Therapy: WFL for tasks assessed/performed Cognition: Cognition impaired   Orientation impairments: Place, Time, Situation Awareness: Intellectual awareness impaired, Online awareness impaired Memory impairment (select all impairments): Short-term memory, Working memory Attention impairment (select first level of impairment): Focused attention Executive functioning impairment (select all impairments): Initiation, Organization, Sequencing, Reasoning, Problem solving OT - Cognition Comments: Unable to recall date or place after it was given to him when delayed recall was assessed, could not name items on breakfast tray, increased time to answer all questions often staring off into the distance, would correct with cues                 Following commands: Impaired Following commands impaired: Follows one step commands  inconsistently, Follows multi-step commands inconsistently     Cueing  General Comments   Cueing Techniques: Verbal cues;Gestural cues  VSS on RA   Exercises     Shoulder Instructions      Home Living Family/patient expects to be discharged to:: Private residence Living Arrangements: Children Available Help at Discharge: Family;Available PRN/intermittently Type of Home: Mobile home Home Access: Level entry     Home Layout: One level     Bathroom Shower/Tub: Chief Strategy Officer: Standard Bathroom Accessibility: Yes   Home Equipment: Grab bars - toilet;Grab bars - tub/shower   Additional Comments: Unsure if pt reliable historian, family not present to confirm.  Lives With:  (step son)    Prior Functioning/Environment Prior Level of Function : Independent/Modified Independent;Driving;Working/employed;Patient poor historian/Family not available             Mobility Comments: States he does not use an AD at baseline. ADLs Comments: States he is independent with toileting, bathing, dressing, eating, cooking, driving PTA.    OT Problem List: Decreased cognition;Decreased safety awareness;Decreased knowledge of use of DME or AE;Decreased knowledge of precautions;Decreased coordination   OT Treatment/Interventions: Self-care/ADL training;Therapeutic exercise;Neuromuscular education;Energy conservation;DME and/or AE instruction;Therapeutic activities;Patient/family education;Balance training;Cognitive remediation/compensation;Manual therapy      OT Goals(Current goals can be found in the care plan section)   Acute Rehab OT Goals Patient Stated Goal: unable OT Goal Formulation: Patient unable to participate in goal setting Time For Goal Achievement: 03/02/24 Potential to Achieve Goals: Good ADL Goals Pt Will Perform Lower Body Bathing: with modified independence;sitting/lateral leans;sit to/from stand Pt Will Perform Lower Body Dressing: with modified  independence;sit to/from stand;sitting/lateral leans Pt Will Transfer to Toilet: with modified independence;ambulating;regular height toilet Pt Will Perform Toileting - Clothing Manipulation and hygiene:  with modified independence;sitting/lateral leans;sit to/from stand Additional ADL Goal #1: Patient will demonstrate increased online awareness without cues or assist as a precursor to upper level cognition. Additional ADL Goal #2: Patient will be able to follow 2 step commands as a precursor to upper level cognition.   OT Frequency:  Min 2X/week    Co-evaluation              AM-PAC OT 6 Clicks Daily Activity     Outcome Measure Help from another person eating meals?: A Little Help from another person taking care of personal grooming?: A Little Help from another person toileting, which includes using toliet, bedpan, or urinal?: A Little Help from another person bathing (including washing, rinsing, drying)?: A Little Help from another person to put on and taking off regular upper body clothing?: A Little Help from another person to put on and taking off regular lower body clothing?: A Little 6 Click Score: 18   End of Session Nurse Communication: Mobility status  Activity Tolerance: Patient tolerated treatment well Patient left: in chair;with call bell/phone within reach;with family/visitor present  OT Visit Diagnosis: Other abnormalities of gait and mobility (R26.89);Other symptoms and signs involving cognitive function;Cognitive communication deficit (R41.841) Symptoms and signs involving cognitive functions: Cerebral infarction                Time: 9063-9052 OT Time Calculation (min): 11 min Charges:  OT General Charges $OT Visit: 1 Visit OT Evaluation $OT Eval Moderate Complexity: 1 Mod  Ronal Gift E. Levy Wellman, OTR/L Acute Rehabilitation Services [phone]   Ronal Gift Salt 02/17/2024, 1:56 PM

## 2024-02-17 NOTE — Care Management Important Message (Signed)
 Important Message  Patient Details  Name: Riley Wright MRN: [id number] Date of Birth: Aug 16, 1960   Important Message Given:  Yes - Medicare IM     Claretta Deed 02/17/2024, 3:59 PM

## 2024-02-17 NOTE — TOC CAGE-AID Note (Signed)
 Transition of Care [facility]) - CAGE-AID Screening   Patient Details  Name: Riley Wright MRN: [id number] Date of Birth: 22-Jan-1961  Transition of Care [facility]) CM/SW Contact:    Lindora Alviar E Marque Bango, LCSW Phone Number: 02/17/2024, 12:51 PM   Clinical Narrative: Disoriented x 3.   CAGE-AID Screening: Substance Abuse Screening unable to be completed due to: : Patient unable to participate

## 2024-02-17 NOTE — Plan of Care (Signed)
  Problem: Education: Goal: Knowledge of disease or condition will improve Outcome: Progressing   Problem: Pain Managment: Goal: General experience of comfort will improve and/or be controlled Outcome: Progressing   Problem: Safety: Goal: Ability to remain free from injury will improve 02/17/2024 0612 by Dorrine Shasta SAUNDERS, RN Outcome: Progressing 02/17/2024 0302 by Dorrine Shasta SAUNDERS, RN Outcome: Progressing

## 2024-02-17 NOTE — Progress Notes (Signed)
 PHARMACY - PHYSICIAN COMMUNICATION CRITICAL VALUE ALERT - BLOOD CULTURE IDENTIFICATION (BCID)  Riley Wright is an 64 y.o. male who presented to [facility] on 02/14/2024   10/31 Blood>>1/4 gram + rods  Name of physician (or Provider) Contacted: Dr. Bernadine   Current antibiotics: None  Changes to prescribed antibiotics recommended:  Cont off anti-biotics   [name], PharmD, BCPS Clinical Pharmacist Phone: [phone]

## 2024-02-17 NOTE — Progress Notes (Signed)
 Inpatient Rehab Admissions Coordinator:   Per therapy recommendations pt was screened for CIR by Reche Lowers, PT, DPT.  Note admitted with aphasia, workup revealed CVAs likely due to metastatic cancer, presumed pancreatic.  Plan for biopsy Friday 11/7 with IR.  With therapy today he was supervision to CGA without an AD and able to ambulate ~100'.  At this time I do not think his insurance would approve CIR as he is already mobilizing very well so I will not place a consult, however I do note that workup is ongoing and he is at risk for debilitation from prolonged hospitalization so we will continue to monitor his therapy progress and medical workup from a distance through chart review.  Please contact me with questions.   Reche Lowers, PT, DPT Admissions Coordinator [phone] 02/17/24 12:09 PM

## 2024-02-17 NOTE — Progress Notes (Addendum)
 Subjective: Riley Wright is a 63 y.o. 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   Yesterday afternoon, pt was placed under IVC for attempting to leave the hospital which would have put the patient at a danger to his self to do so. He was given Olanzapine 7.5mg  for agitation, and he slept well through the night.   Today, patient denies SOB, fever, chest pain, and endorses a good night of sleep. He is more alert and responsive to questions compared to previous days. Some expressive aphasia is still present. Pt is still confused. We reiterated conversation about medical workup, which pt said they were not aware of, though it has been documented in past notes.  Objective:  Vital signs in last 24 hours: Vitals:   02/16/24 1931 02/16/24 2310 02/17/24 0434 02/17/24 0828  BP: 136/87 126/79 120/75 122/71  Pulse: 75 71 66 (!) 59  Resp: 18 18 18 18   Temp: 98.8 F (37.1 C) 99.4 F (37.4 C) 98.5 F (36.9 C) 98 F (36.7 C)  TempSrc: Oral Oral Oral   SpO2: 99% 99% 97% 98%  Weight:      Height:        Physical Exam Vitals and nursing note reviewed. Exam conducted with a chaperone present.  Constitutional:      General: He is not in acute distress.    Appearance: He is not toxic-appearing.  Cardiovascular:     Rate and Rhythm: Normal rate and regular rhythm.     Pulses: Normal pulses.     Heart sounds: No murmur heard. Pulmonary:     Effort: Pulmonary effort is normal. No respiratory distress.     Breath sounds: Normal breath sounds.  Musculoskeletal:     Right lower leg: No edema.     Left lower leg: No edema.  Neurological:     Mental Status: He is alert.     Motor: Motor function is intact.   Optic Aphasia: Unable to name TV or Clock. Expressive Aphasia: Conversationally improved      Latest Ref Rng &  Units 02/17/2024    2:27 AM 02/16/2024    3:27 AM 02/15/2024    5:07 AM  CBC  WBC 4.0 - 10.5 K/uL 10.2  12.9  13.5   Hemoglobin 13.0 - 17.0 g/dL 87.1  86.8  86.6   Hematocrit 39.0 - 52.0 % 39.7  40.6  40.2   Platelets 150 - 400 K/uL 156  132  118         Latest Ref Rng & Units 02/17/2024    2:27 AM 02/16/2024    3:27 AM 02/15/2024    5:07 AM  BMP  Glucose 70 - 99 mg/dL 88  90  99   BUN 8 - 23 mg/dL 12  12  11    Creatinine 0.61 - 1.24 mg/dL 8.96  8.98  8.98   Sodium 135 - 145 mmol/L 137  136  136   Potassium 3.5 - 5.1 mmol/L 3.7  3.9  3.7   Chloride 98 - 111 mmol/L 98  98  97   CO2 22 - 32 mmol/L 24  25  26    Calcium 8.9 - 10.3 mg/dL 8.5  8.9  8.6    Assessment/Plan:  Principal Problem:   Metastatic cancer (HCC) Active Problems:   Acute CVA (cerebrovascular accident) (HCC)  Elevated ALT measurement   Elevated AST (SGOT)   Leukocytosis   Liver masses   Mass of pancreas   Pulmonary embolus (HCC)   DVT (deep venous thrombosis) (HCC)   Elevated alkaline phosphatase level  #Metastatic cancer, suspected pancreatic as primary source #Elevated AST, ALT, Alkaline phosphatase  #Metastatic masses in the liver  Patient presented as a suspected acute stroke with elevated AST and ALT. RUQ US  demonstrated multiple solid appearing hepatic masses and a CT of the chest/ abd/pelvis showed 4.9 x 2.4 cm hypodense mass within the pancreatic body, highly concerning for pancreatic neoplasm with metastasis to the lung and liver. IR was consulted with plans to proceed with a biopsy of the liver mass on Friday for confirmation. Plan: -Liver biopsy 11/7 with IR -Continue to monitor CMP, CBC, INR -Follow up CEA, AFP, CA 19-9 levels -Appreciate oncology recommendations -Palliative care consulted: patient has a poor prognosis and has limited communication 2/2 aphasia    #Metastatic lesions of the brain vs acute embolic CVAs  Patient initially thought to have embolic CVAs in the brain. Discussed case  with neurology yesterday who favors metastatic brain lesions given the enhancement with contrast, over embolic strokes.  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 -Appreciate neurology recommendations  -D/cd ASA and plavix    #PE #DVT  Patient was found to have an incidental PE on chest CT and also found to have bilateral acute deep vein thrombosis involving the left and right posterior tibial veins, and left and right peroneal veins. High suspicion for trousseau syndrome. He was started on a heparin drip yesterday. Patient remains HDS on RA.  Plan: -Continue heparin drip -Will bridge off heparin drip once liver biopsy has been completed  #Leukocytosis:  Stable and likely reactive   Resolved Problems:  #AGMA __________________________________  Code Status: Full VTE Prophylaxis:Heparin drip Diet:Regular  IVF:N/A Barriers to Discharge:Evaluation of rehab needs, Liver biopsy  Dispo: Anticipated discharge in approximately 3-5 day(s).   Riley Nancyann NOVAK, DO 02/17/2024, 10:05 AM Please contact the on call pager at: [phone]

## 2024-02-17 NOTE — TOC CM/SW Note (Signed)
 Transition of Care [facility]) - Inpatient Brief Assessment   Patient Details  Name: TYMAR POLYAK MRN: [id number] Date of Birth: Nov 29, 1960  [facility] CM/SW Contact:    Almarie CHRISTELLA Goodie, LCSW Phone Number: 02/17/2024, 1:23 PM   Clinical Narrative:     Patient from home with son, previously independent, admitted with acute CVA and metastatic cancer, workup ongoing. Noting pending liver biopsy for Friday, oncology plan and palliative consult pending for goals of care. CSW to follow for medical workup and assistance with disposition once appropriate.     Transition of Care Asessment: Insurance and Status: Insurance coverage has been reviewed Patient has primary care physician: No Home environment has been reviewed: Home with son Prior level of function:: Independent Prior/Current Home Services: No current home services Social Drivers of Health Review: SDOH reviewed no interventions necessary Readmission risk has been reviewed: Yes Transition of care needs: transition of care needs identified, TOC will continue to follow

## 2024-02-17 NOTE — Consult Note (Signed)
 Consultation Note Date: 02/17/2024   Patient Name: Riley Wright  DOB: 12-31-1960  MRN: [id number]  Age / Sex: 63 y.o., male  PCP: Patient, No Pcp Per Referring Physician: Eben Reyes BROCKS, MD  Reason for Consultation: Establishing goals of care  HPI/Patient Profile: 63 y.o. male  with past medical history of lumbar decompression admitted on 02/14/2024 with altered speech and behavior and poor appetite. 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 Also with concern for suspected pancreatic cancer with mets to liver.   Clinical Assessment and Goals of Care: Consult received and chart review completed including attending notes and consultant notes. I discussed with RN. I met today at Riley Wright bedside. He is awake and interactive. Appropriate in conversation but poor recall with no recollection of his current diagnosis or circumstances. He does tell me he is in a nursing home and then self corrects that this isn't right but unable to come up with hospital.  I returned to discuss with son, Riley Wright. We spoke outside the room. Riley Wright lives with Riley Wright. He shares about his father sudden confusion and decline. He has been processing his father's diagnosis and the information they have received while hospitalized. Riley Wright and I review his father's diagnosis and concern for poor prognosis. We did discuss the importance of considering quality of life and we did discuss the importance of considering code status options. Riley Wright is accepting of conversation and plans to discuss with his brother. Riley Wright expresses concerns for how to care for his father. We did discuss anticipation that he will require 24/7 care. We discussed limited resources for in home care. We discussed potential of facility care and ultimately Medicaid is pursued to assist with cost of facility care. Riley Wright is interested in  assistance to pursue Medicaid (discussed with CSW who will help connect with financial counselor as well). Riley Wright also shares that he is unsure how to manage his father's retirement payments and finances. We discussed that he or his brother will likely need to pursue guardianship through the court to oversee his father's care and finances. Riley Wright and I spend more time reviewing self care and processing his father's sudden change and diagnosis.   Update: I returned to bedside and met with both sons Riley Wright and Riley Wright along with Riley Wright's mother, Riley Wright, who is a good support. We had a long conversation reviewing medical situation and expectations, poor prognosis, and my conversation with Riley Wright earlier today. Riley Wright has good understanding of his father's diagnosis and expectations. We spent more time discussing the importance of quality of life and considering Riley Wright wishes. We spent more time discussing code status and Riley Wright reports inclination towards DNR/DNI but Riley Wright needs more time. Riley Wright does indicate that he has discussed this with his father and knows what he would want. Riley Wright was extremely support of both sons. She offered good advice to assist and prepare them for what is to come. I encouraged them to continue conversations amongst themselves and I will continue to follow as well.  All questions/concerns addressed. Emotional support provided. Updated attending team. Discussed with CSW.   Primary Decision Maker NEXT OF KIN 2 sons    SUMMARY OF RECOMMENDATIONS   - Family having ongoing conversations  - Strongly encouraged consideration of DNR  - Will need placement (likely SNF rehab) - Not eligible for hospice facility at this time even if comfort care elected - Time for outcomes - I will continue support and conversations  Code Status/Advance Care Planning: Full code   Symptom Management:  Per attending  Prognosis:  Prognosis poor with concern for new advanced  cancer  Discharge Planning: To Be Determined      Primary Diagnoses: Present on Admission:  DVT (deep venous thrombosis) (HCC)  Acute CVA (cerebrovascular accident) (HCC)   I have reviewed the medical record, interviewed the patient and family, and examined the patient. The following aspects are pertinent.  Past Medical History:  Diagnosis Date   Arthritis    bil hips   Heart murmur    Social History   Socioeconomic History   Marital status: Married    Spouse name: Not on file   Number of children: Not on file   Years of education: Not on file   Highest education level: Not on file  Occupational History   Not on file  Tobacco Use   Smoking status: Former    Current packs/day: 0.00    Types: Cigarettes    Quit date: 04/16/2016    Years since quitting: 7.8   Smokeless tobacco: Never  Substance and Sexual Activity   Alcohol use: Yes    Alcohol/week: 2.0 standard drinks of alcohol    Types: 2 Standard drinks or equivalent per week    Comment: a couple beers a night   Drug use: No   Sexual activity: Not on file  Other Topics Concern   Not on file  Social History Narrative   Not on file   Social Drivers of Health   Financial Resource Strain: Not on file  Food Insecurity: Low Risk  (09/17/2022)   Received from [facility]   Hunger Vital Sign    Within the past 12 months, you worried that your food would run out before you got money to buy more: Never true    Within the past 12 months, the food you bought just didn't last and you didn't have money to get more. : Never true  Transportation Needs: No Transportation Needs (09/17/2022)   Received from [facility]    In the past 12 months, has lack of reliable transportation kept you from medical appointments, meetings, work or from getting things needed for daily living? : No  Physical Activity: Not on file  Stress: Not on file  Social Connections: Not on file   Family History  Problem Relation Age  of Onset   Stroke Mother    Hypertension Mother    Hypertension Father    Scheduled Meds: Continuous Infusions:  heparin 1,700 Units/hr (02/17/24 0839)   PRN Meds:. No Known Allergies Review of Systems  Unable to perform ROS: Other  Constitutional:        Expressive aphasia    Physical Exam Vitals and nursing note reviewed.  Constitutional:      General: He is not in acute distress.    Appearance: Normal appearance.  Cardiovascular:     Rate and Rhythm: Normal rate.  Pulmonary:     Effort: No tachypnea, accessory muscle usage or respiratory distress.  Abdominal:  Palpations: Abdomen is soft.  Neurological:     Mental Status: He is confused.     Vital Signs: BP 122/71 (BP Location: Left Arm)   Pulse (!) 59   Temp 98 F (36.7 C)   Resp 18   Ht 6' 2 (1.88 m)   Wt 94.2 kg   SpO2 98%   BMI 26.65 kg/m  Pain Scale: 0-10   Pain Score: 0-No pain   SpO2: SpO2: 98 % O2 Device:SpO2: 98 % O2 Flow Rate: .   IO: Intake/output summary:  Intake/Output Summary (Last 24 hours) at 02/17/2024 1057 Last data filed at 02/17/2024 9160 Gross per 24 hour  Intake 480 ml  Output 1000 ml  Net -520 ml    LBM: Last BM Date : 02/14/24 Baseline Weight: Weight: 94.2 kg Most recent weight: Weight: 94.2 kg     Palliative Assessment/Data:    Time Total: 100 min  Greater than 50%  of this time was spent counseling and coordinating care related to the above assessment and plan.  Signed by: Bernarda Kitty, NP Palliative Medicine Team Pager # [phone] (M-F 8a-5p) Team Phone # [phone] (Nights/Weekends)

## 2024-02-18 ENCOUNTER — Telehealth (HOSPITAL_COMMUNITY): Payer: Self-pay | Admitting: Pharmacy Technician

## 2024-02-18 ENCOUNTER — Inpatient Hospital Stay (HOSPITAL_COMMUNITY)

## 2024-02-18 ENCOUNTER — Other Ambulatory Visit (HOSPITAL_COMMUNITY): Payer: Self-pay

## 2024-02-18 DIAGNOSIS — G9389 Other specified disorders of brain: Secondary | ICD-10-CM

## 2024-02-18 DIAGNOSIS — R569 Unspecified convulsions: Secondary | ICD-10-CM | POA: Diagnosis not present

## 2024-02-18 DIAGNOSIS — I824Z3 Acute embolism and thrombosis of unspecified deep veins of distal lower extremity, bilateral: Secondary | ICD-10-CM | POA: Diagnosis not present

## 2024-02-18 DIAGNOSIS — K8689 Other specified diseases of pancreas: Secondary | ICD-10-CM | POA: Diagnosis not present

## 2024-02-18 DIAGNOSIS — G8384 Todd's paralysis (postepileptic): Secondary | ICD-10-CM | POA: Diagnosis not present

## 2024-02-18 DIAGNOSIS — I639 Cerebral infarction, unspecified: Secondary | ICD-10-CM | POA: Diagnosis not present

## 2024-02-18 DIAGNOSIS — R918 Other nonspecific abnormal finding of lung field: Secondary | ICD-10-CM

## 2024-02-18 DIAGNOSIS — R160 Hepatomegaly, not elsewhere classified: Secondary | ICD-10-CM | POA: Diagnosis not present

## 2024-02-18 DIAGNOSIS — R4182 Altered mental status, unspecified: Secondary | ICD-10-CM | POA: Insufficient documentation

## 2024-02-18 DIAGNOSIS — C787 Secondary malignant neoplasm of liver and intrahepatic bile duct: Secondary | ICD-10-CM | POA: Diagnosis not present

## 2024-02-18 LAB — HEPARIN LEVEL (UNFRACTIONATED): Heparin Unfractionated: 0.36 [IU]/mL (ref 0.30–0.70)

## 2024-02-18 LAB — CBC
HCT: 38.8 % — ABNORMAL LOW (ref 39.0–52.0)
Hemoglobin: 12.4 g/dL — ABNORMAL LOW (ref 13.0–17.0)
MCH: 29.5 pg (ref 26.0–34.0)
MCHC: 32 g/dL (ref 30.0–36.0)
MCV: 92.2 fL (ref 80.0–100.0)
Platelets: 195 K/uL (ref 150–400)
RBC: 4.21 MIL/uL — ABNORMAL LOW (ref 4.22–5.81)
RDW: 13.2 % (ref 11.5–15.5)
WBC: 9.1 K/uL (ref 4.0–10.5)
nRBC: 0 % (ref 0.0–0.2)

## 2024-02-18 LAB — GLUCOSE, CAPILLARY: Glucose-Capillary: 155 mg/dL — ABNORMAL HIGH (ref 70–99)

## 2024-02-18 MED ORDER — HEPARIN (PORCINE) 25000 UT/250ML-% IV SOLN
1800.0000 [IU]/h | INTRAVENOUS | Status: AC
  Administered 2024-02-18 – 2024-02-19 (×3): 1750 [IU]/h via INTRAVENOUS
  Administered 2024-02-20: 1800 [IU]/h via INTRAVENOUS
  Filled 2024-02-18 (×4): qty 250

## 2024-02-18 MED ORDER — ADULT MULTIVITAMIN W/MINERALS CH
1.0000 | ORAL_TABLET | Freq: Every day | ORAL | Status: DC
  Administered 2024-02-18 – 2024-02-23 (×6): 1 via ORAL
  Filled 2024-02-18 (×6): qty 1

## 2024-02-18 MED ORDER — ENSURE PLUS HIGH PROTEIN PO LIQD
237.0000 mL | Freq: Two times a day (BID) | ORAL | Status: DC
  Administered 2024-02-18 – 2024-02-23 (×9): 237 mL via ORAL

## 2024-02-18 NOTE — Plan of Care (Signed)
 progressing

## 2024-02-18 NOTE — Progress Notes (Addendum)
 PHARMACY - ANTICOAGULATION CONSULT NOTE  Pharmacy Consult for heparin  Indication: PE, splenic infarct  No Known Allergies  Patient Measurements: Height: 6' 2 (188 cm) Weight: 94.2 kg (207 lb 9.6 oz) IBW/kg (Calculated) : 82.2 HEPARIN DW (KG): 94.2  Vital Signs: Temp: 97.8 F (36.6 C) (11/04 0611) Temp Source: Oral (11/04 0611) BP: 106/64 (11/04 0611) Pulse Rate: 70 (11/04 0611)  Labs: Recent Labs    02/16/24 0327 02/16/24 1410 02/16/24 2217 02/17/24 0227 02/18/24 0259  HGB 13.1  --   --  12.8* 12.4*  HCT 40.6  --   --  39.7 38.8*  PLT 132*  --   --  156 195  LABPROT 16.6*  --   --  16.3*  --   INR 1.3*  --   --  1.2  --   HEPARINUNFRC 0.19*   < > 0.25* 0.31 0.36  CREATININE 1.01  --   --  1.03  --    < > = values in this interval not displayed.    Estimated Creatinine Clearance: 86.5 mL/min (by C-G formula based on SCr of 1.03 mg/dL).   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 No anticoagulation prior to admission. Pharmacy consulted for heparin.    Heparin level 0.36 is at lower end of therapeutic on 1700 units/hr. Hgb, PLT WNL. Will increase given high clot burden.  Discussed with IM team about switch to Lovenox. Given brain lesions vs concern for acute embolic infarcts, will wait to switch pending neurology further eval. Lovenox only has one treatment dose with no lower goals for stroke.   Goal of Therapy:  Heparin level: 0.3-0.5 Monitor platelets by anticoagulation protocol: Yes   Plan:  Increase heparin infusion to 1750 units/hr Monitor daily heparin level, CBC, signs/symptoms of bleeding  F/u neuro eval to determine if switch to Lovenox  Izetta Carl, PharmD PGY1 Pharmacy Resident  Addendum 02/18/2024 9:45 AM Patient unresponsive to multiple  stimulations. Concerned for ischemic conversion to hemorrhagic stroke. Neuro was called and stat head CT being performed. Will stop heparin and follow-up neuro evaluation.   Izetta Carl, PharmD PGY1 Pharmacy Resident

## 2024-02-18 NOTE — Progress Notes (Signed)
 PT Cancellation Note  Patient Details Name: Riley Wright MRN: [id number] DOB: 18-May-1960   Cancelled Treatment:    Reason Eval/Treat Not Completed: Patient at procedure or test/unavailable (stat EEG in room)  Riley Wright, PT, DPT Acute Rehabilitation Services Office [phone]    Riley Wright 02/18/2024, 11:40 AM

## 2024-02-18 NOTE — Progress Notes (Signed)
 NEUROLOGY CONSULT FOLLOW UP NOTE   Date of service: February 18, 2024 Patient Name: Riley Wright MRN:  [id number] DOB:  1961/02/05  Interval Hx/subjective  Code stroke was called on patient today as he was found to be unresponsive to stimuli with pinpoint pupils.  He had not received any opioids.  On neurology arrival, he would respond to sternal rub, localizing with his left hand but not moving his right hand and was noted to have a right facial droop. Vitals   Vitals:   02/17/24 2003 02/18/24 0008 02/18/24 0611 02/18/24 0951  BP: 117/77 132/78 106/64 107/71  Pulse: 73 76 70 62  Resp: 17 16 15 18   Temp: 98.4 F (36.9 C) 99.2 F (37.3 C) 97.8 F (36.6 C) 99.1 F (37.3 C)  TempSrc: Oral Oral Oral Oral  SpO2: 96% 98% 100% 99%  Weight:      Height:         Body mass index is 26.65 kg/m.  Physical Exam   Constitutional: Appears well-developed and well-nourished.  Psych: Affect appropriate to situation.  Eyes: No scleral injection.  HENT: No OP obstrucion.  Head: Normocephalic.  Cardiovascular: Normal rate and regular rhythm.  Respiratory: Effort normal, non-labored breathing.  Skin: WDI.   Neurologic Examination    NEURO:  Mental Status: Patient is oriented to person and place, disoriented to time and situation Speech/Language: speech is without dysarthria some difficulties with naming noted  Cranial Nerves:  II: PERRL. Visual fields full.  III, IV, VI: EOMI. Eyelids elevate symmetrically.  V: Sensation is intact to light touch and symmetrical to face.  VII: Right facial droop VIII: hearing intact to voice. IX, X: Phonation is normal.  XII: tongue is midline without fasciculations. Motor: Able to move all 4 extremities with antigravity strength Tone: is normal and bulk is normal Sensation- Intact to light touch bilaterally. Extinction absent to light touch to DSS.  Coordination: FTN intact bilaterally Gait- deferred  NIHSS:  1a Level of Conscious.: 0 1b LOC  Questions: 2 1c LOC Commands: 0 2 Best Gaze: 0 3 Visual: 0 4 Facial Palsy: 1 5a Motor Arm - left: 0 5b Motor Arm - Right: 0 6a Motor Leg - Left: 0 6b Motor Leg - Right: 0 7 Limb Ataxia: 0 8 Sensory: 0 9 Best Language: 1 10 Dysarthria: 0 11 Extinct and Inattention.: 0 TOTAL: 4     Medications  Current Facility-Administered Medications:    feeding supplement (ENSURE PLUS HIGH PROTEIN) liquid 237 mL, 237 mL, Oral, BID BM, Bender, Emily, DO, 237 mL at 02/18/24 0939   multivitamin with minerals tablet 1 tablet, 1 tablet, Oral, Daily, Kandis Perkins, DO, 1 tablet at 02/18/24 0939  Labs and Diagnostic Imaging   CBC:  Recent Labs  Lab 02/14/24 1024 02/14/24 1028 02/17/24 0227 02/18/24 0259  WBC 11.8*   < > 10.2 9.1  NEUTROABS 8.0*  --   --   --   HGB 14.6   < > 12.8* 12.4*  HCT 44.8   < > 39.7 38.8*  MCV 91.1   < > 90.4 92.2  PLT 142*   < > 156 195   < > = values in this interval not displayed.    Basic Metabolic Panel:  Lab Results  Component Value Date   NA 137 02/17/2024   K 3.7 02/17/2024   CO2 24 02/17/2024   GLUCOSE 88 02/17/2024   BUN 12 02/17/2024   CREATININE 1.03 02/17/2024   CALCIUM 8.5 (L) 02/17/2024  GFRNONAA >60 02/17/2024   GFRAA >60 10/27/2019   Lipid Panel:  Lab Results  Component Value Date   LDLCALC 106 (H) 02/15/2024   HgbA1c:  Lab Results  Component Value Date   HGBA1C 5.1 02/14/2024   Urine Drug Screen: No results found for: LABOPIA, COCAINSCRNUR, LABBENZ, AMPHETMU, THCU, LABBARB  Alcohol Level     Component Value Date/Time   ETH <15 02/14/2024 1024   INR  Lab Results  Component Value Date   INR 1.2 02/17/2024   APTT  Lab Results  Component Value Date   APTT 29 02/14/2024   AED levels: No results found for: PHENYTOIN, ZONISAMIDE, LAMOTRIGINE, LEVETIRACETA  CT Head without contrast(Personally reviewed): No acute abnormality.  Subacute left thalamic infarct with expected delusionally changes.     MRI  Brain(Personally reviewed): Numerous foci of contrast-enhancement within cerebral cortex and subcortical white matter, possibly brain mets with multiple areas of infarction, embolic appearing  rEEG:  Mild generalized slowing.  Assessment   Riley Wright is a 63 y.o. male with no significant past medical history who presented with altered mental status and altered speech.  He was found to have multiple acute infarcts, and an embolic pattern on MRI as well as possible brain metastases.  There is a question of new pancreatic cancer, and patient is awaiting biopsy of liver mass.  Today, patient was found to be unresponsive with pinpoint pupils.  Upon neurology arrival, he would localize a vigorous sternal rub with his left hand but did not move his right upper extremity and was noted to have a right facial droop.  The symptoms improved as time went on with patient eventually being able to move his right upper extremity normally.  He remains confused, but this appears to be his baseline.  CT head revealed no acute changes.  Given possible brain metastases and recent strokes, patient is at high risk for seizure activity, and it is possible that he had an unwitnessed seizure with postictal lethargy and Todd's paralysis.  Will obtain EEG.  Recommendations  -Routine EEG, will consider AEDs if abnormalities seen - Stroke team to follow ______________________________________________________________________ Patient seen by NP with MD, MD to edit note as needed.  Signed, Cortney E Everitt Clint Kill, NP [facility]   I have personally obtained history,examined this patient, reviewed notes, independently viewed imaging studies, participated in medical decision making and plan of care.ROS completed by me personally and pertinent positives fully documented  I have made any additions or clarifications directly to the above note. Agree with note above.  Patient known to the stroke service and we had signed off  few days ago.  This morning was found to be obtunded and poorly responsive.  Responded to sternal rub and by the time he went down for stat code stroke CT he was well responsive and back to his baseline.  CT head shows no acute abnormality with expected delusional changes in his recent strokes.  Stat EEG was obtained which showed mild generalized slowing and no ongoing seizure activity.  Etiology of patient's sudden brief unresponsiveness is unclear at this time.  Recommend continue IV heparin till ultrasound-guided biopsy of liver lesion and then switch to Eliquis.  Prognosis remains poor given his malignancy, bilateral lower extremity DVTs and pulmonary embolism and brain metastasis and strokes.  No family at the bedside for discussion.  Discussed with patient and bedside RN and answered questions.    I personally spent a total of 50 minutes in the care of  the patient today including getting/reviewing separately obtained history, performing a medically appropriate exam/evaluation, counseling and educating, placing orders, referring and communicating with other health care professionals, documenting clinical information in the EHR, independently interpreting results, and coordinating care.         Eather Popp, MD Medical Director [facility] Pager: [phone] 02/18/2024 2:25 PM

## 2024-02-18 NOTE — Progress Notes (Signed)
 Palliative:  HPI: 63 y.o. male  with past medical history of lumbar decompression admitted on 02/14/2024 with altered speech and behavior and poor appetite. 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 Also with concern for suspected pancreatic cancer with mets to liver.   I met again today with Mr. Kawano. He is lying in bed. He is appropriate in conversation (still with poor recall). He is asking when he gets to go home. I explained that we still have some more testing to do and he is accepting. Very pleasant in interaction. A friend of his is at bedside as well as son [name].   I spoke with [name] outside the room. He continues to process his father's changes and what lies ahead. He tells me that he told his mother's family (an aunt that lives locally) what is going on. His aunt was here visiting not long ago. Seems she will be able to be a good support to [place]. [name] shares issues he is having with their home and landlord. I will write him a letter tomorrow to give vague details on situation to see if this will help his situation. [name] is appreciative of the support.   All questions/concerns addressed. Emotional support provided. Discussed with CSW.   Allowing time for sons to process. I believe having biopsy and results will help. Ongoing palliative conversations as well as conversations from oncology will be helpful. Difficult situation. Mr. Heinzman will need placement. He is not eligible for hospice facility (although he would be eligible for hospice at home or hospice as an additional support at a facility such as LTC/ALF).   Exam: Alert, appropriate but with underlying confusion and poor recall. Calm and pleasant. No distress. Breathing regular, unlabored. Abd flat.   Plan: - Ongoing palliative support and conversations needed  35 min  Bernarda Kitty, NP Palliative Medicine Team Pager [phone] (Please see [email or web address] for schedule) Team Phone [phone]

## 2024-02-18 NOTE — Telephone Encounter (Signed)
 Patient Product/process development scientist completed.    The patient is insured through [facility]. Patient has Medicare and is not eligible for a copay card, but may be able to apply for patient assistance or Medicare RX Payment Plan (Patient Must reach out to their plan, if eligible for payment plan), if available.    Ran test claim for Eliquis 5 mg and the current 30 day co-pay is $302.00 due to a $255.00 deductible.  Will be $47.00 once deductible is met.   This test claim was processed through [facility]- copay amounts may vary at other pharmacies due to pharmacy/plan contracts, or as the patient moves through the different stages of their insurance plan.     Reyes Sharps, CPHT Pharmacy Technician Patient Advocate Specialist [facility] Patient Advocate Team Direct Number: [phone]  Fax: [phone]

## 2024-02-18 NOTE — Progress Notes (Signed)
 Subjective: Riley Wright is a 63 y.o. 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 Pt was placed under IVC on 11/2.   Overnight, Vital signs stable, labs non-concerning. Pt was somnolent this morning and a code stroke was called. He was at his new baseline within the hour prior to our arrival, and would not arouse to painful stimulation. With consistent painful stimulation, pt awoke out of his stupor more, and came back to near his confused new-baseline, with facial droop. STAT CT not revealing.  Objective:  Vital signs in last 24 hours: Vitals:   02/17/24 2003 02/18/24 0008 02/18/24 0611 02/18/24 0951  BP: 117/77 132/78 106/64 107/71  Pulse: 73 76 70 62  Resp: 17 16 15 18   Temp: 98.4 F (36.9 C) 99.2 F (37.3 C) 97.8 F (36.6 C) 99.1 F (37.3 C)  TempSrc: Oral Oral Oral Oral  SpO2: 96% 98% 100% 99%  Weight:      Height:        Physical Exam Vitals and nursing note reviewed. Exam conducted with a chaperone present.  Constitutional:      General: He is not in acute distress.    Appearance: He is not toxic-appearing.  Cardiovascular:     Rate and Rhythm: Normal rate and regular rhythm.     Pulses: Normal pulses.     Heart sounds: No murmur heard. Pulmonary:     Effort: Pulmonary effort is normal. No respiratory distress.     Breath sounds: Normal breath sounds.  Musculoskeletal:     Right lower leg: No edema.     Left lower leg: No edema.  Neurological:     Mental Status: He is confused and unresponsive.     GCS: GCS eye subscore is 2. GCS verbal subscore is 4. GCS motor subscore is 6.     Sensory: Sensation is intact.     Motor: Motor function is intact.     Coordination: Coordination is intact.     Comments: Initially GCS 4, rose to 12. R facial droop.   Optic Aphasia: Unable to name TV or  Clock. Expressive Aphasia: Conversationally improved      Latest Ref Rng & Units 02/18/2024    2:59 AM 02/17/2024    2:27 AM 02/16/2024    3:27 AM  CBC  WBC 4.0 - 10.5 K/uL 9.1  10.2  12.9   Hemoglobin 13.0 - 17.0 g/dL 87.5  87.1  86.8   Hematocrit 39.0 - 52.0 % 38.8  39.7  40.6   Platelets 150 - 400 K/uL 195  156  132         Latest Ref Rng & Units 02/17/2024    2:27 AM 02/16/2024    3:27 AM 02/15/2024    5:07 AM  BMP  Glucose 70 - 99 mg/dL 88  90  99   BUN 8 - 23 mg/dL 12  12  11    Creatinine 0.61 - 1.24 mg/dL 8.96  8.98  8.98   Sodium 135 - 145 mmol/L 137  136  136   Potassium 3.5 - 5.1 mmol/L 3.7  3.9  3.7   Chloride 98 - 111 mmol/L 98  98  97   CO2 22 - 32 mmol/L 24  25  26    Calcium 8.9 - 10.3 mg/dL 8.5  8.9  8.6    Assessment/Plan:  Principal Problem:   Metastatic cancer (HCC) Active Problems:   Acute CVA (cerebrovascular accident) (HCC)   Elevated ALT measurement   Elevated AST (SGOT)   Leukocytosis   Liver masses   Mass of pancreas   Pulmonary embolus (HCC)   DVT (deep venous thrombosis) (HCC)   Elevated alkaline phosphatase level   #AMS Pt required code stroke for temporary altered mental status of GCS 4 and somnolence. Stat CT negative.  - Neurology recs   - EEG complete, results pending  #Metastatic cancer, suspected pancreatic as primary source #Elevated AST, ALT, Alkaline phosphatase  #Metastatic masses in the liver  Patient presented as a suspected acute stroke with elevated AST and ALT. RUQ US  demonstrated multiple solid appearing hepatic masses and a CT of the chest/ abd/pelvis showed 4.9 x 2.4 cm hypodense mass within the pancreatic body, highly concerning for pancreatic neoplasm with metastasis to the lung and liver. IR was consulted with plans to proceed with a biopsy of the liver mass on Friday for confirmation. Plan: -Liver biopsy 11/7 with IR  - Scheduled for Friday d/t 5 day Aspirin washout period -Continue to monitor CMP, CBC, INR -Follow  up CEA, AFP, CA 19-9 levels -Appreciate oncology recommendations  - Awaiting biopsy results, scheduled for Friday -Palliative care consulted  - Family deciding GOC  - Pt not a hospice candidate  #Metastatic lesions of the brain vs acute embolic CVAs  Patient initially thought to have embolic CVAs in the brain. Discussed case with neurology yesterday who favors metastatic brain lesions given the enhancement with contrast, over embolic strokes.  Plan: -Echocardiogram and Transcranial doppler w/ bubble: negative for PFO -TTE negative for valvular vegetations - TEE not necessary as patient is already on heparin for DVT treatment -Appreciate neurology recommendations  -D/cd ASA and plavix   #PE #DVT  Patient was found to have an incidental PE on chest CT and also found to have bilateral acute deep vein thrombosis involving the left and right posterior tibial veins, and left and right peroneal veins. High suspicion for trousseau syndrome. He was started on a heparin drip yesterday. Patient remains HDS on RA.  Plan: -Continue heparin drip -Will bridge off heparin drip once liver biopsy has been completed  #Leukocytosis: Stable and likely reactive   Resolved Problems:  #AGMA __________________________________  Code Status: Full VTE Prophylaxis:Heparin drip Diet:Regular  IVF:N/A Barriers to Discharge:Evaluation of rehab needs, Liver biopsy  Dispo: Anticipated discharge in approximately 3-5 day(s).   [name], DO 02/18/2024, 11:23 AM Please contact the on call pager at: [phone]

## 2024-02-18 NOTE — Code Documentation (Signed)
 Stroke Response Nurse Documentation Code Documentation  Riley Wright is a 63 y.o. male admitted to [facility]  on 02/14/2024 for altered mental status and altered speech with no past medical history. He was found to heave multiple acute infarcts and possible brain metastases. Awaiting liver mass biopsy. On heparin IV for PE.   Patient on 3W unit where he was LKW at 0915 and now found to be unresponsive to stimuli with pinpoint pupils. No opiods or pain medications of any kind administered. Shortly after, he was able to localize with his left arm. New right facial droop and right arm weakness noted. Code stroke was activated by 3W staff. Heparin gtt stopped. STAT CT ordered. Stroke team arrived and noted facial droop and confusion. Confusion reported to not be new finding.   Patient to CT with team. NIHSS 3, see documentation for details and code stroke times. Patient with disoriented and right facial droop on exam. The following imaging was completed:  CT Head. Patient is not a candidate for IV Thrombolytic due to stroke not suspected. Patient is not a candidate for IR due to no LVO.   Care/Plan: EEG ordered, restart heparin gtt.   Bedside handoff with RN.    Madelin Manila Stroke Response RN

## 2024-02-18 NOTE — Procedures (Signed)
 Patient Name: DENZEL ETIENNE  MRN: [id number]  Epilepsy Attending: Arlin MALVA Krebs  Referring Physician/Provider: Merrianne Locus, MD  Date: 02/18/2024 Duration: 23.20 mins  Patient history: 63 y.o. with a pertinent no known PMH, who presented with receptive and expressive aphasia. EEG to evaluate for seizure  Level of alertness: Awake, asleep  AEDs during EEG study: None  Technical aspects: This EEG study was done with scalp electrodes positioned according to the 10-20 International system of electrode placement. Electrical activity was reviewed with band pass filter of 1-70Hz , sensitivity of 7 uV/mm, display speed of 50mm/sec with a 60Hz  notched filter applied as appropriate. EEG data were recorded continuously and digitally stored.  Video monitoring was available and reviewed as appropriate.  Description: The posterior dominant rhythm consists of 8Hz  activity of moderate voltage (25-35 uV) seen predominantly in posterior head regions, symmetric and reactive to eye opening and eye closing. Sleep was characterized by vertex waves, sleep spindles (12 to 14 Hz), maximal frontocentral region. There is intermittent generalized 3 to 6 Hz theta-delta slowing. Hyperventilation and photic stimulation were not performed.     ABNORMALITY - Intermittent slow, generalized  IMPRESSION: This study is suggestive of mild generalized cerebral dysfunction (encephalopathy). No seizures or epileptiform discharges were seen throughout the recording.  Yusif Gnau O Esra Frankowski

## 2024-02-18 NOTE — Progress Notes (Signed)
 IP PROGRESS NOTE  Subjective:   Mr. Riley Wright has no complaint.  No family was present when I saw him at approximately 6 AM.  A sitter was at the bedside.  Objective: Vital signs in last 24 hours: Blood pressure 114/75, pulse 64, temperature 98 F (36.7 C), temperature source Oral, resp. rate 17, height 6' 2 (1.88 m), weight 207 lb 9.6 oz (94.2 kg), SpO2 99%.  Intake/Output from previous day: 11/03 0701 - 11/04 0700 In: 600 [P.O.:600] Out: -   Physical Exam: Abdomen: Fullness in the right upper abdomen, no splenomegaly, nontender Neurologic: Alert, follows commands, moves extremities to command.  Oriented to city and hospital.  Not oriented to diagnosis or situation.   Lab Results: Recent Labs    02/17/24 0227 02/18/24 0259  WBC Wright.2 9.1  HGB 12.8* 12.4*  HCT 39.7 38.8*  PLT 156 195    BMET Recent Labs    02/16/24 0327 02/17/24 0227  NA 136 137  K 3.9 3.7  CL 98 98  CO2 25 24  GLUCOSE 90 88  BUN 12 12  CREATININE 1.01 1.03  CALCIUM 8.9 8.5*    Lab Results  Component Value Date   CEA1 182.0 (H) 02/16/2024   RJW800 11 02/16/2024    Studies/Results: EEG adult Result Date: 02/18/2024 Riley Arlin KIDD, MD     02/18/2024 12:40 PM Patient Name: Riley Wright MRN: [id number] Epilepsy Attending: Arlin KIDD Riley Referring Physician/Provider: Merrianne Locus, MD Date: 02/18/2024 Duration: 23.20 mins Patient history: 63 y.o. with a pertinent no known PMH, who presented with receptive and expressive aphasia. EEG to evaluate for seizure Level of alertness: Awake, asleep AEDs during EEG study: None Technical aspects: This EEG study was done with scalp electrodes positioned according to the Wright-20 International system of electrode placement. Electrical activity was reviewed with band pass filter of 1-70Hz , sensitivity of 7 uV/mm, display speed of 73mm/sec with a 60Hz  notched filter applied as appropriate. EEG data were recorded continuously and digitally stored.  Video monitoring was  available and reviewed as appropriate. Description: The posterior dominant rhythm consists of 8Hz  activity of moderate voltage (25-35 uV) seen predominantly in posterior head regions, symmetric and reactive to eye opening and eye closing. Sleep was characterized by vertex waves, sleep spindles (12 to 14 Hz), maximal frontocentral region. There is intermittent generalized 3 to 6 Hz theta-delta slowing. Hyperventilation and photic stimulation were not performed.   ABNORMALITY - Intermittent slow, generalized IMPRESSION: This study is suggestive of mild generalized cerebral dysfunction (encephalopathy). No seizures or epileptiform discharges were seen throughout the recording. Arlin KIDD Riley   CT HEAD CODE STROKE WO CONTRAST Result Date: 02/18/2024 EXAM: CT HEAD WITHOUT CONTRAST 02/18/2024 Wright:14:16 AM TECHNIQUE: CT of the head was performed without the administration of intravenous contrast. Automated exposure control, iterative reconstruction, and/or weight based adjustment of the mA/kV was utilized to reduce the radiation dose to as low as reasonably achievable. COMPARISON: PET CT 02/15/2024 and earlier. Numerous embolic appearing infarcts on MRI last month. CLINICAL HISTORY: 63 year old male. Neuro deficit, acute, stroke suspected. Numerous embolic appearing infarcts on MRI last month. FINDINGS: BRAIN AND VENTRICLES: No acute intracranial hemorrhage. No mass effect or midline shift. Left thalamic infarct redemonstrated. Small left cerebellar infarct also now apparent by CT. Other small bilateral infarcts less apparent. No new site of toxic edema identified. No suspicious intracranial vascular hyperdensity. No hydrocephalus. No extra-axial collection. ORBITS: No acute abnormality. SINUSES: No acute abnormality. SOFT TISSUES AND SKULL: No acute soft tissue abnormality. No  skull fracture. Riley Wright IMPRESSION: 1. Numerous small recent infarcts, the largest visible by CT includes the left thalamus. No hemorrhagic transformation or mass effect. 2. No new intracranial abnormality. ASPECTS Wright. 3. These results were communicated to Dr. Lindzen at Wright:22 hours on 02/18/2024 by text page via the [facility] messaging system. Electronically signed by: Helayne Hurst MD 02/18/2024 Wright:24 AM EST RP Workstation: [id number]   VAS US  TRANSCRANIAL DOPPLER W BUBBLES Result Date: 02/17/2024  Transcranial Doppler with Bubble Patient Name:  Riley Wright  Date of Exam:   02/16/2024 Medical Rec #: [id number]      Accession #:    [id number] Date of Birth: 14-Oct-1960      Patient Gender: M Patient Age:   39 years Exam Location[facility]  [facility] Procedure:      VAS US  TRANSCRANIAL DOPPLER W BUBBLES Referring Phys: PRAMOD SETHI --------------------------------------------------------------------------------  Indications: Stroke. History: Pulmonary embolism, acute DVT in the bilateral posterior tibial and peroneal veins. Embolic stroke vs. brain metasstases, left sided pulmonary embolism in the setting of new, incidental finding of large pancreatic mass concerning for neoplasm and several hepatic lesions concerning for metastases.  Comparison Study: No prior study Performing Technologist: Riley Lis RVS  Examination Guidelines: A complete evaluation includes B-mode imaging, spectral Doppler, color Doppler, and power Doppler as needed of all accessible portions of each vessel. Bilateral testing is considered an integral part of a complete examination. Limited examinations for reoccurring indications may be performed as noted.  Summary: No HITS at rest or during Valsalva. Negative transcranial Doppler Bubble study with no evidence of right to left intracardiac communication.  A vascular evaluation was performed. The left middle cerebral artery was studied. An IV was inserted into the patient's left  forearm. Verbal informed consent was obtained.   Diagnosing physician: Eather Popp MD Electronically signed by Eather Popp MD on 02/17/2024 at 11:58:54 AM.    Final     Medications: I have reviewed the patient's current medications.  Assessment/Plan: Pancreas mass, liver/lung lesions Wright/31/2025 CTs: 4.9 x 2.4 cm hypodense mass in the pancreas body, innumerable hypodense liver metastases, innumerable subcentimeter bilateral pulmonary nodules, peripheral wedge-shaped hypodensities in the spleen 2.   Pulmonary embolism on CT chest Wright/31/2025, no right heart strain 02/15/2024 lower extremity Dopplers: Acute DVT at the right posterior tibial and peroneal veins, left posterior tibial and peroneal veins 3.   Multivessel distribution CVAs Wright/31/2025 CT head: Diminished attenuation in the left thalamus concerning for a subacute infarct, focal area of subcortical infarct in the left posterior temporal lobe, multiple areas of age-indeterminate infarction within cerebellar hemispheres-worse on the left Wright/31/2025 MRI brain: Numerous acute ischemic foci in both cerebellar and cerebral hemispheres consistent with emboli, multifocal cerebral T2 hyperintensities consistent with chronic small vessel disease 02/15/2024 MRI brain with contrast: Numerous nodular and linear foci of contrast enhancement, numerous areas of ischemia with no contrast-enhancement 4.  Elevated liver enzymes and bilirubin secondary to #1 5.  Thrombocytopenia 6.  Altered mental status/partial aphasia secondary to #2   Riley Wright appears unchanged when I saw him at approximately 6 AM.  He is scheduled for operative a liver lesion on 02/21/2024.  The liver biopsy is delayed due to aspirin given over the weekend.  The clinical presentation is consistent with metastatic pancreas cancer.  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  He also has bilateral lower extremity DVTs and a  pulmonary embolism.  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  Riley Wright remains confused and does not appear to understand his diagnosis.  He was more oriented when I saw him early this morning.  I reviewed notes from later in the morning when he became unresponsive with a facial droop.  This may have been related to another stroke or seizure, though a CT revealed no acute finding and an EEG did not reveal seizures.  Riley Wright has a poor prognosis.  He will not be a candidate for treatment of the malignancy unless his mental status improves.  I am available to discuss the prognosis and treatment options with his family.  I will check on him over the next few days, though we will not have a pathology result available until early next week.  Recommendations: Continue supportive care, poststroke care per the medical and neurology services Heparin anticoagulation until he undergoes the liver biopsy procedure Ultrasound-guided biopsy of a liver lesion Please call oncology as needed, I will check on him in the early a.m. 02/20/2024, let me know if it would be helpful for me to discuss the prognosis with his family  LOS: 4 days   Arley Hof, MD   02/18/2024, 1:41 PM

## 2024-02-18 NOTE — Progress Notes (Signed)
 EEG complete - results pending

## 2024-02-19 DIAGNOSIS — I824Y9 Acute embolism and thrombosis of unspecified deep veins of unspecified proximal lower extremity: Secondary | ICD-10-CM | POA: Diagnosis not present

## 2024-02-19 DIAGNOSIS — R569 Unspecified convulsions: Secondary | ICD-10-CM | POA: Diagnosis not present

## 2024-02-19 DIAGNOSIS — G9389 Other specified disorders of brain: Secondary | ICD-10-CM | POA: Diagnosis not present

## 2024-02-19 DIAGNOSIS — G8384 Todd's paralysis (postepileptic): Secondary | ICD-10-CM | POA: Diagnosis not present

## 2024-02-19 DIAGNOSIS — C787 Secondary malignant neoplasm of liver and intrahepatic bile duct: Secondary | ICD-10-CM | POA: Diagnosis not present

## 2024-02-19 LAB — CULTURE, BLOOD (ROUTINE X 2)
Culture  Setup Time: NO GROWTH
Culture: NO GROWTH

## 2024-02-19 LAB — CBC
HCT: 39.9 % (ref 39.0–52.0)
Hemoglobin: 12.6 g/dL — ABNORMAL LOW (ref 13.0–17.0)
MCH: 29 pg (ref 26.0–34.0)
MCHC: 31.6 g/dL (ref 30.0–36.0)
MCV: 91.7 fL (ref 80.0–100.0)
Platelets: 212 K/uL (ref 150–400)
RBC: 4.35 MIL/uL (ref 4.22–5.81)
RDW: 13.2 % (ref 11.5–15.5)
WBC: 9.5 K/uL (ref 4.0–10.5)
nRBC: 0 % (ref 0.0–0.2)

## 2024-02-19 LAB — HEPARIN LEVEL (UNFRACTIONATED): Heparin Unfractionated: 0.44 [IU]/mL (ref 0.30–0.70)

## 2024-02-19 MED ORDER — SENNOSIDES-DOCUSATE SODIUM 8.6-50 MG PO TABS
2.0000 | ORAL_TABLET | Freq: Every day | ORAL | Status: DC
  Administered 2024-02-19 – 2024-02-25 (×5): 2 via ORAL
  Filled 2024-02-19 (×5): qty 2

## 2024-02-19 MED ORDER — ORAL CARE MOUTH RINSE: 15.0000 mL | OROMUCOSAL | Status: DC | PRN

## 2024-02-19 MED ORDER — POLYETHYLENE GLYCOL 3350 17 G PO PACK
17.0000 g | PACK | Freq: Two times a day (BID) | ORAL | Status: DC
  Administered 2024-02-19 – 2024-02-26 (×9): 17 g via ORAL
  Filled 2024-02-19 (×12): qty 1

## 2024-02-19 NOTE — Progress Notes (Signed)
 Speech Language Pathology Treatment: Cognitive-Linguistic  Patient Details Name: Riley Wright MRN: [id number] DOB: 09/29/1960 Today's Date: 02/19/2024 Time: 1430-1450 SLP Time Calculation (min) (ACUTE ONLY): 20 min  Assessment / Plan / Recommendation Clinical Impression  Skilled therapy session focused on communication goals. SLP facilitated session by prompting patient to participate in confrontational naming task using physical objects from around the room. Patient with 25% accuracy independently, requiring maxA for remaining objects. Patient continues to present with perseveration and spontaneous phrases including I don't know. Patient benefits from repetition and was able to identify 5/6 written single words independently. SLP continued to target expressive language through automatic speech tasks. Patient with 100% accuracy during counting 1-10 and days of the week given initial item in a sequence. Patient required minA to recall months of the year. Patient left in bed with alarm set and call bell in reach. Continue POC.    HPI HPI: 63 y.o. male presents to [facility]/14/24 with abnormal speech and cognition. MRI showed numerous acute ischemic foci in both cerebellar and cerebral hemispheres consistent with emboli. 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 Pt also with PE and B LE DVTs. No pertinent pmhx.      SLP Plan  Continue with current plan of care    Recommendations          Frequent or constant Supervision/Assistance Aphasia (R47.01);Cognitive communication deficit (R41.841)     Continue with current plan of care    Marthella Osorno M.A., CCC-SLP 02/19/2024, 2:58 PM

## 2024-02-19 NOTE — Plan of Care (Signed)
  Problem: Education: Goal: Knowledge of disease or condition will improve Outcome: Progressing   Problem: Ischemic Stroke/TIA Tissue Perfusion: Goal: Complications of ischemic stroke/TIA will be minimized Outcome: Progressing   Problem: Health Behavior/Discharge Planning: Goal: Ability to manage health-related needs will improve Outcome: Progressing   Problem: Self-Care: Goal: Ability to participate in self-care as condition permits will improve Outcome: Progressing   Problem: Clinical Measurements: Goal: Will remain free from infection Outcome: Progressing   Problem: Activity: Goal: Risk for activity intolerance will decrease Outcome: Progressing   Problem: Coping: Goal: Level of anxiety will decrease Outcome: Progressing

## 2024-02-19 NOTE — Progress Notes (Signed)
 Occupational Therapy Treatment Patient Details Name: Riley Wright MRN: [id number] DOB: 1960-09-30 Today's Date: 02/19/2024   History of present illness 63 y.o. male presents to [facility] 02/14/24 with abnormal speech and cognition. MRI showed numerous acute ischemic foci in both cerebellar and cerebral hemispheres consistent with emboli. 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 Pt also with PE and B LE DVTs. No pertinent pmhx.   OT comments  Patient seen in order to assess cognition further. Patient remains disoriented to time, place, and situation. OT providing clock assessment, with patient making 5 errors labeling the clock, then omitting 11 o clock. Unable to set the time correctly (ten past eleven), and did not realize there was not an 11 on his drawing. Patient also with deficits in delayed ecall, and increased time required to write his first and last name. Patient completing grooming sitting EOB at set up level. OT recomendation remains appropriate, with 24/7 supervision imperative for patient's safety. OT will continue to follow acutely.       If plan is discharge home, recommend the following:  A little help with walking and/or transfers;A little help with bathing/dressing/bathroom;Assistance with cooking/housework;Assistance with feeding;Direct supervision/assist for medications management;Direct supervision/assist for financial management;Assist for transportation;Help with stairs or ramp for entrance;Supervision due to cognitive status   Equipment Recommendations  None recommended by OT    Recommendations for Other Services      Precautions / Restrictions Precautions Precautions: Fall Recall of Precautions/Restrictions: Impaired Restrictions Weight Bearing Restrictions Per Provider Order: No       Mobility Bed Mobility Overal bed mobility: Needs Assistance Bed Mobility: Supine to Sit     Supine to sit: Supervision           Transfers Overall transfer level: Needs assistance                       Balance Overall balance assessment: Needs assistance Sitting-balance support: No upper extremity supported, Feet supported Sitting balance-Leahy Scale: Good     Standing balance support: No upper extremity supported, During functional activity Standing balance-Leahy Scale: Good                             ADL either performed or assessed with clinical judgement   ADL Overall ADL's : Needs assistance/impaired     Grooming: Set up;Sitting;Wash/dry hands;Wash/dry face;Oral care                               Functional mobility during ADLs: Minimal assistance;Cueing for safety;Cueing for sequencing General ADL Comments: Patient seen in order to assess cognition further. Patient remains disoriented to time, place, and situation. OT providing clock assessment, with patient making 5 errors labeling the clock, then omitting 11 o clock. Unable to set the time correctly (ten past eleven), and did not realize there was not an 11 on his drawing. Patient also with deficits in delayed ecall, and increased time required to write his first and last name.OT recomendation remains appropriate, with 24/7 supervision imperative for patient's safety. OT will continue to follow acutely.    Extremity/Trunk Assessment         Cervical / Trunk Assessment Cervical / Trunk Assessment: Normal    Vision Baseline Vision/History: 0 No visual deficits Ability to See in Adequate Light: 0 Adequate Patient Visual Report: No change from baseline     Perception Perception  Perception: Not tested   Praxis Praxis Praxis: Not tested   Communication Communication Communication: Impaired Factors Affecting Communication: Reduced clarity of speech   Cognition Arousal: Alert Behavior During Therapy: WFL for tasks assessed/performed Cognition: Cognition impaired   Orientation impairments: Place, Time,  Situation Awareness: Intellectual awareness impaired, Online awareness impaired Memory impairment (select all impairments): Short-term memory, Working memory Attention impairment (select first level of impairment): Focused attention Executive functioning impairment (select all impairments): Initiation, Organization, Sequencing, Reasoning, Problem solving OT - Cognition Comments: Unable to recall date or place after it was given to him when delayed recall was assessed, clock test provided with significant difficulty                 Following commands: Impaired Following commands impaired: Follows one step commands inconsistently, Follows multi-step commands inconsistently      Cueing   Cueing Techniques: Verbal cues, Gestural cues  Exercises      Shoulder Instructions       General Comments      Pertinent Vitals/ Pain       Pain Assessment Pain Assessment: No/denies pain Faces Pain Scale: No hurt  Home Living Family/patient expects to be discharged to:: Private residence Living Arrangements: Children Available Help at Discharge: Family;Available PRN/intermittently Type of Home: Mobile home Home Access: Level entry     Home Layout: One level     Bathroom Shower/Tub: Chief Strategy Officer: Standard Bathroom Accessibility: Yes   Home Equipment: Grab bars - toilet;Grab bars - tub/shower   Additional Comments: Unsure if pt reliable historian, family not present to confirm.      Prior Functioning/Environment              Frequency  Min 2X/week        Progress Toward Goals  OT Goals(current goals can now be found in the care plan section)  Progress towards OT goals: Progressing toward goals  Acute Rehab OT Goals Patient Stated Goal: unable OT Goal Formulation: Patient unable to participate in goal setting Time For Goal Achievement: 03/02/24 Potential to Achieve Goals: Good  Plan      Co-evaluation                 AM-PAC OT  6 Clicks Daily Activity     Outcome Measure   Help from another person eating meals?: A Little Help from another person taking care of personal grooming?: A Little Help from another person toileting, which includes using toliet, bedpan, or urinal?: A Little Help from another person bathing (including washing, rinsing, drying)?: A Little Help from another person to put on and taking off regular upper body clothing?: A Little Help from another person to put on and taking off regular lower body clothing?: A Little 6 Click Score: 18    End of Session    OT Visit Diagnosis: Other abnormalities of gait and mobility (R26.89);Other symptoms and signs involving cognitive function;Cognitive communication deficit (R41.841) Symptoms and signs involving cognitive functions: Cerebral infarction   Activity Tolerance Patient tolerated treatment well   Patient Left with call bell/phone within reach;with family/visitor present;in bed;with nursing/sitter in room (sitting EOB)   Nurse Communication Mobility status        Time: 8964-8950 OT Time Calculation (min): 14 min  Charges: OT General Charges $OT Visit: 1 Visit OT Treatments $Self Care/Home Management : 8-22 mins  Ronal Gift E. Blossom Crume, OTR/L Acute Rehabilitation Services [phone]   Ronal Gift Salt 02/19/2024, 1:44 PM

## 2024-02-19 NOTE — Progress Notes (Addendum)
 PHARMACY - ANTICOAGULATION CONSULT NOTE  Pharmacy Consult for heparin  Indication: PE, splenic infarct, brain lesions  No Known Allergies  Patient Measurements: Height: 6' 2 (188 cm) Weight: 94.2 kg (207 lb 9.6 oz) IBW/kg (Calculated) : 82.2 HEPARIN DW (KG): 94.2  Vital Signs: Temp: 98.7 F (37.1 C) (11/05 0802) Temp Source: Oral (11/05 0802) BP: 118/76 (11/05 0802) Pulse Rate: 70 (11/05 0802)  Labs: Recent Labs    02/17/24 0227 02/18/24 0259 02/19/24 0140  HGB 12.8* 12.4* 12.6*  HCT 39.7 38.8* 39.9  PLT 156 195 212  LABPROT 16.3*  --   --   INR 1.2  --   --   HEPARINUNFRC 0.31 0.36 0.44  CREATININE 1.03  --   --     Estimated Creatinine Clearance: 86.5 mL/min (by C-G formula based on SCr of 1.03 mg/dL).   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 No anticoagulation prior to admission. Pharmacy consulted for heparin.    Heparin level 0.44 is therapeutic on 1750 units/hr. Hgb, PLT WNL.  Patient was unresponsive to stimulation yesterday, so CT head and neuro was consulted for stroke. CT head negative for stroke. EEG showed mild encephalopathy and no seizures. Neuro rec to continue heparin gtt.  Goal of Therapy:  Heparin level: 0.3-0.5 Monitor platelets by anticoagulation protocol: Yes   Plan:  Continue heparin infusion to 1750 units/hr Monitor daily heparin level, CBC, signs/symptoms of bleeding  F/u switch to apixaban after liver biopsy on 11/7  Izetta Carl, PharmD PGY1 Pharmacy Resident

## 2024-02-19 NOTE — Progress Notes (Signed)
 Palliative:  HPI: 63 y.o. male  with past medical history of lumbar decompression admitted on 02/14/2024 with altered speech and behavior and poor appetite. 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 Also with concern for suspected pancreatic cancer with mets to liver.   I met today with Riley Wright. I provided him with a letter noting that Riley Wright is currently ill and does not have capacity to make decisions for himself and noting his sons as next of kin. Riley Wright is having some issues with their landlord (he lives with his father) so he is trying to work with them. Riley Wright asks about connecting with financial counselor (I notified CSW who will reach out). Riley Wright shares that he and Riley Wright have not had a chance to speak further about DNR, etc. We discussed plans for biopsy. We reviewed why biopsy can be helpful to let us  know how to move forward. We discussed the importance of knowing what we are dealing with to make decisions. We discussed an example of anticipating his has an aggressive cancer. IF he is a candidate for treatment would this even make sense to pursue and put him through this if we know his time is limited and treatment may make him feel much worse. Riley Wright expresses understanding. I encouraged him to sit down and talk with his brother more about path forward.   I met with Riley Wright. He is resting calmly in bed. No distress. NT sitter at bedside and reports that Riley Wright does have some episodes of agitation still.   All questions/concerns addressed. Emotional support provided.   Exam:  Alert, appropriate but with underlying confusion and poor recall. Calm and pleasant. No distress. Breathing regular, unlabored. Abd flat.    Plan: - Ongoing palliative support and conversations needed - Awaiting liver biopsy planned for Friday - I will ask my colleagues to follow up - I will return to service 11/11  35 min   Riley Kitty, NP Palliative Medicine Team Pager  [phone] (Please see [email or web address] for schedule) Team Phone [phone]

## 2024-02-19 NOTE — Plan of Care (Signed)
 Seems irritable, didn't have bowel movement since 10/31, informed to doctor, miralax  was prescribed refused to take it, education and counseling done regarding its action and importance of bowel motility and complication of not having bowel movement but refuses strictly , said he don't want miralax  and want to get rid of all these medical stuff. MD was notified.  Problem: Education: Goal: Knowledge of disease or condition will improve Outcome: Progressing   Problem: Education: Goal: Knowledge of secondary prevention will improve (MUST DOCUMENT ALL) Outcome: Progressing   Problem: Education: Goal: Knowledge of patient specific risk factors will improve (DELETE if not current risk factor) Outcome: Progressing   Problem: Ischemic Stroke/TIA Tissue Perfusion: Goal: Complications of ischemic stroke/TIA will be minimized Outcome: Progressing   Problem: Coping: Goal: Will verbalize positive feelings about self Outcome: Progressing   Problem: Health Behavior/Discharge Planning: Goal: Goals will be collaboratively established with patient/family Outcome: Progressing   Problem: Self-Care: Goal: Verbalization of feelings and concerns over difficulty with self-care will improve Outcome: Progressing   Problem: Nutrition: Goal: Dietary intake will improve Outcome: Progressing   Problem: Nutrition: Goal: Adequate nutrition will be maintained Outcome: Progressing

## 2024-02-19 NOTE — Progress Notes (Signed)
 Subjective: Riley Wright is a 63 y.o. 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 Pt was placed under IVC on 11/2.   Overnight, Vital signs stable, labs non-concerning. EEG from code stroke not specific. Pt was alert this morning with his son Riley Wright in the room. He was mildly annoyed that he was in the hospital, and not sure why he was in the hospital. He had no physical complaints including fever, SOB, N/V/D, chest pain, or abdominal pain. When asked when his last bowel movement was, he said it had been 2 years.  Objective:  Vital signs in last 24 hours: Vitals:   02/19/24 0000 02/19/24 0420 02/19/24 0802 02/19/24 1036  BP: 107/68 116/80 118/76 129/80  Pulse: 77 72 70 70  Resp: 18 19 20 18   Temp: 99.1 F (37.3 C) 98.9 F (37.2 C) 98.7 F (37.1 C) 98.3 F (36.8 C)  TempSrc: Oral Oral Oral Oral  SpO2: 96% 96% 95% 97%  Weight:      Height:        Physical Exam Vitals and nursing note reviewed. Exam conducted with a chaperone present.  Constitutional:      General: He is not in acute distress.    Appearance: He is not toxic-appearing.  Cardiovascular:     Rate and Rhythm: Normal rate and regular rhythm.     Pulses: Normal pulses.     Heart sounds: No murmur heard. Pulmonary:     Effort: Pulmonary effort is normal. No respiratory distress.     Breath sounds: Normal breath sounds.  Musculoskeletal:     Right lower leg: No edema.     Left lower leg: No edema.  Neurological:     Sensory: Sensation is intact.     Motor: Motor function is intact.     Coordination: Coordination is intact.   Expressive Aphasia: Conversationally impaired.      Latest Ref Rng & Units 02/19/2024    1:40 AM 02/18/2024    2:59 AM 02/17/2024    2:27 AM  CBC  WBC 4.0 - 10.5 K/uL 9.5  9.1  10.2   Hemoglobin 13.0 - 17.0 g/dL  87.3  87.5  87.1   Hematocrit 39.0 - 52.0 % 39.9  38.8  39.7   Platelets 150 - 400 K/uL 212  195  156         Latest Ref Rng & Units 02/17/2024    2:27 AM 02/16/2024    3:27 AM 02/15/2024    5:07 AM  BMP  Glucose 70 - 99 mg/dL 88  90  99   BUN 8 - 23 mg/dL 12  12  11    Creatinine 0.61 - 1.24 mg/dL 8.96  8.98  8.98   Sodium 135 - 145 mmol/L 137  136  136   Potassium 3.5 - 5.1 mmol/L 3.7  3.9  3.7   Chloride 98 - 111 mmol/L 98  98  97   CO2 22 - 32 mmol/L 24  25  26    Calcium 8.9 - 10.3 mg/dL 8.5  8.9  8.6    Assessment/Plan:  Principal Problem:   Metastatic cancer (HCC) Active Problems:   Acute CVA (cerebrovascular accident) (HCC)   Elevated ALT measurement   Elevated AST (SGOT)   Leukocytosis   Liver masses   Mass of pancreas   Pulmonary embolus (HCC)  DVT (deep venous thrombosis) (HCC)   Elevated alkaline phosphatase level   Altered mental status  #AMS Pt required code stroke for temporary altered mental status of GCS 4 and somnolence. Stat CT negative.  - Neurology recs   - EEG complete, results pending  #Metastatic cancer, suspected pancreatic as primary source #Elevated AST, ALT, Alkaline phosphatase  #Metastatic masses in the liver  Patient presented as a suspected acute stroke with elevated AST and ALT. RUQ US  demonstrated multiple solid appearing hepatic masses and a CT of the chest/ abd/pelvis showed 4.9 x 2.4 cm hypodense mass within the pancreatic body, highly concerning for pancreatic neoplasm with metastasis to the lung and liver. IR was consulted with plans to proceed with a biopsy of the liver mass on Friday for confirmation. Plan: -Liver biopsy 11/7 with IR  - Scheduled for Friday d/t 5 day Aspirin washout period -Continue to monitor CMP, CBC, INR -Follow up CEA, AFP, CA 19-9 levels -Appreciate oncology recommendations  - Awaiting biopsy results, scheduled for Friday -Palliative care consulted  - Family deciding GOC  - Pt not a hospice candidate -  No documented bowel movement since 10/31  - Senna and Miralax  BID, decrease frequency after we have a significant bowel movement.  #Metastatic lesions of the brain vs acute embolic CVAs  Patient initially thought to have embolic CVAs in the brain. Discussed case with neurology yesterday who favors metastatic brain lesions given the enhancement with contrast, over embolic strokes.  Plan: -Echocardiogram and Transcranial doppler w/ bubble: negative for PFO -TTE negative for valvular vegetations - TEE not necessary as patient is already on heparin for DVT treatment -Appreciate neurology recommendations  -D/cd ASA and plavix   #PE #DVT  Patient was found to have an incidental PE on chest CT and also found to have bilateral acute deep vein thrombosis involving the left and right posterior tibial veins, and left and right peroneal veins. High suspicion for trousseau syndrome. He was started on a heparin drip yesterday. Patient remains HDS on RA.  Plan: -Continue heparin drip -Will bridge off heparin drip once liver biopsy has been completed  #Leukocytosis: Stable and likely reactive   Resolved Problems:  #AGMA __________________________________  Code Status: Full VTE Prophylaxis:Heparin drip Diet:Regular  IVF:N/A Barriers to Discharge:Evaluation of rehab needs, Liver biopsy  Dispo: Anticipated discharge in approximately 3-5 day(s).   Riley Nancyann NOVAK, DO 02/19/2024, 1:48 PM Please contact the on call pager at: [phone]

## 2024-02-19 NOTE — Progress Notes (Addendum)
 NEUROLOGY CONSULT FOLLOW UP NOTE   Date of service: February 19, 2024 Patient Name: Riley Wright MRN:  [id number] DOB:  1960-06-14  Interval Hx/subjective   Son is at the bedside.  Patient is awake alert laying in bed in no apparent distress.  EEG obtained yesterday with generalized slowing, no seizures identified.  CT head with no acute changes Stroke team will sign off please call with questions or concerns Vitals   Vitals:   02/19/24 0000 02/19/24 0420 02/19/24 0802 02/19/24 1036  BP: 107/68 116/80 118/76 129/80  Pulse: 77 72 70 70  Resp: 18 19 20 18   Temp: 99.1 F (37.3 C) 98.9 F (37.2 C) 98.7 F (37.1 C) 98.3 F (36.8 C)  TempSrc: Oral Oral Oral Oral  SpO2: 96% 96% 95% 97%  Weight:      Height:         Body mass index is 26.65 kg/m.  Physical Exam  Constitutional: Appears well-developed and well-nourished.  Psych: Affect appropriate to situation.  Eyes: No scleral injection.  HENT: No OP obstrucion.  Head: Normocephalic.  Cardiovascular: Normal rate and regular rhythm.  Respiratory: Effort normal, non-labored breathing.  Skin: WDI.   Neurologic Examination   NEURO:  Mental Status: Patient is oriented to person and place, disoriented to time and situation Speech/Language: speech is without dysarthria some difficulties with naming noted   Cranial Nerves:  II: PERRL. Visual fields full.  III, IV, VI: EOMI. Eyelids elevate symmetrically.  V: Sensation is intact to light touch and symmetrical to face.  VII: Right facial droop VIII: hearing intact to voice. IX, X: Phonation is normal.  XII: tongue is midline without fasciculations. Motor: Able to move all 4 extremities with antigravity strength Tone: is normal and bulk is normal Sensation- Intact to light touch bilaterally. Extinction absent to light touch to DSS.  Coordination: FTN intact bilaterally Gait- deferred  Medications  Current Facility-Administered Medications:    feeding supplement (ENSURE  PLUS HIGH PROTEIN) liquid 237 mL, 237 mL, Oral, BID BM, Bender, Emily, DO, 237 mL at 02/19/24 1048   heparin ADULT infusion 100 units/mL (25000 units/250mL), 1,750 Units/hr, Intravenous, Continuous, Laurence Jinnie BIRCH, [facility], Last Rate: 17.5 mL/hr at 02/19/24 0057, 1,750 Units/hr at 02/19/24 0057   multivitamin with minerals tablet 1 tablet, 1 tablet, Oral, Daily, Kandis Perkins, DO, 1 tablet at 02/19/24 1048  Labs and Diagnostic Imaging   CBC:  Recent Labs  Lab 02/14/24 1024 02/14/24 1028 02/18/24 0259 02/19/24 0140  WBC 11.8*   < > 9.1 9.5  NEUTROABS 8.0*  --   --   --   HGB 14.6   < > 12.4* 12.6*  HCT 44.8   < > 38.8* 39.9  MCV 91.1   < > 92.2 91.7  PLT 142*   < > 195 212   < > = values in this interval not displayed.    Basic Metabolic Panel:  Lab Results  Component Value Date   NA 137 02/17/2024   K 3.7 02/17/2024   CO2 24 02/17/2024   GLUCOSE 88 02/17/2024   BUN 12 02/17/2024   CREATININE 1.03 02/17/2024   CALCIUM 8.5 (L) 02/17/2024   GFRNONAA >60 02/17/2024   GFRAA >60 10/27/2019   Lipid Panel:  Lab Results  Component Value Date   LDLCALC 106 (H) 02/15/2024   HgbA1c:  Lab Results  Component Value Date   HGBA1C 5.1 02/14/2024   Urine Drug Screen: No results found for: LABOPIA, COCAINSCRNUR, LABBENZ, AMPHETMU, THCU, LABBARB  Alcohol Level     Component Value Date/Time   [facility] <15 02/14/2024 1024   INR  Lab Results  Component Value Date   INR 1.2 02/17/2024   APTT  Lab Results  Component Value Date   APTT 29 02/14/2024   AED levels: No results found for: PHENYTOIN, ZONISAMIDE, LAMOTRIGINE, LEVETIRACETA  CT Head without contrast(Personally reviewed): No acute abnormality.  Subacute left thalamic infarct with expected delusionally changes.      MRI Brain(Personally reviewed): Numerous foci of contrast-enhancement within cerebral cortex and subcortical white matter, possibly brain mets with multiple areas of infarction, embolic appearing    rEEG:  Mild generalized slowing.  Assessment   Riley Wright is a 63 y.o. male with no significant past medical history who presented with altered mental status and altered speech.  He was found to have multiple acute infarcts, and an embolic pattern on MRI as well as possible brain metastases.  There is a question of new pancreatic cancer, and patient is awaiting biopsy of liver mass.  Today, patient was found to be unresponsive with pinpoint pupils.  Upon neurology arrival, he would localize a vigorous sternal rub with his left hand but did not move his right upper extremity and was noted to have a right facial droop.  The symptoms improved as time went on with patient eventually being able to move his right upper extremity normally.  He remains confused, but this appears to be his baseline.  CT head revealed no acute changes.  Given possible brain metastases and recent strokes, patient is at high risk for seizure activity, and it is possible that he had an unwitnessed seizure with postictal lethargy and Todd's paralysis.  However EEG did not show any seizure activity and showed only mild generalized slowing.    Recommendations  Neurology will sign off please call with questions or concerns ______________________________________________________________________   Signed, Karna DELENA Geralds, NP [facility]  I have personally obtained history,examined this patient, reviewed notes, independently viewed imaging studies, participated in medical decision making and plan of care.ROS completed by me personally and pertinent positives fully documented  I have made any additions or clarifications directly to the above note. Agree with note above.  Continue IV heparin until patient undergoes liver biopsy then switch to Eliquis given history of DVT and PE.  Stroke team will sign off.  Kindly call for questions  Eather Popp, MD Medical Director [facility] Pager:  [phone] 02/19/2024 2:32 PM

## 2024-02-20 DIAGNOSIS — I639 Cerebral infarction, unspecified: Secondary | ICD-10-CM | POA: Diagnosis not present

## 2024-02-20 DIAGNOSIS — K8689 Other specified diseases of pancreas: Secondary | ICD-10-CM | POA: Diagnosis not present

## 2024-02-20 DIAGNOSIS — C787 Secondary malignant neoplasm of liver and intrahepatic bile duct: Secondary | ICD-10-CM | POA: Diagnosis not present

## 2024-02-20 DIAGNOSIS — I824Z3 Acute embolism and thrombosis of unspecified deep veins of distal lower extremity, bilateral: Secondary | ICD-10-CM | POA: Diagnosis not present

## 2024-02-20 DIAGNOSIS — R160 Hepatomegaly, not elsewhere classified: Secondary | ICD-10-CM | POA: Diagnosis not present

## 2024-02-20 LAB — CBC
HCT: 39.2 % (ref 39.0–52.0)
Hemoglobin: 12.3 g/dL — ABNORMAL LOW (ref 13.0–17.0)
MCH: 29.1 pg (ref 26.0–34.0)
MCHC: 31.4 g/dL (ref 30.0–36.0)
MCV: 92.9 fL (ref 80.0–100.0)
Platelets: 223 K/uL (ref 150–400)
RBC: 4.22 MIL/uL (ref 4.22–5.81)
RDW: 13.4 % (ref 11.5–15.5)
WBC: 9.6 K/uL (ref 4.0–10.5)
nRBC: 0 % (ref 0.0–0.2)

## 2024-02-20 LAB — BASIC METABOLIC PANEL WITH GFR
Anion gap: 12 (ref 5–15)
BUN: 10 mg/dL (ref 8–23)
CO2: 26 mmol/L (ref 22–32)
Calcium: 8.8 mg/dL — ABNORMAL LOW (ref 8.9–10.3)
Chloride: 101 mmol/L (ref 98–111)
Creatinine, Ser: 0.92 mg/dL (ref 0.61–1.24)
GFR, Estimated: >60 mL/min (ref >60)
Glucose, Bld: 135 mg/dL — ABNORMAL HIGH (ref 70–99)
Potassium: 3.7 mmol/L (ref 3.5–5.1)
Sodium: 139 mmol/L (ref 135–145)

## 2024-02-20 LAB — MRSA NEXT GEN BY PCR, NASAL: MRSA by PCR Next Gen: NOT DETECTED

## 2024-02-20 LAB — HEPARIN LEVEL (UNFRACTIONATED)
Heparin Unfractionated: 0.28 [IU]/mL — ABNORMAL LOW (ref 0.30–0.70)
Heparin Unfractionated: 0.49 [IU]/mL (ref 0.30–0.70)

## 2024-02-20 NOTE — TOC Initial Note (Signed)
 Transition of Care [facility]) - Initial/Assessment Note    Patient Details  Name: Riley Wright MRN: [id number] Date of Birth: Aug 15, 1960  Transition of Care [facility]) CM/SW Contact:    Almarie CHRISTELLA Goodie, LCSW Phone Number: 02/20/2024, 10:39 AM  Clinical Narrative:      CSW following for disposition. Patient will likely need SNF placement at discharge; CIR following but unsure insurance would approve, and unsure about support after. Barriers to SNF include not knowing oncology plan, patient currently under IVC, and with a sitter. CSW discussed with MD about barriers to discharge, will complete referral for SNF and fax out once oncology recommendations are in place after biopsy.  CSW updated yesterday by palliative NP that patient's son is concerned about Medicaid, overwhelmed with how to care for his dad's finances. CSW sent message to financial counselor and confirmed that they will reach out to son [name] to assist with Medicaid workup. CSW to follow.       Expected Discharge Plan: Skilled Nursing Facility Barriers to Discharge: Continued Medical Work up, English As A Second Language Teacher, Facility will not accept until restraint criteria met, Other (must enter comment)   Patient Goals and CMS Choice Patient states their goals for this hospitalization and ongoing recovery are:: patient unable to participate in goal setting, not fully oriented CMS Medicare.gov Compare Post Acute Care list provided to:: Patient Represenative (must comment) Choice offered to / list presented to : Adult Children [place] ownership interest in [facility].provided to:: Adult Children    Expected Discharge Plan and Services     Post Acute Care Choice: Skilled Nursing Facility Living arrangements for the past 2 months: Single Family Home                                      Prior Living Arrangements/Services Living arrangements for the past 2 months: Single Family Home Lives with:: Adult  Children Patient language and need for interpreter reviewed:: No Do you feel safe going back to the place where you live?: Yes      Need for Family Participation in Patient Care: Yes (Comment) Care giver support system in place?: No (comment)   Criminal Activity/Legal Involvement Pertinent to Current Situation/Hospitalization: No - Comment as needed  Activities of Daily Living      Permission Sought/Granted Permission sought to share information with : Facility Medical Sales Representative, Family Supports Permission granted to share information with : Yes, Verbal Permission Granted  Share Information with NAME: [name] Riis  Permission granted to share info w AGENCY: SNF  Permission granted to share info w Relationship: Sons     Emotional Assessment Appearance:: Appears stated age Attitude/Demeanor/Rapport: Unable to Assess Affect (typically observed): Unable to Assess Orientation: : Oriented to Self, Oriented to Place Alcohol / Substance Use: Not Applicable Psych Involvement: No (comment)  Admission diagnosis:  Stroke [facility]) [I63.9] Acute CVA (cerebrovascular accident) [facility]) [I63.9] Patient Active Problem List   Diagnosis Date Noted   Altered mental status 02/18/2024   DVT (deep venous thrombosis) (HCC) 02/16/2024   Metastatic cancer (HCC) 02/16/2024   Elevated alkaline phosphatase level 02/16/2024   Mass of pancreas 02/15/2024   Pulmonary embolus (HCC) 02/15/2024   Acute CVA (cerebrovascular accident) (HCC) 02/14/2024   Elevated ALT measurement 02/14/2024   Elevated AST (SGOT) 02/14/2024   Increased anion gap metabolic acidosis 02/14/2024   Leukocytosis 02/14/2024   Liver masses 02/14/2024   Spinal stenosis, lumbar region, with  neurogenic claudication 01/26/2015   PCP:  Patient, No Pcp Per Pharmacy:   [id number] [address], [place] - [address] AT [facility] & [address] [place] [address] Phone:  [phone] Fax: [phone]     Social Drivers of Health (SDOH) Social History: SDOH Screenings   Food Insecurity: Low Risk  (09/17/2022)   Received from [facility]  Housing: Low Risk  (09/17/2022)   Received from [facility]  Transportation Needs: No Transportation Needs (09/17/2022)   Received from [facility]  Utilities: Low Risk  (09/17/2022)   Received from [facility]  Tobacco Use: Medium Risk (02/14/2024)   SDOH Interventions:     Readmission Risk Interventions     No data to display

## 2024-02-20 NOTE — Progress Notes (Signed)
 IP PROGRESS NOTE  Subjective:   Riley Wright has no complaint.  No family was present when I saw him at approximately 6:15 AM A sitter was at the bedside.  Objective: Vital signs in last 24 hours: Blood pressure 138/82, pulse 77, temperature 98.7 F (37.1 C), temperature source Oral, resp. rate 18, height 6' 2 (1.88 m), weight 207 lb 9.6 oz (94.2 kg), SpO2 98%.  Intake/Output from previous day: 11/05 0701 - 11/06 0700 In: 1406.3 [P.O.:1080; I.V.:326.3] Out: -   Physical Exam: Abdomen: Soft and nontender, no masses, no hepatosplenomegaly Neurologic: Alert, follows commands, moves extremities to command.  Mild right facial droop.  Oriented to city, not current location or diagnosis Lab Results: Recent Labs    02/19/24 0140 02/20/24 0239  WBC 9.5 9.6  HGB 12.6* 12.3*  HCT 39.9 39.2  PLT 212 223    BMET Recent Labs    02/20/24 0239  NA 139  K 3.7  CL 101  CO2 26  GLUCOSE 135*  BUN 10  CREATININE 0.92  CALCIUM 8.8*    Lab Results  Component Value Date   CEA1 182.0 (H) 02/16/2024   RJW800 11 02/16/2024    Studies/Results: EEG adult Result Date: 02/18/2024 [place] Arlin KIDD, MD     02/18/2024 12:40 PM Patient Name: Riley Wright MRN: [id number] Epilepsy Attending: Arlin KIDD Shelton Referring Physician/Provider: Merrianne Locus, MD Date: 02/18/2024 Duration: 23.20 mins Patient history: 63 y.o. with a pertinent no known PMH, who presented with receptive and expressive aphasia. EEG to evaluate for seizure Level of alertness: Awake, asleep AEDs during EEG study: None Technical aspects: This EEG study was done with scalp electrodes positioned according to the 10-20 International system of electrode placement. Electrical activity was reviewed with band pass filter of 1-70Hz , sensitivity of 7 uV/mm, display speed of 71mm/sec with a 60Hz  notched filter applied as appropriate. EEG data were recorded continuously and digitally stored.  Video monitoring was available and reviewed as  appropriate. Description: The posterior dominant rhythm consists of 8Hz  activity of moderate voltage (25-35 uV) seen predominantly in posterior head regions, symmetric and reactive to eye opening and eye closing. Sleep was characterized by vertex waves, sleep spindles (12 to 14 Hz), maximal frontocentral region. There is intermittent generalized 3 to 6 Hz theta-delta slowing. Hyperventilation and photic stimulation were not performed.   ABNORMALITY - Intermittent slow, generalized IMPRESSION: This study is suggestive of mild generalized cerebral dysfunction (encephalopathy). No seizures or epileptiform discharges were seen throughout the recording. Arlin KIDD Shelton   CT HEAD CODE STROKE WO CONTRAST Result Date: 02/18/2024 EXAM: CT HEAD WITHOUT CONTRAST 02/18/2024 10:14:16 AM TECHNIQUE: CT of the head was performed without the administration of intravenous contrast. Automated exposure control, iterative reconstruction, and/or weight based adjustment of the mA/kV was utilized to reduce the radiation dose to as low as reasonably achievable. COMPARISON: PET CT 02/15/2024 and earlier. Numerous embolic appearing infarcts on MRI last month. CLINICAL HISTORY: 63 year old male. Neuro deficit, acute, stroke suspected. Numerous embolic appearing infarcts on MRI last month. FINDINGS: BRAIN AND VENTRICLES: No acute intracranial hemorrhage. No mass effect or midline shift. Left thalamic infarct redemonstrated. Small left cerebellar infarct also now apparent by CT. Other small bilateral infarcts less apparent. No new site of toxic edema identified. No suspicious intracranial vascular hyperdensity. No hydrocephalus. No extra-axial collection. ORBITS: No acute abnormality. SINUSES: No acute abnormality. SOFT TISSUES AND SKULL: No acute soft tissue abnormality. No skull fracture. alberta stroke program early CT score (aspects) ----- Ganglionic (caudate, ic,  lentiform nucleus, insula, M1-m3): 7 Supraganglionic (m4-m6): 3 Total: 10  IMPRESSION: 1. Numerous small recent infarcts, the largest visible by CT includes the left thalamus. No hemorrhagic transformation or mass effect. 2. No new intracranial abnormality. ASPECTS 10. 3. These results were communicated to Dr. Lindzen at 10:22 hours on 02/18/2024 by text page via the [facility] messaging system. Electronically signed by: Helayne Hurst MD 02/18/2024 10:24 AM EST RP Workstation: [id number]    Medications: I have reviewed the patient's current medications.  Assessment/Plan: Pancreas mass, liver/lung lesions 02/14/2024 CTs: 4.9 x 2.4 cm hypodense mass in the pancreas body, innumerable hypodense liver metastases, innumerable subcentimeter bilateral pulmonary nodules, peripheral wedge-shaped hypodensities in the spleen 2.   Pulmonary embolism on CT chest 02/14/2024, no right heart strain 02/15/2024 lower extremity Dopplers: Acute DVT at the right posterior tibial and peroneal veins, left posterior tibial and peroneal veins 3.   Multivessel distribution CVAs 02/14/2024 CT head: Diminished attenuation in the left thalamus concerning for a subacute infarct, focal area of subcortical infarct in the left posterior temporal lobe, multiple areas of age-indeterminate infarction within cerebellar hemispheres-worse on the left 02/14/2024 MRI brain: Numerous acute ischemic foci in both cerebellar and cerebral hemispheres consistent with emboli, multifocal cerebral T2 hyperintensities consistent with chronic small vessel disease 02/15/2024 MRI brain with contrast: Numerous nodular and linear foci of contrast enhancement, numerous areas of ischemia with no contrast-enhancement 4.  Elevated liver enzymes and bilirubin secondary to #1 5.  Altered mental status/partial aphasia secondary to #2   Riley Wright appeared unchanged when I saw him at approximately 6:15 AM. He is scheduled for biopsy a liver lesion on 02/21/2024.  The liver biopsy was delayed due to aspirin given over the weekend.  The clinical  presentation is consistent with metastatic pancreas cancer.  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  He also has bilateral lower extremity DVTs and a pulmonary embolism.  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   Riley Wright has a poor prognosis.  He will not be a candidate for treatment of the malignancy unless his mental status improves.  I discussed the apparent diagnosis of metastatic pancreas cancer, the prognosis, and treatment options with his son, Riley Wright, by telephone at approximately 8 AM.  We reviewed Mr. Franzen clinical presentation and current status.  Riley Wright understands Riley Wright poor prognosis.  I explained the expected survival of 1-2 months if a diagnosis of metastatic pancreas cancer is confirmed and he is unable to receive systemic therapy.  The prognosis is especially poor in patients with a hypercoagulation syndrome including Wright endocarditis.  Aeneas Longsworth indicates his father cannot be cared for in the home.  He will need placement following discharge from the hospital.    Recommendations: Continue supportive care, poststroke care per the medical and neurology services Heparin anticoagulation until he undergoes the liver biopsy procedure, then convert to apixaban or Lovenox Ultrasound-guided biopsy of a liver lesion I will be out until 02/24/2024.  I will check on him then.  Please call oncology as needed.  LOS: 6 days   Arley Hof, MD   02/20/2024, 8:04 AM

## 2024-02-20 NOTE — Progress Notes (Signed)
 PHARMACY - ANTICOAGULATION CONSULT NOTE  Pharmacy Consult for heparin  Indication: PE, splenic infarct, brain lesions  No Known Allergies  Patient Measurements: Height: 6' 2 (188 cm) Weight: 94.2 kg (207 lb 9.6 oz) IBW/kg (Calculated) : 82.2 HEPARIN DW (KG): 94.2  Vital Signs: Temp: 98.5 F (36.9 C) (11/06 1624) Temp Source: Oral (11/06 1624) BP: 112/84 (11/06 1624) Pulse Rate: 69 (11/06 1624)  Labs: Recent Labs    02/18/24 0259 02/19/24 0140 02/20/24 0239 02/20/24 1603  HGB 12.4* 12.6* 12.3*  --   HCT 38.8* 39.9 39.2  --   PLT 195 212 223  --   HEPARINUNFRC 0.36 0.44 0.28* 0.49  CREATININE  --   --  0.92  --     Estimated Creatinine Clearance: 96.8 mL/min (by C-G formula based on SCr of 0.92 mg/dL).   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 No anticoagulation prior to admission. Pharmacy consulted for heparin.    Heparin level 0.28 is subtherapeutic on 1750 units/hr. Hgb, PLT WNL. No bleeding or pauses noted.   PM: heparin level 0.49 is at upper end of goal range on 1850 units/hr. No issues with the infusion or bleeding reported.  Goal of Therapy:  Heparin level: 0.3-0.5 Monitor platelets by anticoagulation protocol: Yes   Plan:  Decrease heparin infusion to 1800 units/hr Recheck heparin level in 6hrs Monitor daily heparin level, CBC, signs/symptoms of bleeding  Hold heparin at 0500 tomorrow morning, 11/7, for liver biopsy In contact with IR to determine when to start apixaban post-procedure based on bleed risk  Rocky Melody, PharmD, BCPS 02/20/2024 4:40 PM  Please check AMION for all [facility] Pharmacy phone numbers After 10:00 PM, call Main Pharmacy [phone]

## 2024-02-20 NOTE — Progress Notes (Signed)
 PT Cancellation Note  Patient Details Name: Riley Wright MRN: [id number] DOB: 1961/01/10   Cancelled Treatment:    Reason Eval/Treat Not Completed: Fatigue/lethargy limiting ability to participate. Pt declining citing need to rest.    [name] 02/20/2024, 12:00 PM

## 2024-02-20 NOTE — Plan of Care (Signed)
 More talkative and interactive today.  Sleeping less and engaging.  Son at his side.  Remains with IVC sitter.    Problem: Education: Goal: Knowledge of disease or condition will improve Outcome: Progressing   Problem: Education: Goal: Knowledge of secondary prevention will improve (MUST DOCUMENT ALL) Outcome: Progressing   Problem: Ischemic Stroke/TIA Tissue Perfusion: Goal: Complications of ischemic stroke/TIA will be minimized Outcome: Progressing   Problem: Nutrition: Goal: Risk of aspiration will decrease Outcome: Progressing   Problem: Activity: Goal: Risk for activity intolerance will decrease Outcome: Progressing

## 2024-02-20 NOTE — Progress Notes (Signed)
 Subjective: Riley Wright is a 63 y.o. 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 Pt was placed under IVC on 11/2.   Interval update: 11/6 There were no acute events overnight. Vital signs have been stable, and morning labs are non-concerning. No change from palliative, and Neurology has signed off. On interview, pt denies fever, SOB, chest pain, abdominal pain, N/V/D. Says his last bowel movement was 2 months from now. Sitter says there have been no agitation concerns. Discharge planning cannot progress further without the liver biopsies per SW.  Objective:  Vital signs in last 24 hours: Vitals:   02/19/24 1528 02/19/24 2040 02/19/24 2300 02/20/24 0404  BP: 122/73 115/73 116/81 138/82  Pulse: 80 73 65 77  Resp: 20 18  18   Temp: 98.3 F (36.8 C) 99.2 F (37.3 C) 99.4 F (37.4 C) 98.7 F (37.1 C)  TempSrc: Oral Oral Oral Oral  SpO2: 98% 98% 98% 98%  Weight:      Height:        Physical Exam Vitals and nursing note reviewed. Exam conducted with a chaperone present.  Constitutional:      General: He is not in acute distress.    Appearance: He is not toxic-appearing.  Cardiovascular:     Rate and Rhythm: Normal rate and regular rhythm.     Pulses: Normal pulses.     Heart sounds: No murmur heard. Pulmonary:     Effort: Pulmonary effort is normal. No respiratory distress.     Breath sounds: Normal breath sounds.  Musculoskeletal:     Right lower leg: No edema.     Left lower leg: No edema.  Neurological:     Sensory: Sensation is intact.     Motor: Motor function is intact.     Coordination: Coordination is intact.     Comments: Is unsure why he is in the hospital, unsure of the year, oriented to self only.   Expressive Aphasia: Conversationally impaired.      Latest Ref Rng & Units 02/20/2024     2:39 AM 02/19/2024    1:40 AM 02/18/2024    2:59 AM  CBC  WBC 4.0 - 10.5 K/uL 9.6  9.5  9.1   Hemoglobin 13.0 - 17.0 g/dL 87.6  87.3  87.5   Hematocrit 39.0 - 52.0 % 39.2  39.9  38.8   Platelets 150 - 400 K/uL 223  212  195         Latest Ref Rng & Units 02/20/2024    2:39 AM 02/17/2024    2:27 AM 02/16/2024    3:27 AM  BMP  Glucose 70 - 99 mg/dL 864  88  90   BUN 8 - 23 mg/dL 10  12  12    Creatinine 0.61 - 1.24 mg/dL 9.07  8.96  8.98   Sodium 135 - 145 mmol/L 139  137  136   Potassium 3.5 - 5.1 mmol/L 3.7  3.7  3.9   Chloride 98 - 111 mmol/L 101  98  98   CO2 22 - 32 mmol/L 26  24  25    Calcium 8.9 - 10.3 mg/dL 8.8  8.5  8.9    Assessment/Plan:  Principal Problem:   Metastatic cancer (HCC) Active Problems:   Acute CVA (cerebrovascular accident) (HCC)   Elevated ALT measurement   Elevated AST (SGOT)  Leukocytosis   Liver masses   Mass of pancreas   Pulmonary embolus (HCC)   DVT (deep venous thrombosis) (HCC)   Elevated alkaline phosphatase level   Altered mental status  #AMS Pt required code stroke for temporary altered mental status of GCS 4 and somnolence. Stat CT negative.  - Neurology recs   - EEG complete, results pending  #Metastatic cancer, suspected pancreatic as primary source #Elevated AST, ALT, Alkaline phosphatase  #Metastatic masses in the liver  Patient presented as a suspected acute stroke with elevated AST and ALT. RUQ US  demonstrated multiple solid appearing hepatic masses and a CT of the chest/ abd/pelvis showed 4.9 x 2.4 cm hypodense mass within the pancreatic body, highly concerning for pancreatic neoplasm with metastasis to the lung and liver. IR was consulted with plans to proceed with a biopsy of the liver mass on Friday for confirmation. Plan: -Liver biopsy 11/7 with IR  - Scheduled for Friday d/t 5 day Aspirin washout period -Continue to monitor CMP, CBC, INR -Follow up CEA, AFP, CA 19-9 levels -Appreciate oncology recommendations  -  Awaiting biopsy results, scheduled for Friday -Palliative care consulted  - Family deciding GOC  - Pt not a hospice candidate - No documented bowel movement since 10/31  - Senna and Miralax  BID, decrease frequency after we have a significant bowel movement.  #Metastatic lesions of the brain vs acute embolic CVAs  Patient initially thought to have embolic CVAs in the brain. Discussed case with neurology yesterday who favors metastatic brain lesions given the enhancement with contrast, over embolic strokes.  Plan: -Echocardiogram and Transcranial doppler w/ bubble: negative for PFO -TTE negative for valvular vegetations - TEE not necessary as patient is already on heparin for DVT treatment -Appreciate neurology recommendations  -D/cd ASA and plavix   #PE #DVT  Patient was found to have an incidental PE on chest CT and also found to have bilateral acute deep vein thrombosis involving the left and right posterior tibial veins, and left and right peroneal veins. High suspicion for trousseau syndrome. He was started on a heparin drip yesterday. Patient remains HDS on RA.  Plan: -Continue heparin drip -Will bridge off heparin drip once liver biopsy has been completed  #Leukocytosis: Stable and likely reactive   Resolved Problems:  #AGMA __________________________________  Code Status: Full VTE Prophylaxis:Heparin drip Diet:Regular  IVF:N/A Barriers to Discharge: Liver biopsy on Friday. Will need oncology treatment recs prior to selecting/applying for appropriate discharge location. Will likely be here past Tuesday unless patient changes to hospice care. Dispo: Anticipated discharge next week to unknown location.  [name], DO 02/20/2024, 6:54 AM Please contact the on call pager at: [phone]

## 2024-02-20 NOTE — Progress Notes (Signed)
 PHARMACY - ANTICOAGULATION CONSULT NOTE  Pharmacy Consult for heparin  Indication: PE, splenic infarct, brain lesions  No Known Allergies  Patient Measurements: Height: 6' 2 (188 cm) Weight: 94.2 kg (207 lb 9.6 oz) IBW/kg (Calculated) : 82.2 HEPARIN DW (KG): 94.2  Vital Signs: Temp: 98.7 F (37.1 C) (11/06 0404) Temp Source: Oral (11/06 0404) BP: 138/82 (11/06 0404) Pulse Rate: 77 (11/06 0404)  Labs: Recent Labs    02/18/24 0259 02/19/24 0140 02/20/24 0239  HGB 12.4* 12.6* 12.3*  HCT 38.8* 39.9 39.2  PLT 195 212 223  HEPARINUNFRC 0.36 0.44 0.28*  CREATININE  --   --  0.92    Estimated Creatinine Clearance: 96.8 mL/min (by C-G formula based on SCr of 0.92 mg/dL).   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 No anticoagulation prior to admission. Pharmacy consulted for heparin.    Heparin level 0.28 is subtherapeutic on 1750 units/hr. Hgb, PLT WNL. No bleeding or pauses noted.   Goal of Therapy:  Heparin level: 0.3-0.5 Monitor platelets by anticoagulation protocol: Yes   Plan:  Increase heparin infusion to 1850 units/hr Monitor daily heparin level, CBC, signs/symptoms of bleeding  Hold heparin at 0500 tomorrow morning, 11/7, for liver biopsy In contact with IR to determine when to start apixaban post-procedure based on bleed risk  Izetta Carl, PharmD PGY1 Pharmacy Resident

## 2024-02-21 ENCOUNTER — Inpatient Hospital Stay (HOSPITAL_COMMUNITY)

## 2024-02-21 DIAGNOSIS — C787 Secondary malignant neoplasm of liver and intrahepatic bile duct: Secondary | ICD-10-CM | POA: Diagnosis not present

## 2024-02-21 LAB — COMPREHENSIVE METABOLIC PANEL WITH GFR
ALT: 99 U/L — ABNORMAL HIGH (ref 0–44)
AST: 77 U/L — ABNORMAL HIGH (ref 15–41)
Albumin: 2.8 g/dL — ABNORMAL LOW (ref 3.5–5.0)
Alkaline Phosphatase: 160 U/L — ABNORMAL HIGH (ref 38–126)
Anion gap: 11 (ref 5–15)
BUN: 10 mg/dL (ref 8–23)
CO2: 26 mmol/L (ref 22–32)
Calcium: 9 mg/dL (ref 8.9–10.3)
Chloride: 102 mmol/L (ref 98–111)
Creatinine, Ser: 0.99 mg/dL (ref 0.61–1.24)
GFR, Estimated: >60 mL/min (ref >60)
Glucose, Bld: 108 mg/dL — ABNORMAL HIGH (ref 70–99)
Potassium: 4.1 mmol/L (ref 3.5–5.1)
Sodium: 139 mmol/L (ref 135–145)
Total Bilirubin: 0.9 mg/dL (ref 0.0–1.2)
Total Protein: 6.4 g/dL — ABNORMAL LOW (ref 6.5–8.1)

## 2024-02-21 LAB — CBC
HCT: 37.7 % — ABNORMAL LOW (ref 39.0–52.0)
Hemoglobin: 12.1 g/dL — ABNORMAL LOW (ref 13.0–17.0)
MCH: 29.5 pg (ref 26.0–34.0)
MCHC: 32.1 g/dL (ref 30.0–36.0)
MCV: 92 fL (ref 80.0–100.0)
Platelets: 230 K/uL (ref 150–400)
RBC: 4.1 MIL/uL — ABNORMAL LOW (ref 4.22–5.81)
RDW: 13.6 % (ref 11.5–15.5)
WBC: 10.5 K/uL (ref 4.0–10.5)
nRBC: 0 % (ref 0.0–0.2)

## 2024-02-21 LAB — PROTIME-INR
INR: 1.1 (ref 0.8–1.2)
Prothrombin Time: 14.3 s (ref 11.4–15.2)

## 2024-02-21 LAB — HEPARIN LEVEL (UNFRACTIONATED)
Heparin Unfractionated: 0.31 [IU]/mL (ref 0.30–0.70)
Heparin Unfractionated: 0.41 [IU]/mL (ref 0.30–0.70)
Heparin Unfractionated: 0.45 [IU]/mL (ref 0.30–0.70)

## 2024-02-21 MED ORDER — LIDOCAINE HCL (PF) 1 % IJ SOLN
10.0000 mL | Freq: Once | INTRAMUSCULAR | Status: AC
  Administered 2024-02-21: 10 mL

## 2024-02-21 MED ORDER — MIDAZOLAM HCL (PF) 2 MG/2ML IJ SOLN
INTRAMUSCULAR | Status: AC | PRN
  Administered 2024-02-21: 1 mg via INTRAVENOUS
  Administered 2024-02-21: .5 mg via INTRAVENOUS
  Administered 2024-02-21: 1 mg via INTRAVENOUS

## 2024-02-21 MED ORDER — FENTANYL CITRATE (PF) 100 MCG/2ML IJ SOLN
INTRAMUSCULAR | Status: AC | PRN
  Administered 2024-02-21 (×2): 50 ug via INTRAVENOUS

## 2024-02-21 MED ORDER — FENTANYL CITRATE (PF) 100 MCG/2ML IJ SOLN
INTRAMUSCULAR | Status: AC
  Filled 2024-02-21: qty 4

## 2024-02-21 MED ORDER — APIXABAN 5 MG PO TABS
10.0000 mg | ORAL_TABLET | Freq: Two times a day (BID) | ORAL | Status: DC
  Administered 2024-02-23 (×2): 10 mg via ORAL
  Filled 2024-02-21 (×2): qty 2

## 2024-02-21 MED ORDER — MIDAZOLAM HCL 2 MG/2ML IJ SOLN
INTRAMUSCULAR | Status: AC
Start: 2024-02-21 — End: 2024-02-21
  Filled 2024-02-21: qty 4

## 2024-02-21 MED ORDER — HEPARIN (PORCINE) 25000 UT/250ML-% IV SOLN
1800.0000 [IU]/h | INTRAVENOUS | Status: DC
  Administered 2024-02-21 – 2024-02-23 (×3): 1800 [IU]/h via INTRAVENOUS
  Filled 2024-02-21 (×3): qty 250

## 2024-02-21 MED ORDER — GELATIN ABSORBABLE 12-7 MM EX MISC
1.0000 | Freq: Once | CUTANEOUS | Status: AC
  Administered 2024-02-21: 1 via TOPICAL
  Filled 2024-02-21: qty 1

## 2024-02-21 MED ORDER — APIXABAN 5 MG PO TABS: 5.0000 mg | ORAL_TABLET | Freq: Two times a day (BID) | ORAL | Status: DC

## 2024-02-21 NOTE — Progress Notes (Signed)
 PHARMACY - ANTICOAGULATION CONSULT NOTE  Pharmacy Consult for heparin Indication: PE and splenic infarct in setting of brain lesions  Labs: Recent Labs    02/18/24 0259 02/19/24 0140 02/20/24 0239 02/20/24 1603 02/20/24 2335  HGB 12.4* 12.6* 12.3*  --   --   HCT 38.8* 39.9 39.2  --   --   PLT 195 212 223  --   --   HEPARINUNFRC 0.36 0.44 0.28* 0.49 0.45  CREATININE  --   --  0.92  --   --    Assessment/Plan:  63yo male therapeutic on heparin after rate change. Will continue infusion at current rate of 1800 units/hr and confirm stable with am labs.  Plan to hold heparin in am for liver Bx.  Marvetta Dauphin, PharmD, BCPS 02/21/2024 12:26 AM

## 2024-02-21 NOTE — Plan of Care (Signed)
  Problem: Coping: Goal: Will verbalize positive feelings about self Outcome: Not Progressing Goal: Will identify appropriate support needs Outcome: Not Progressing   Problem: Nutrition: Goal: Risk of aspiration will decrease Outcome: Progressing Goal: Dietary intake will improve Outcome: Progressing   Problem: Clinical Measurements: Goal: Ability to maintain clinical measurements within normal limits will improve Outcome: Progressing Goal: Will remain free from infection Outcome: Progressing Goal: Diagnostic test results will improve Outcome: Progressing Goal: Respiratory complications will improve Outcome: Progressing Goal: Cardiovascular complication will be avoided Outcome: Progressing

## 2024-02-21 NOTE — TOC Progression Note (Addendum)
 Transition of Care [facility]) - Progression Note    Patient Details  Name: Riley Wright MRN: [id number] Date of Birth: [birth year]  Transition of Care [facility] CM/SW Contact  Riley Wright, Riley Wright Phone Number: 02/21/2024, 10:03 AM  Clinical Narrative:   CSW following for disposition. Patient's biopsy occurred this morning, awaiting recommendations. Patient continues with IVC and sitter. Will monitor progress over the weekend.    UPDATE: CSW contacted by Financial Counseling that son, Riley Wright, was contacted to discuss Medicaid application and was asking for information on completing POA. CSW met with Riley Wright at bedside and provided copies of the paperwork for guardianship, explained that patient is not able to provide consent at this time and therefore guardianship would need to be obtained. CSW explained forms and that they need to be filed with clerk of court. Riley Wright indicated that his brother, Riley Wright, would be the one taking that on. CSW ensured sons had CSW phone number to contact if they had other questions. CSW to follow.    Expected Discharge Plan: Skilled Nursing Facility Barriers to Discharge: Continued Medical Work up, English As A Second Language Teacher, Facility will not accept until restraint criteria met, Other (must enter comment)               Expected Discharge Plan and Services     Post Acute Care Choice: Skilled Nursing Facility Living arrangements for the past 2 months: Single Family Home                                       Social Drivers of Health (SDOH) Interventions SDOH Screenings   Food Insecurity: Low Risk  (09/17/2022)   Received from [facility]  Housing: Low Risk  (09/17/2022)   Received from [facility]  Transportation Needs: No Transportation Needs (09/17/2022)   Received from [facility]  Utilities: Low Risk  (09/17/2022)   Received from [facility]  Tobacco Use: Medium Risk (02/14/2024)    Readmission Risk Interventions     No data to  display

## 2024-02-21 NOTE — Progress Notes (Signed)
 PHARMACY - ANTICOAGULATION CONSULT NOTE  Pharmacy Consult for heparin  Indication: PE, splenic infarct  No Known Allergies  Patient Measurements: Height: 6' 2 (188 cm) Weight: 94.2 kg (207 lb 9.6 oz) IBW/kg (Calculated) : 82.2 HEPARIN DW (KG): 94.2  Vital Signs: Temp: 98.1 F (36.7 C) (11/07 0415) Temp Source: Oral (11/07 0415) BP: 134/84 (11/07 0851) Pulse Rate: 76 (11/07 0851)  Labs: Recent Labs    02/19/24 0140 02/20/24 0239 02/20/24 1603 02/20/24 2335 02/21/24 0519  HGB 12.6* 12.3*  --   --  12.1*  HCT 39.9 39.2  --   --  37.7*  PLT 212 223  --   --  230  LABPROT  --   --   --   --  14.3  INR  --   --   --   --  1.1  HEPARINUNFRC 0.44 0.28* 0.49 0.45 0.41  CREATININE  --  0.92  --   --  0.99    Estimated Creatinine Clearance: 89.9 mL/min (by C-G formula based on SCr of 0.99 mg/dL).   Medical History: Past Medical History:  Diagnosis Date   Arthritis    bil hips   Heart murmur       Assessment: 63 yo male with incidental L sided PE without right heart strain, BL LE DVTs and  CVA with new metastatic pancreatic cancer. No anticoagulation prior to admission. Pharmacy consulted for heparin.    11/7 AM: heparin level therapeutic at 0.41 on 1800 units/hr. Heparin was paused at 0500 this AM for liver biopsy. Will restart heparin infusion at 1200 s/p procedure. Hgb, PLT WNL. Will continue same heparin rate, but as liver biopsy is a high risk bleed procedure, will monitor closely for bleeding.   Goal of Therapy:  Heparin level: 0.3-0.5 Monitor platelets by anticoagulation protocol: Yes   Plan:  Restart heparin at 1800 units/hr Recheck heparin level in 6hrs Monitor CBC, signs/symptoms of bleeding  Will switch to apixaban on Sunday, 11/9 AM (48 hours post-procedure) due to high bleeding risk  Izetta Carl, PharmD PGY1 Pharmacy Resident

## 2024-02-21 NOTE — Progress Notes (Signed)
 SLP Cancellation Note  Patient Details Name: Riley Wright MRN: [id number] DOB: Jul 21, 1960   Cancelled treatment:        In biopsy- has met freq for the week   Dustin Olam Bull 02/21/2024, 11:47 AM

## 2024-02-21 NOTE — Progress Notes (Signed)
 PT Cancellation Note  Patient Details Name: AVION KUTZER MRN: [id number] DOB: 1960/12/03   Cancelled Treatment:    Reason Eval/Treat Not Completed: (P) Patient at procedure or test/unavailable (Pt off floor at biopsy when attempted then on bedrest for 3 hrs once returned per RN. Will follow up as able.)   Darryle George 02/21/2024, 3:55 PM

## 2024-02-21 NOTE — Progress Notes (Signed)
 Daily Progress Note   Patient Name: Riley Wright       Date: 02/21/2024 DOB: 08-Feb-1961  Age: 63 y.o. MRN#: [id number] Attending Physician: Riley Reyes BROCKS, MD Primary Care Physician: Patient, No Pcp Per Admit Date: 02/14/2024  Reason for Consultation/Follow-up: Establishing goals of care    Length of Stay: 7  Current Medications: Scheduled Meds:   [START Wright 02/23/2024] apixaban  10 mg Oral BID   Followed by   Riley Wright 03/01/2024] apixaban  5 mg Oral BID   feeding supplement  237 mL Oral BID BM   multivitamin with minerals  1 tablet Oral Daily   polyethylene glycol  17 g Oral BID   senna-docusate  2 tablet Oral QHS    Continuous Infusions:  heparin 1,800 Units/hr (02/21/24 1212)    PRN Meds: mouth rinse  Physical Exam Vitals reviewed.  Constitutional:      General: He is not in acute distress. Cardiovascular:     Rate and Rhythm: Normal rate.  Pulmonary:     Effort: Pulmonary effort is normal.  Skin:    General: Skin is dry.  Neurological:     Mental Status: He is confused.             Vital Signs: BP (!) 133/90 (BP Location: Left Arm)   Pulse 79   Temp 98.6 F (37 C) (Oral)   Resp 19   Ht 6' 2 (1.88 m)   Wt 94.2 kg   SpO2 97%   BMI 26.65 kg/m  SpO2: SpO2: 97 % O2 Device: O2 Device: Room Air O2 Flow Rate: O2 Flow Rate (L/min): 2 L/min    Patient Active Problem List   Diagnosis Date Noted   Altered mental status 02/18/2024   DVT (deep venous thrombosis) (HCC) 02/16/2024   Metastatic cancer (HCC) 02/16/2024   Elevated alkaline phosphatase level 02/16/2024   Mass of pancreas 02/15/2024   Pulmonary embolus (HCC) 02/15/2024   Acute CVA (cerebrovascular accident) (HCC) 02/14/2024   Elevated ALT measurement 02/14/2024   Elevated AST (SGOT)  02/14/2024   Increased anion gap metabolic acidosis 02/14/2024   Leukocytosis 02/14/2024   Liver masses 02/14/2024   Spinal stenosis, lumbar region, with neurogenic claudication 01/26/2015    Palliative Care Assessment & Plan   Patient Profile: 63 y.o. male with past medical history  of lumbar decompression admitted Wright 02/14/2024 with altered speech and behavior and poor appetite. 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 Also with concern for suspected pancreatic cancer with mets to liver.   Today's Discussion: Chart reviewed and update received from attending team. Patient had liver biopsy this morning. Patient sitting in bed in NAD watching television. Sitter at bedside. Patient states he is doing pretty good. We reviewed he had the biopsy this morning. When I asked if his sons had visited his response did not make sense.  Spoke to patient's sons Riley Wright and Riley Wright by phone. Riley Wright visited his father earlier today and his father was about the same as he was during his other visits. SW provided them with the paperwork needed for them to apply for guardianship of the patient's. The patient's sons are waiting for the liver biopsy results before making any goals of care decisions.   Emotional support provided. Encouraged sons to call PMT with questions or concerns. PMT will continue to follow/support.  Recommendations/Plan: Full code and scope of care Family awaiting liver biopsy results Encouraged family to continue discussions re: goals of care Continued PMT support   Code Status:    Code Status Orders  (From admission, onward)           Start     Ordered   02/14/24 1509  Full code  Continuous       Question:  By:  Answer:  Consent: discussion documented in EHR   02/14/24 1511         Extensive chart review has been completed prior to seeing the patient including labs, vital signs, imaging, progress/consult notes, orders, medications, and  available advance directive documents.  Care plan was discussed with attending providers  Time spent: 35 minutes  Thank you for allowing the Palliative Medicine Team to assist in the care of this patient.   Riley CHRISTELLA Palin, NP  Please contact Palliative Medicine Team phone at [phone] for questions and concerns.

## 2024-02-21 NOTE — Procedures (Signed)
 Interventional Radiology Procedure:   Indications: Pancreatic lesion and liver lesions, needs tissue diagnosis  Procedure: US  guided liver lesion biopsy  Findings: 3 core biopsies from a right hepatic lesion.  Gelfoam slurry injected along biopsy tract.   Complications: None     EBL: Minimal   Plan:  Bedrest 3 hours.   Ione Sandusky R. Philip, MD  Pager: [phone]

## 2024-02-21 NOTE — Progress Notes (Signed)
 Subjective: Riley Wright is a 63 y.o. 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 Pt was placed under IVC on 11/2.   Interval update: 11/7: There were no acute events overnight. Vital signs have been stable, and morning labs are non-concerning. Plan is for liver biopsy today.  Spoke with son Riley Wright, he said that his family has dicussed palliative care, and they want to wait until later this evening for a more final answer, they want to know more from the biopsy. Son has not noticed any change in mental status in patient since admission. Pt still confused, no reports of aggression.  Objective:  Vital signs in last 24 hours: Vitals:   02/21/24 0830 02/21/24 0835 02/21/24 0840 02/21/24 0851  BP: 122/89 (!) 130/92 117/89 134/84  Pulse: 74 76 79 76  Resp: 11 14 14 18   Temp:      TempSrc:      SpO2: 96% 97% 97% 95%  Weight:      Height:        Physical Exam Vitals and nursing note reviewed. Exam conducted with a chaperone present.  Constitutional:      General: He is not in acute distress.    Appearance: He is normal weight.  Pulmonary:     Effort: Pulmonary effort is normal. No respiratory distress.  Skin:    General: Skin is warm and dry.  Neurological:     Mental Status: He is alert.  Psychiatric:        Cognition and Memory: Cognition is impaired. Memory is impaired.   Expressive Aphasia: Conversationally impaired.      Latest Ref Rng & Units 02/21/2024    5:19 AM 02/20/2024    2:39 AM 02/19/2024    1:40 AM  CBC  WBC 4.0 - 10.5 K/uL 10.5  9.6  9.5   Hemoglobin 13.0 - 17.0 g/dL 87.8  87.6  87.3   Hematocrit 39.0 - 52.0 % 37.7  39.2  39.9   Platelets 150 - 400 K/uL 230  223  212         Latest Ref Rng & Units 02/21/2024    5:19 AM 02/20/2024    2:39 AM 02/17/2024    2:27 AM  BMP  Glucose 70 -  99 mg/dL 891  864  88   BUN 8 - 23 mg/dL 10  10  12    Creatinine 0.61 - 1.24 mg/dL 9.00  9.07  8.96   Sodium 135 - 145 mmol/L 139  139  137   Potassium 3.5 - 5.1 mmol/L 4.1  3.7  3.7   Chloride 98 - 111 mmol/L 102  101  98   CO2 22 - 32 mmol/L 26  26  24    Calcium 8.9 - 10.3 mg/dL 9.0  8.8  8.5    Assessment/Plan:  Principal Problem:   Metastatic cancer (HCC) Active Problems:   Acute CVA (cerebrovascular accident) (HCC)   Elevated ALT measurement   Elevated AST (SGOT)   Leukocytosis   Liver masses   Mass of pancreas   Pulmonary embolus (HCC)   DVT (deep venous thrombosis) (HCC)   Elevated alkaline phosphatase level   Altered mental status  #AMS Pt required code stroke for temporary altered mental status of GCS 4 and somnolence. Stat CT negative.  - Neurology recs   - EEG complete, results  pending  #Metastatic cancer, suspected pancreatic as primary source #Elevated AST, ALT, Alkaline phosphatase  #Metastatic masses in the liver  Patient presented as a suspected acute stroke with elevated AST and ALT. RUQ US  demonstrated multiple solid appearing hepatic masses and a CT of the chest/ abd/pelvis showed 4.9 x 2.4 cm hypodense mass within the pancreatic body, highly concerning for pancreatic neoplasm with metastasis to the lung and liver. IR was consulted with plans to proceed with a biopsy of the liver mass on Friday for confirmation. CEA was positive, AFP and CA19-9 were negative. Plan: -Liver biopsy 11/7 with IR  - Scheduled for Friday d/t 5 day Aspirin washout period -Continue to monitor CMP, CBC, INR -Appreciate oncology recommendations  - Awaiting biopsy results, scheduled for Friday -Palliative care consulted  - Family deciding GOC  - Pt not a hospice candidate  - Pt likely not a treatment candidate - No documented bowel movement since 10/31  - Senna and Miralax  BID, decrease frequency after we have a significant bowel movement.  #Metastatic lesions of the brain vs  acute embolic CVAs  Patient initially thought to have embolic CVAs in the brain. Discussed case with neurology yesterday who favors metastatic brain lesions given the enhancement with contrast, over embolic strokes.  Plan: -Echocardiogram and Transcranial doppler w/ bubble: negative for PFO -TTE negative for valvular vegetations - TEE not necessary as patient is already on heparin for DVT treatment -Appreciate neurology recommendations  -D/cd ASA and plavix   #PE #DVT  Patient was found to have an incidental PE on chest CT and also found to have bilateral acute deep vein thrombosis involving the left and right posterior tibial veins, and left and right peroneal veins. High suspicion for trousseau syndrome. He was started on a heparin drip yesterday. Patient remains HDS on RA.  Plan: -Continue heparin drip -Will bridge off heparin drip once liver biopsy has been completed  #Leukocytosis: Stable and likely reactive   Resolved Problems:  #AGMA __________________________________  Code Status: Full VTE Prophylaxis:Heparin drip Diet:Regular  IVF:N/A Barriers to Discharge: Liver biopsy on Friday. Will need oncology treatment recs prior to selecting/applying for appropriate discharge location. Will likely be here past Tuesday unless patient changes to hospice care. Dispo: Anticipated discharge next week to unknown location.  [name], DO 02/21/2024, 10:10 AM Please contact the on call pager at: [phone]

## 2024-02-21 NOTE — Progress Notes (Signed)
 PHARMACY - ANTICOAGULATION CONSULT NOTE  Pharmacy Consult for heparin  Indication: PE, splenic infarct  No Known Allergies  Patient Measurements: Height: 6' 2 (188 cm) Weight: 94.2 kg (207 lb 9.6 oz) IBW/kg (Calculated) : 82.2 HEPARIN DW (KG): 94.2  Vital Signs: Temp: 99 F (37.2 C) (11/07 1950) Temp Source: Oral (11/07 1950) BP: 122/83 (11/07 1950) Pulse Rate: 82 (11/07 1950)  Labs: Recent Labs    02/19/24 0140 02/20/24 0239 02/20/24 1603 02/20/24 2335 02/21/24 0519 02/21/24 2046  HGB 12.6* 12.3*  --   --  12.1*  --   HCT 39.9 39.2  --   --  37.7*  --   PLT 212 223  --   --  230  --   LABPROT  --   --   --   --  14.3  --   INR  --   --   --   --  1.1  --   HEPARINUNFRC 0.44 0.28*   < > 0.45 0.41 0.31  CREATININE  --  0.92  --   --  0.99  --    < > = values in this interval not displayed.    Estimated Creatinine Clearance: 89.9 mL/min (by C-G formula based on SCr of 0.99 mg/dL).   Assessment: 63 yo male with incidental L sided PE without right heart strain, BL LE DVTs and  CVA with new metastatic pancreatic cancer. No anticoagulation prior to admission. Pharmacy consulted for heparin.    Heparin level therapeutic after resumption.  No bleeding per RN.  Goal of Therapy:  Heparin level: 0.3-0.5 units/mL Monitor platelets by anticoagulation protocol: Yes   Plan:  Continue heparin gtt at 1800 units/hr F/u AM labs Monitor CBC, signs/symptoms of bleeding  Will switch to apixaban on Sunday, 11/9 AM (48 hours post-procedure) due to high bleeding risk  Alexx Giambra D. Lendell, PharmD, BCPS, BCCCP 02/21/2024, 9:40 PM

## 2024-02-21 NOTE — Plan of Care (Signed)
  Problem: Ischemic Stroke/TIA Tissue Perfusion: Goal: Complications of ischemic stroke/TIA will be minimized Outcome: Progressing   Problem: Health Behavior/Discharge Planning: Goal: Goals will be collaboratively established with patient/family Outcome: Progressing   Problem: Self-Care: Goal: Verbalization of feelings and concerns over difficulty with self-care will improve Outcome: Progressing Goal: Ability to communicate needs accurately will improve Outcome: Progressing   Problem: Nutrition: Goal: Risk of aspiration will decrease Outcome: Progressing   Problem: Clinical Measurements: Goal: Will remain free from infection Outcome: Progressing Goal: Respiratory complications will improve Outcome: Progressing Goal: Cardiovascular complication will be avoided Outcome: Progressing   Problem: Activity: Goal: Risk for activity intolerance will decrease Outcome: Progressing   Problem: Elimination: Goal: Will not experience complications related to bowel motility Outcome: Progressing Goal: Will not experience complications related to urinary retention Outcome: Progressing   Problem: Pain Managment: Goal: General experience of comfort will improve and/or be controlled Outcome: Progressing   Problem: Safety: Goal: Ability to remain free from injury will improve Outcome: Progressing   Problem: Skin Integrity: Goal: Risk for impaired skin integrity will decrease Outcome: Progressing

## 2024-02-22 LAB — CBC
HCT: 37.9 % — ABNORMAL LOW (ref 39.0–52.0)
Hemoglobin: 12.2 g/dL — ABNORMAL LOW (ref 13.0–17.0)
MCH: 29.7 pg (ref 26.0–34.0)
MCHC: 32.2 g/dL (ref 30.0–36.0)
MCV: 92.2 fL (ref 80.0–100.0)
Platelets: 193 K/uL (ref 150–400)
RBC: 4.11 MIL/uL — ABNORMAL LOW (ref 4.22–5.81)
RDW: 13.7 % (ref 11.5–15.5)
WBC: 11.2 K/uL — ABNORMAL HIGH (ref 4.0–10.5)
nRBC: 0 % (ref 0.0–0.2)

## 2024-02-22 LAB — HEPARIN LEVEL (UNFRACTIONATED): Heparin Unfractionated: 0.37 [IU]/mL (ref 0.30–0.70)

## 2024-02-22 NOTE — Progress Notes (Addendum)
 Subjective: Riley Wright is a 63 y.o. 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 Pt was placed under IVC on 11/2.   Interval update: Patient was sleeping when examined at bedside this morning. He did wake up. Found that he was bleeding from his IV site on right wrist but not profusely bleeding. Patient is not oriented to place, especially to city. This seems to have been baseline since patient has been in the hospital.   Objective:  Vital signs in last 24 hours: Vitals:   02/21/24 1950 02/21/24 2320 02/22/24 0344 02/22/24 0743  BP: 122/83 116/76 107/76 116/82  Pulse: 82 73 70 76  Resp: 20 20 20 16   Temp: 99 F (37.2 C) 99.7 F (37.6 C) 98.7 F (37.1 C) 98.3 F (36.8 C)  TempSrc: Oral Oral Oral Oral  SpO2: 96% 97% 96% 97%  Weight:      Height:        Physical Exam Vitals reviewed.  Constitutional:      General: He is not in acute distress. Pulmonary:     Effort: Pulmonary effort is normal. No respiratory distress.     Breath sounds: No wheezing.  Abdominal:     Tenderness: There is no abdominal tenderness.     Comments: No bleeding from liver biopsy site. Bandaid in place   Skin:    General: Skin is warm and dry.  Neurological:     Mental Status: He is alert.  Psychiatric:        Cognition and Memory: Cognition is impaired. Memory is impaired.   Expressive Aphasia: Conversationally impaired.      Latest Ref Rng & Units 02/22/2024    2:42 AM 02/21/2024    5:19 AM 02/20/2024    2:39 AM  CBC  WBC 4.0 - 10.5 K/uL 11.2  10.5  9.6   Hemoglobin 13.0 - 17.0 g/dL 87.7  87.8  87.6   Hematocrit 39.0 - 52.0 % 37.9  37.7  39.2   Platelets 150 - 400 K/uL 193  230  223         Latest Ref Rng & Units 02/21/2024    5:19 AM 02/20/2024    2:39 AM 02/17/2024    2:27 AM  BMP  Glucose 70 - 99 mg/dL 891   864  88   BUN 8 - 23 mg/dL 10  10  12    Creatinine 0.61 - 1.24 mg/dL 9.00  9.07  8.96   Sodium 135 - 145 mmol/L 139  139  137   Potassium 3.5 - 5.1 mmol/L 4.1  3.7  3.7   Chloride 98 - 111 mmol/L 102  101  98   CO2 22 - 32 mmol/L 26  26  24    Calcium 8.9 - 10.3 mg/dL 9.0  8.8  8.5    Assessment/Plan:  Principal Problem:   Metastatic cancer (HCC) Active Problems:   Acute CVA (cerebrovascular accident) (HCC)   Elevated ALT measurement   Elevated AST (SGOT)   Leukocytosis   Liver masses   Mass of pancreas   Pulmonary embolus (HCC)   DVT (deep venous thrombosis) (HCC)   Elevated alkaline phosphatase level   Altered mental status  #AMS Pt required code stroke for temporary altered mental status of GCS 4 and somnolence. Multiple infarcts noted on MR Brain on 11/1 and 10/31. This could  likely be new baseline mental status   - Neurology has signed off. Likely could be malignancy over stroke noted on imaging  - there was the recommendation of MRI of the brain with and without gadolinium in 1 week for better look at multiple areas of infarct. Dont think this will change management, so will hold off on any decisions with this until after we get biopsy results   - delirium precautions   #Metastatic cancer, suspected pancreatic as primary source #Elevated AST, ALT, Alkaline phosphatase  #Metastatic masses in the liver  Patient is POD 1 from hepatic biopsy. Patient appears to be hemodynamically stable, no signs of bleeding from biopsy site and no abdominal tenderness. Awaiting biopsy results to see where origin of cancer is and plan for treatment if at all possible. CEA elevated, so we likely think this could be pancreatic source.  Plan: -Hgb is stable post op, no signs of brisk bleeding and abdominal exam benign  -Appreciate oncology recommendations  - Awaiting biopsy results, procedure completed Friday -Palliative care consulted  - Family deciding GOC. Will continue conversation after  biopsy result   - Pt not an inpatient hospice candidate   - Pt likely not a treatment candidate especially with altered mental status  - Last documented bowel movement was 11/6.  Per RN, verbal handoff from night nurse said patient had BM last night.   - Senna and Miralax  BID  #Metastatic lesions of the brain vs acute embolic CVAs  Patient initially thought to have embolic CVAs in the brain. Likely metastases per neurology vs embolic strokes .  Plan: -Echocardiogram and Transcranial doppler w/ bubble: negative for PFO -TTE negative for valvular vegetations - TEE not necessary as patient is already on heparin for DVT treatment -Appreciate neurology recommendations  -D/cd ASA and plavix   #PE #DVT  Patient was found to have an incidental PE on chest CT and also found to have bilateral acute deep vein thrombosis in lower extremity. High suspicion for trousseau syndrome.  Plan: -Continue heparin drip  -Will transition to eliquis tomorrow per IR, starting 10 mg BID  #Leukocytosis: Stable and likely reactive   Resolved Problems:  #AGMA __________________________________  Code Status: Full VTE Prophylaxis:Heparin drip Diet:Regular  IVF:N/A Barriers to Discharge: s/p liver biopsy on Friday. Will need oncology treatment recs prior to selecting/applying for appropriate discharge location. Will likely be here past Tuesday unless patient changes to hospice care. Dispo: Anticipated discharge next week to unknown location.  D'Mello, Monque Haggar, DO 02/22/2024, 9:45 AM Please contact the on call pager at: [phone]

## 2024-02-22 NOTE — Progress Notes (Signed)
 PHARMACY - ANTICOAGULATION CONSULT NOTE  Pharmacy Consult for heparin  Indication: PE, splenic infarct  No Known Allergies  Patient Measurements: Height: 6' 2 (188 cm) Weight: 94.2 kg (207 lb 9.6 oz) IBW/kg (Calculated) : 82.2 HEPARIN DW (KG): 94.2  Vital Signs: Temp: 98.3 F (36.8 C) (11/08 0743) Temp Source: Oral (11/08 0743) BP: 116/82 (11/08 0743) Pulse Rate: 76 (11/08 0743)  Labs: Recent Labs    02/20/24 0239 02/20/24 1603 02/21/24 0519 02/21/24 2046 02/22/24 0242  HGB 12.3*  --  12.1*  --  12.2*  HCT 39.2  --  37.7*  --  37.9*  PLT 223  --  230  --  193  LABPROT  --   --  14.3  --   --   INR  --   --  1.1  --   --   HEPARINUNFRC 0.28*   < > 0.41 0.31 0.37  CREATININE 0.92  --  0.99  --   --    < > = values in this interval not displayed.    Estimated Creatinine Clearance: 89.9 mL/min (by C-G formula based on SCr of 0.99 mg/dL).   Assessment: 63 yo male with incidental L sided PE without right heart strain, BL LE DVTs and  CVA with new metastatic pancreatic cancer. No anticoagulation prior to admission. Pharmacy consulted for heparin.    Heparin level 0.37 is therapeutic with heparin running at 1800 units/hr. Hgb (12.2) and PLTs (193) are stable. Patient renal function is stable. Per RN, no report of pauses, issues with the line, or signs of bleeding. Patient will be transitioned to apixaban tomorrow morning due to high risk of bleeding.   Goal of Therapy:  Heparin level: 0.3-0.5 units/mL Monitor platelets by anticoagulation protocol: Yes   Plan:  Continue heparin gtt at 1800 units/hr Daily heparin level and CBC while on heparin  Monitor CBC, signs/symptoms of bleeding  Will switch to apixaban on Sunday, 11/9 AM (48 hours post-procedure) due to high bleeding risk  Thank you for allowing pharmacy to be involved with this patient's care.  Mendel Barter, PharmD PGY1 Clinical Pharmacist [facility]  02/22/2024 8:01 AM

## 2024-02-22 NOTE — Plan of Care (Signed)

## 2024-02-22 NOTE — Progress Notes (Signed)
 Daily Progress Note   Patient Name: Riley Wright       Date: 02/22/2024 DOB: 06/18/1960  Age: 63 y.o. MRN#: [id number] Attending Physician: Karna Fellows, MD Primary Care Physician: Patient, No Pcp Per Admit Date: 02/14/2024  Reason for Consultation/Follow-up: Establishing goals of care    Length of Stay: 8  Current Medications: Scheduled Meds:   [START ON 02/23/2024] apixaban  10 mg Oral BID   Followed by   NOREEN ON 03/01/2024] apixaban  5 mg Oral BID   feeding supplement  237 mL Oral BID BM   multivitamin with minerals  1 tablet Oral Daily   polyethylene glycol  17 g Oral BID   senna-docusate  2 tablet Oral QHS    Continuous Infusions:  heparin 1,800 Units/hr (02/22/24 1543)    PRN Meds: mouth rinse  Physical Exam Vitals reviewed.  Constitutional:      General: He is sleeping. He is not in acute distress. Cardiovascular:     Rate and Rhythm: Normal rate.  Pulmonary:     Effort: Pulmonary effort is normal.  Skin:    General: Skin is dry.  Neurological:     Mental Status: He is confused.             Vital Signs: BP 118/82 (BP Location: Left Arm)   Pulse 75   Temp 98.7 F (37.1 C) (Oral)   Resp 16   Ht 6' 2 (1.88 m)   Wt 94.2 kg   SpO2 97%   BMI 26.65 kg/m  SpO2: SpO2: 97 % O2 Device: O2 Device: Room Air O2 Flow Rate: O2 Flow Rate (L/min): 2 L/min    Patient Active Problem List   Diagnosis Date Noted   Altered mental status 02/18/2024   DVT (deep venous thrombosis) (HCC) 02/16/2024   Metastatic cancer (HCC) 02/16/2024   Elevated alkaline phosphatase level 02/16/2024   Mass of pancreas 02/15/2024   Pulmonary embolus (HCC) 02/15/2024   Acute CVA (cerebrovascular accident) (HCC) 02/14/2024   Elevated ALT measurement 02/14/2024   Elevated AST (SGOT)  02/14/2024   Increased anion gap metabolic acidosis 02/14/2024   Leukocytosis 02/14/2024   Liver masses 02/14/2024   Spinal stenosis, lumbar region, with neurogenic claudication 01/26/2015    Palliative Care Assessment & Plan   Patient Profile: 63 y.o. male with past medical  history of lumbar decompression admitted on 02/14/2024 with altered speech and behavior and poor appetite. 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 Also with concern for suspected pancreatic cancer with mets to liver.   Today's Discussion: Chart reviewed and update received from nursing. Patient sleeping in bed in NAD. Sitter at bedside. No family at bedside.  Spoke to patient's sons Riley Wright by phone. Riley Wright visited his father earlier today. Riley Wright shared it is difficult to think that the patient's current mental status may be his new baseline. Riley Wright shared his father seems to get frustrated/agitated with him easily. We discussed that this is likely part of the disease process. We discussed the pending liver biopsy. Riley Wright shared he and his brother have little family support with regards to making goals of care decisions. They are appreciative of PMT support which makes them more confident in their decision making.  Emotional support provided. Encouraged sons to call PMT with questions or concerns. PMT will continue to follow/support. Will check in Monday  Left voicemail for patient's son Riley Wright with callback information.  Recommendations/Plan: Full code and scope of care Family awaiting liver biopsy results Encouraged family to continue discussions re: goals of care Continued PMT support   Code Status:    Code Status Orders  (From admission, onward)           Start     Ordered   02/14/24 1509  Full code  Continuous       Question:  By:  Answer:  Consent: discussion documented in EHR   02/14/24 1511         Extensive chart review has been completed prior to seeing the  patient including labs, vital signs, imaging, progress/consult notes, orders, medications, and available advance directive documents.  Care plan was discussed with attending providers  Time spent: 35 minutes  Thank you for allowing the Palliative Medicine Team to assist in the care of this patient.   [name], NP  Please contact Palliative Medicine Team phone at [phone] for questions and concerns.

## 2024-02-23 LAB — CBC
HCT: 38.3 % — ABNORMAL LOW (ref 39.0–52.0)
Hemoglobin: 12.1 g/dL — ABNORMAL LOW (ref 13.0–17.0)
MCH: 29.3 pg (ref 26.0–34.0)
MCHC: 31.6 g/dL (ref 30.0–36.0)
MCV: 92.7 fL (ref 80.0–100.0)
Platelets: 191 K/uL (ref 150–400)
RBC: 4.13 MIL/uL — ABNORMAL LOW (ref 4.22–5.81)
RDW: 13.7 % (ref 11.5–15.5)
WBC: 12.4 K/uL — ABNORMAL HIGH (ref 4.0–10.5)
nRBC: 0 % (ref 0.0–0.2)

## 2024-02-23 LAB — HEPARIN LEVEL (UNFRACTIONATED): Heparin Unfractionated: 0.44 [IU]/mL (ref 0.30–0.70)

## 2024-02-23 NOTE — Progress Notes (Signed)
 Subjective: Riley Wright is a 63 y.o. 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 Pt was placed under IVC on 11/2.   Interval update: 02/23/2024 There were no acute events overnight. Vital signs have been stable, and morning labs were notable for WBC 12.4 from 11.2. There was no change from palliative care, and we are still waiting on liver biopsy results. Pt reports that he slept okay last night, and denies any fever, chest pain, SOB, diarrhea, any pain anywhere, or any concerns whatsoever. He does not like staying at the hospital.   Objective:  Vital signs in last 24 hours: Vitals:   02/22/24 2013 02/22/24 2349 02/23/24 0334 02/23/24 0830  BP: 119/81 122/84 124/77 118/75  Pulse: 83 66 86 70  Resp: 16 20 20 15   Temp: 98.6 F (37 C) 98.9 F (37.2 C) 98.6 F (37 C) 98.4 F (36.9 C)  TempSrc: Oral Oral Oral Oral  SpO2: 98% 98% 96% 97%  Weight:      Height:        Physical Exam Vitals reviewed.  Constitutional:      General: He is not in acute distress. Cardiovascular:     Rate and Rhythm: Normal rate and regular rhythm.  Pulmonary:     Effort: Pulmonary effort is normal. No respiratory distress.     Breath sounds: No wheezing.  Abdominal:     General: There is no distension.     Palpations: Abdomen is soft.     Tenderness: There is no abdominal tenderness.     Comments: No bleeding from liver biopsy site. Bandaid in place   Skin:    General: Skin is warm and dry.  Neurological:     Mental Status: He is alert.  Psychiatric:     Comments: Expressive aphasia. Unable to identify the current year when listed out for him. Seems confused, but assessment difficult given difficulty with word finding.        Latest Ref Rng & Units 02/23/2024    1:09 AM 02/22/2024    2:42 AM 02/21/2024    5:19 AM  CBC   WBC 4.0 - 10.5 K/uL 12.4  11.2  10.5   Hemoglobin 13.0 - 17.0 g/dL 87.8  87.7  87.8   Hematocrit 39.0 - 52.0 % 38.3  37.9  37.7   Platelets 150 - 400 K/uL 191  193  230         Latest Ref Rng & Units 02/21/2024    5:19 AM 02/20/2024    2:39 AM 02/17/2024    2:27 AM  BMP  Glucose 70 - 99 mg/dL 891  864  88   BUN 8 - 23 mg/dL 10  10  12    Creatinine 0.61 - 1.24 mg/dL 9.00  9.07  8.96   Sodium 135 - 145 mmol/L 139  139  137   Potassium 3.5 - 5.1 mmol/L 4.1  3.7  3.7   Chloride 98 - 111 mmol/L 102  101  98   CO2 22 - 32 mmol/L 26  26  24    Calcium 8.9 - 10.3 mg/dL 9.0  8.8  8.5    Assessment/Plan:  Principal Problem:   Metastatic cancer (HCC) Active Problems:   Acute CVA (cerebrovascular accident) (HCC)   Elevated ALT measurement   Elevated AST (SGOT)   Leukocytosis   Liver  masses   Mass of pancreas   Pulmonary embolus (HCC)   DVT (deep venous thrombosis) (HCC)   Elevated alkaline phosphatase level   Altered mental status  #AMS Pt required code stroke for temporary altered mental status of GCS 4 and somnolence. Multiple infarcts noted on MR Brain on 11/1 and 10/31. This could likely be new baseline mental status   - Neurology has signed off. Likely could be malignancy over stroke noted on imaging  - there was the recommendation of MRI of the brain with and without gadolinium in 1 week for better look at multiple areas of infarct. Dont think this will change management, so will hold off on any decisions with this until after we get biopsy results  - delirium precautions   #Metastatic cancer, suspected pancreatic as primary source #Elevated AST, ALT, Alkaline phosphatase  #Metastatic masses in the liver  Patient had hepatic biopsy on 11/7. Patient appears to be hemodynamically stable, no signs of bleeding from biopsy site and no abdominal tenderness. Awaiting biopsy results to see where origin of cancer is and plan for treatment if at all possible. CEA elevated, so we likely  think this could be pancreatic source.  Plan: -Hgb is stable post op, no signs of brisk bleeding and abdominal exam benign  -Appreciate oncology recommendations  - Awaiting biopsy results, procedure completed 11/7 -Palliative care consulted  - Family deciding GOC. Will continue conversation after biopsy result   - Pt not an inpatient hospice candidate   - Pt likely not a treatment candidate especially with altered mental status  - Last documented bowel movement was 11/8.    - Senna at bedtime and Miralax  BID  #Metastatic lesions of the brain vs acute embolic CVAs  Patient initially thought to have embolic CVAs in the brain. Likely metastases per neurology vs embolic strokes .  Plan: -Echocardiogram and Transcranial doppler w/ bubble: negative for PFO -TTE negative for valvular vegetations - TEE not necessary as patient is already on heparin for DVT treatment -Appreciate neurology recommendations  -D/cd ASA and plavix   #PE #DVT  Patient was found to have an incidental PE on chest CT and also found to have bilateral acute deep vein thrombosis in lower extremity. High suspicion for trousseau syndrome.  Plan: -Stopped heparin drip  -Started eliquis per IR, starting 10 mg BID, reduce to 5mg  starting 11/16  #Leukocytosis: Stable and likely reactive   Resolved Problems:  #AGMA __________________________________  Code Status: Full VTE Prophylaxis:Heparin drip Diet:Regular  IVF:N/A Barriers to Discharge: s/p liver biopsy on Friday. Will need oncology treatment recs prior to selecting/applying for appropriate discharge location. Will likely be here past Tuesday unless patient changes to hospice care. Dispo: Anticipated discharge next week to unknown location.  Riley Nancyann NOVAK, DO 02/23/2024, 10:56 AM Please contact the on call pager at: [phone]

## 2024-02-23 NOTE — Discharge Instructions (Addendum)
  Mr. Riley Wright was hospitalized for stroke-like symptoms. We treated him with blood thinners and did further diagnostic workup into the reason for his symptoms. The testing results showed widespread metastatic pancreatic cancer. Given the totality of symptoms and the extent of the disease, there were no viable treatment options. Goals were adjusted to focus on providing maximum comfort. We worked with the palliative care team to transfer patient to a hospice facility. He was able to be transferred to [facility] on 02/26/2024.   [name] Mori; D.O. Internal Medicine Inpatient Teaching Service at [facility]   ===========  Information on my medicine - ELIQUIS (apixaban)  This medication education was reviewed with me or my healthcare representative as part of my discharge preparation.  The pharmacist that spoke with me during my hospital stay was:  Why was Eliquis prescribed for you? Eliquis was prescribed to treat blood clots that may have been found in the veins of your legs (deep vein thrombosis) or in your lungs (pulmonary embolism) and to reduce the risk of them occurring again.  What do You need to know about Eliquis ? The starting dose is 10 mg (two 5 mg tablets) taken TWICE daily for the FIRST SEVEN (7) DAYS, then on March 01, 2024  the dose is reduced to ONE 5 mg tablet taken TWICE daily.  Eliquis may be taken with or without food.   Try to take the dose about the same time in the morning and in the evening. If you have difficulty swallowing the tablet whole please discuss with your pharmacist how to take the medication safely.  Take Eliquis exactly as prescribed and DO NOT stop taking Eliquis without talking to the doctor who prescribed the medication.  Stopping may increase your risk of developing a new blood clot.  Refill your prescription before you run out.  After discharge, you should have regular check-up appointments with your healthcare provider that is prescribing your  Eliquis.    What do you do if you miss a dose? If a dose of ELIQUIS is not taken at the scheduled time, take it as soon as possible on the same day and twice-daily administration should be resumed. The dose should not be doubled to make up for a missed dose.  Important Safety Information A possible side effect of Eliquis is bleeding. You should call your healthcare provider right away if you experience any of the following: Bleeding from an injury or your nose that does not stop. Unusual colored urine (red or dark brown) or unusual colored stools (red or black). Unusual bruising for unknown reasons. A serious fall or if you hit your head (even if there is no bleeding).  Some medicines may interact with Eliquis and might increase your risk of bleeding or clotting while on Eliquis. To help avoid this, consult your healthcare provider or pharmacist prior to using any new prescription or non-prescription medications, including herbals, vitamins, non-steroidal anti-inflammatory drugs (NSAIDs) and supplements.  This website has more information on Eliquis (apixaban): http://[email or web address].com/eliquis/home

## 2024-02-23 NOTE — Progress Notes (Signed)
 PHARMACY - ANTICOAGULATION CONSULT NOTE  Pharmacy Consult for heparin  Indication: PE, splenic infarct  No Known Allergies  Patient Measurements: Height: 6' 2 (188 cm) Weight: 94.2 kg (207 lb 9.6 oz) IBW/kg (Calculated) : 82.2 HEPARIN DW (KG): 94.2  Vital Signs: Temp: 98.6 F (37 C) (11/09 0334) Temp Source: Oral (11/09 0334) BP: 124/77 (11/09 0334) Pulse Rate: 86 (11/09 0334)  Labs: Recent Labs    02/21/24 0519 02/21/24 2046 02/22/24 0242 02/23/24 0109  HGB 12.1*  --  12.2* 12.1*  HCT 37.7*  --  37.9* 38.3*  PLT 230  --  193 191  LABPROT 14.3  --   --   --   INR 1.1  --   --   --   HEPARINUNFRC 0.41 0.31 0.37 0.44  CREATININE 0.99  --   --   --     Estimated Creatinine Clearance: 89.9 mL/min (by C-G formula based on SCr of 0.99 mg/dL).   Assessment: 63 yo male with incidental L sided PE without right heart strain, BL LE DVTs and  CVA with new metastatic pancreatic cancer. No anticoagulation prior to admission. Pharmacy consulted for heparin.    Heparin level 0.44 is therapeutic with heparin running at 1800 units/hr. Hgb (12.1) and PLTs (191) are stable. Patient renal function is stable. Per RN, no report of pauses, issues with the line, or signs of bleeding. Patient will be transitioned to apixaban this morning.   Goal of Therapy:  Heparin level: 0.3-0.5 units/mL Monitor platelets by anticoagulation protocol: Yes   Plan:  Continue heparin gtt at 1800 units/hr Daily heparin level and CBC while on heparin  Monitor CBC, signs/symptoms of bleeding  Will switch to apixaban today, 11/9 AM (48 hours post-procedure) due to high bleeding risk  Thank you for allowing pharmacy to be involved with this patient's care.  Mendel Barter, PharmD PGY1 Clinical Pharmacist [facility]  02/23/2024 7:11 AM

## 2024-02-23 NOTE — Plan of Care (Signed)
  Problem: Education: Goal: Knowledge of disease or condition will improve Outcome: Progressing Goal: Knowledge of secondary prevention will improve (MUST DOCUMENT ALL) Outcome: Progressing Goal: Knowledge of patient specific risk factors will improve (DELETE if not current risk factor) Outcome: Progressing   Problem: Self-Care: Goal: Ability to participate in self-care as condition permits will improve Outcome: Progressing Goal: Verbalization of feelings and concerns over difficulty with self-care will improve Outcome: Progressing Goal: Ability to communicate needs accurately will improve Outcome: Progressing   Problem: Nutrition: Goal: Risk of aspiration will decrease Outcome: Progressing Goal: Dietary intake will improve Outcome: Progressing   Problem: Clinical Measurements: Goal: Ability to maintain clinical measurements within normal limits will improve Outcome: Progressing Goal: Will remain free from infection Outcome: Progressing Goal: Diagnostic test results will improve Outcome: Progressing Goal: Respiratory complications will improve Outcome: Progressing Goal: Cardiovascular complication will be avoided Outcome: Progressing

## 2024-02-23 NOTE — Plan of Care (Signed)

## 2024-02-24 ENCOUNTER — Inpatient Hospital Stay (HOSPITAL_COMMUNITY)

## 2024-02-24 DIAGNOSIS — K8689 Other specified diseases of pancreas: Secondary | ICD-10-CM | POA: Diagnosis not present

## 2024-02-24 DIAGNOSIS — C787 Secondary malignant neoplasm of liver and intrahepatic bile duct: Secondary | ICD-10-CM | POA: Diagnosis not present

## 2024-02-24 DIAGNOSIS — I639 Cerebral infarction, unspecified: Secondary | ICD-10-CM | POA: Diagnosis not present

## 2024-02-24 LAB — CBC
HCT: 39.2 % (ref 39.0–52.0)
Hemoglobin: 12.7 g/dL — ABNORMAL LOW (ref 13.0–17.0)
MCH: 29.6 pg (ref 26.0–34.0)
MCHC: 32.4 g/dL (ref 30.0–36.0)
MCV: 91.4 fL (ref 80.0–100.0)
Platelets: 146 K/uL — ABNORMAL LOW (ref 150–400)
RBC: 4.29 MIL/uL (ref 4.22–5.81)
RDW: 13.6 % (ref 11.5–15.5)
WBC: 11.2 K/uL — ABNORMAL HIGH (ref 4.0–10.5)
nRBC: 0 % (ref 0.0–0.2)

## 2024-02-24 LAB — SURGICAL PATHOLOGY

## 2024-02-24 LAB — GLUCOSE, CAPILLARY: Glucose-Capillary: 108 mg/dL — ABNORMAL HIGH (ref 70–99)

## 2024-02-24 MED ORDER — GLYCOPYRROLATE 1 MG PO TABS: 1.0000 mg | ORAL_TABLET | ORAL | Status: DC | PRN

## 2024-02-24 MED ORDER — HALOPERIDOL LACTATE 2 MG/ML PO CONC: 2.0000 mg | Freq: Four times a day (QID) | ORAL | Status: DC | PRN

## 2024-02-24 MED ORDER — DIPHENHYDRAMINE HCL 50 MG/ML IJ SOLN: 25.0000 mg | INTRAMUSCULAR | Status: DC | PRN

## 2024-02-24 MED ORDER — MORPHINE SULFATE (PF) 2 MG/ML IV SOLN: 1.0000 mg | INTRAVENOUS | Status: DC | PRN

## 2024-02-24 MED ORDER — ACETAMINOPHEN 650 MG RE SUPP: 650.0000 mg | Freq: Four times a day (QID) | RECTAL | Status: DC | PRN

## 2024-02-24 MED ORDER — ACETAMINOPHEN 325 MG PO TABS: 650.0000 mg | ORAL_TABLET | Freq: Four times a day (QID) | ORAL | Status: DC | PRN

## 2024-02-24 MED ORDER — OXYCODONE HCL 5 MG PO TABS: 5.0000 mg | ORAL_TABLET | ORAL | Status: DC | PRN

## 2024-02-24 MED ORDER — ONDANSETRON 4 MG PO TBDP: 4.0000 mg | ORAL_TABLET | Freq: Four times a day (QID) | ORAL | Status: DC | PRN

## 2024-02-24 MED ORDER — HALOPERIDOL LACTATE 5 MG/ML IJ SOLN: 2.0000 mg | INTRAMUSCULAR | Status: DC | PRN

## 2024-02-24 MED ORDER — IOHEXOL 350 MG/ML SOLN
100.0000 mL | Freq: Once | INTRAVENOUS | Status: AC | PRN
  Administered 2024-02-24: 100 mL via INTRAVENOUS

## 2024-02-24 MED ORDER — LORAZEPAM 2 MG/ML PO CONC: 1.0000 mg | ORAL | Status: DC | PRN

## 2024-02-24 MED ORDER — LORAZEPAM 1 MG PO TABS: 1.0000 mg | ORAL_TABLET | ORAL | Status: DC | PRN

## 2024-02-24 MED ORDER — POLYVINYL ALCOHOL 1.4 % OP SOLN: 1.0000 [drp] | Freq: Four times a day (QID) | OPHTHALMIC | Status: DC | PRN

## 2024-02-24 MED ORDER — GLYCOPYRROLATE 0.2 MG/ML IJ SOLN
0.2000 mg | INTRAMUSCULAR | Status: DC | PRN
Start: 2024-02-24 — End: 2024-02-26

## 2024-02-24 MED ORDER — LORAZEPAM 2 MG/ML IJ SOLN
1.0000 mg | INTRAMUSCULAR | Status: DC | PRN
  Administered 2024-02-25: 1 mg via INTRAVENOUS
  Filled 2024-02-24: qty 1

## 2024-02-24 MED ORDER — BIOTENE DRY MOUTH MT LIQD
15.0000 mL | Freq: Two times a day (BID) | OROMUCOSAL | Status: DC
  Administered 2024-02-24 – 2024-02-26 (×3): 15 mL via TOPICAL

## 2024-02-24 MED ORDER — GLYCOPYRROLATE 0.2 MG/ML IJ SOLN: 0.2000 mg | INTRAMUSCULAR | Status: DC | PRN

## 2024-02-24 MED ORDER — ONDANSETRON HCL 4 MG/2ML IJ SOLN: 4.0000 mg | Freq: Four times a day (QID) | INTRAMUSCULAR | Status: DC | PRN

## 2024-02-24 MED ORDER — HALOPERIDOL 1 MG PO TABS: 2.0000 mg | ORAL_TABLET | Freq: Four times a day (QID) | ORAL | Status: DC | PRN

## 2024-02-24 NOTE — Plan of Care (Signed)
 Neurology was called back on this pt for acute neuro changes. Last NIHSS at 4am was at 4 but on 0545 assessment pt has increased R weakness and left gaze preference. Primary team involved and neurology was contacted. Pt was transitioned from heparin IV to eliquis 10mg  bid yesterday, had two doses so far, last dose last night 10pm. CT, CTA head and neck and CTP ordered. No acute bleeding on CT but showed extended stroke on the left MCA territory with ASPECT at 6. CTA head and neck did not show LVO and unchanged from 02/14/24. CTP showed 25/104 but likely with pseudonormalization and underestimated core. Pt not candidate for any thrombolytics nor IR candidate. If aggressive care, would proceed with MRI and further decide on [facility] based on the final size of infarct. However, highly recommend GOC discussion given the poor prognosis. Of note, after GOC discussion, pt family now requested comfort care measures. Neurology will sign off for now, please call with questions.   Ary Cummins, MD PhD Stroke Neurology 02/24/2024 12:48 PM

## 2024-02-24 NOTE — Plan of Care (Signed)
 Conversation with sons [name] and [name] about goals of care held in-person with Dr. Lera, Dr. Kandis, and Stephane Palin from Palliative. We discussed the prognosis, options moving forward, and family has decided on pursuing comfort cares for the patient Riley Wright with a preference for placement at a hospice facility.   - Transitioning to Comfort Cares

## 2024-02-24 NOTE — Plan of Care (Signed)
  Problem: Education: Goal: Knowledge of the prescribed therapeutic regimen will improve Outcome: Progressing   Problem: Respiratory: Goal: Verbalizations of increased ease of respirations will increase Outcome: Progressing   Problem: Role Relationship: Goal: Family's ability to cope with current situation will improve Outcome: Progressing   Problem: Role Relationship: Goal: Family's ability to cope with current situation will improve Outcome: Progressing   Problem: Pain Management: Goal: Satisfaction with pain management regimen will improve Outcome: Progressing

## 2024-02-24 NOTE — Progress Notes (Signed)
 Pt is back to the unit.

## 2024-02-24 NOTE — Progress Notes (Signed)
 Pt assessed at 0545 and found to have increased R sided weakness and a mild facial droop. MD is at bedside doing rounds and notified of assessment findings.

## 2024-02-24 NOTE — Progress Notes (Addendum)
 Evaluated patient this morning after oncology messaged the team with concerns for a stroke.   At bedside, patient had a evident right facial droop with a left fixed gaze. Along with right hemiplegia.   At this time, the patient's primary nurse was asked to come to the room to obtain more history and to call a code stroke.  The RN entered the room and told us  that she was notified that the neurological signs were present for at least 45 minutes and she talked with the oncologist.  I asked her to call a code stroke and she left the room to do so. RN then returned to the room and notified the team that she cannot call a code stroke because he was admitted with a stoke already. We explained that these changes are new and he needs to be evaluated.   I then went to the nurses station and voiced my concerns that this patient is having new neurological symptoms concerning for a stroke and he was just loaded with eliquis yesterday.   Again, I was told that we were not allowed to call a code stroke for these acute neurological changes because patient presented with a stroke on admission as this is against hospital policy.   We stopped his DOAC, ordered a STAT head CT, and immediately called the on call neurologist who recommended ordering a CTA and CT perfusion.   Plan: -Follow up CT results -Assess if/ when we can restart the DOAC -Patient needs ongoing GOC with sons, will reach out to pallative care again today   Damien Lease, DO Internal Medicine Resident: PGY-2 Please contact the on call pager at: [phone]

## 2024-02-24 NOTE — Plan of Care (Signed)

## 2024-02-24 NOTE — Progress Notes (Addendum)
 IP PROGRESS NOTE  Subjective:   Riley Wright was alert when I saw him at approximately 6:20 AM.  The bedside RN reports he has developed more confusion and dysarthria over the past few hours.  He has no complaint.  He underwent an ultrasound-guided biopsy of a right liver lesion 02/21/2024.  Objective: Vital signs in last 24 hours: Blood pressure (!) 141/100, pulse 87, temperature 99.1 F (37.3 C), temperature source Oral, resp. rate 17, height 6' 2 (1.88 m), weight 207 lb 9.6 oz (94.2 kg), SpO2 99%.  Intake/Output from previous day: 11/09 0701 - 11/10 0700 In: 827.4 [P.O.:476; I.V.:351.4] Out: -   Physical Exam Abdomen: Soft and nontender, no masses, no hepatosplenomegaly Neurologic: Alert, expressive aphasia, right facial droop, moves the left arm and leg to command, not moving the right arm or leg   lab Results: Recent Labs    02/23/24 0109 02/24/24 0236  WBC 12.4* 11.2*  HGB 12.1* 12.7*  HCT 38.3* 39.2  PLT 191 146*     Lab Results  Component Value Date   CEA1 182.0 (H) 02/16/2024   RJW800 11 02/16/2024    Medications: I have reviewed the patient's current medications.  Assessment/Plan: Metastatic pancreas cancer- Pancreas mass, liver/lung lesions 02/14/2024 CTs: 4.9 x 2.4 cm hypodense mass in the pancreas body, innumerable hypodense liver metastases, innumerable subcentimeter bilateral pulmonary nodules, peripheral wedge-shaped hypodensities in the spleen 2.   Pulmonary embolism on CT chest 02/14/2024, no right heart strain 02/15/2024 lower extremity Dopplers: Acute DVT at the right posterior tibial and peroneal veins, left posterior tibial and peroneal veins 3.   Multivessel distribution CVAs 02/14/2024 CT head: Diminished attenuation in the left thalamus concerning for a subacute infarct, focal area of subcortical infarct in the left posterior temporal lobe, multiple areas of age-indeterminate infarction within cerebellar hemispheres-worse on the left 02/14/2024 MRI  brain: Numerous acute ischemic foci in both cerebellar and cerebral hemispheres consistent with emboli, multifocal cerebral T2 hyperintensities consistent with chronic small vessel disease 02/15/2024 MRI brain with contrast: Numerous nodular and linear foci of contrast enhancement, numerous areas of ischemia with no contrast-enhancement Levan 11/03/2023 ultrasound guided biopsy of a right liver lesion-adenocarcinoma consistent with metastatic pancreas adenocarcinoma 4.  Elevated liver enzymes and bilirubin secondary to #1 5.  Altered mental status/partial aphasia secondary to #2   Riley Wright underwent an ultrasound-guided biopsy of a right liver lesion 02/21/2024.  The pathology reveals adenocarcinoma consistent with metastatic pancreas adenocarcinoma.  The clinical presentation is consistent with metastatic pancreas cancer and associated hypercoagulation syndrome.  He has a poor prognosis for survival beyond weeks to a month.  He has a new stroke symptoms this morning.  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  Riley Wright remains confused.  He is not a candidate for chemotherapy.  I recommend hospice care.  He appears to be a candidate for residential hospice.      Recommendations: Refer for hospice care evaluate for transfer to [facility] Comfort measures, consider discontinuing anticoagulation therapy after discussions with family I am available to discuss the diagnosis and prognosis with his family as needed Please call oncology as needed  LOS: 10 days   Arley Hof, MD   02/24/2024, 7:11 AM

## 2024-02-24 NOTE — Progress Notes (Addendum)
 Daily Progress Note   Patient Name: Riley Wright       Date: 02/24/2024 DOB: 03/04/1961  Age: 63 y.o. MRN#: [id number] Attending Physician: Eben Reyes BROCKS, MD Primary Care Physician: Patient, No Pcp Per Admit Date: 02/14/2024  Reason for Consultation/Follow-up: Establishing goals of care    Length of Stay: 10  Current Medications: Scheduled Meds:   antiseptic oral rinse  15 mL Topical BID   polyethylene glycol  17 g Oral BID   senna-docusate  2 tablet Oral QHS    Continuous Infusions:    PRN Meds: acetaminophen  **OR** acetaminophen , artificial tears, diphenhydrAMINE, glycopyrrolate  **OR** glycopyrrolate  **OR** glycopyrrolate , haloperidol **OR** haloperidol **OR** haloperidol lactate, LORazepam **OR** LORazepam **OR** LORazepam, morphine  injection, ondansetron  **OR** ondansetron  (ZOFRAN ) IV, mouth rinse, oxyCODONE   Physical Exam Vitals reviewed.  Constitutional:      General: He is sleeping. He is not in acute distress. Cardiovascular:     Rate and Rhythm: Normal rate.  Pulmonary:     Effort: Pulmonary effort is normal.  Skin:    General: Skin is dry.  Neurological:     Mental Status: He is confused.             Vital Signs: BP 130/87 (BP Location: Left Arm)   Pulse 80   Temp (!) 97.3 F (36.3 C) (Oral)   Resp 16   Ht 6' 2 (1.88 m)   Wt 94.2 kg   SpO2 100%   BMI 26.65 kg/m  SpO2: SpO2: 100 % O2 Device: O2 Device: Room Air O2 Flow Rate: O2 Flow Rate (L/min): 2 L/min    Patient Active Problem List   Diagnosis Date Noted   Altered mental status 02/18/2024   DVT (deep venous thrombosis) (HCC) 02/16/2024   Metastatic cancer (HCC) 02/16/2024   Elevated alkaline phosphatase level 02/16/2024   Mass of pancreas 02/15/2024   Pulmonary embolus (HCC)  02/15/2024   Acute CVA (cerebrovascular accident) (HCC) 02/14/2024   Elevated ALT measurement 02/14/2024   Elevated AST (SGOT) 02/14/2024   Increased anion gap metabolic acidosis 02/14/2024   Leukocytosis 02/14/2024   Liver masses 02/14/2024   Spinal stenosis, lumbar region, with neurogenic claudication 01/26/2015    Palliative Care Assessment & Plan   Patient Profile: 63 y.o. male with past medical history of lumbar decompression admitted on 02/14/2024 with altered speech  and behavior and poor appetite. 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 Also with concern for suspected pancreatic cancer with mets to liver.   Today's Discussion: Chart reviewed and update received from nursing and attending team. Liver biopsy result confirms metastatic pancreatic cancer. The patient had increased R sided weakness and mild facial droop this morning. CTA shows worsening stroke.  Met with Dr. Lera, Dr. Kandis, and  patient's sons Riley Wright and Riley Wright for goals of care meeting. Dr. Lera discussed results of biopsy. The patient is not a candidate for cancer therapies at this time. We discussed options moving forward including transitioning to comfort focussed care. Educated family that once patient is transitioned to comfort focused care he would no longer receive aggressive medical interventions such as continuous vital signs, lab work, radiology testing, or medications not focused on comfort. All care would focus on how the patient is looking and feeling. This would include management of any symptoms that may cause discomfort, pain, shortness of breath, cough, nausea, agitation, anxiety, and/or secretions etc. Symptoms would be managed with medications and other non-pharmacological interventions. We discussed potential locations for end of life care. Family would like to transition patient to comfort focused care and have him evaluated for [facility]. TOC and ACC liaison  notified.  Emotional support provided. Offered chaplain support- family declined at this time. Encouraged sons to call PMT with questions or concerns. PMT will continue to follow/support.   Addendum: ACC met with family. They have no beds today but will reassess tomorrow.  Addendum: Received message from nursing that family has questions regarding comfort measures. Family (sons and aunt) are concerned patient does not have 1:1 care. Discussed that nursing staff will be checking on patient asking patient about needs or using assessment tools for nonverbal patients to assess needs. Reviewed that in comfort measures the goal is to provide comfort to patients in end of life. Gave family gone from my sight booklet to review. Encouraged them to call PMT with questions or needs.  Recommendations/Plan: Changed to DNR Transitioned to full comfort care Evaluation for inpatient hospice at [facility] Continued PMT support  Comfort Orders Oxycodone /Morphine  PRN for pain/air hunger/comfort Robinul  PRN for excessive secretions Ativan PRN for agitation/anxiety Zofran  PRN for nausea Liquifilm tears PRN for dry eyes Haldol PRN for agitation/anxiety May have comfort feeding Comfort cart for family Unrestricted visitations in the setting of EOL (per policy) Oxygen PRN 2L or less for comfort. No escalation.    Extensive chart review has been completed prior to seeing the patient including labs, vital signs, imaging, progress/consult notes, orders, medications, and available advance directive documents.  Care plan was discussed with attending providers, bedside RN, TOC, ACC liaison  Time spent: 95 minutes  Thank you for allowing the Palliative Medicine Team to assist in the care of this patient.   [name], NP  Please contact Palliative Medicine Team phone at [phone] for questions and concerns.

## 2024-02-24 NOTE — Progress Notes (Signed)
 Subjective: Riley Wright is a 63 y.o. 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 Pt was placed under IVC on 11/2.   Interval update: 11/10 There were no acute events overnight. Vital signs have been stable, and morning labs are non-concerning. On exam, pt was acutely changed (see physical exam), and code stroke was attempted to be called, but team was told by multiple floor staff that code stroke could not be called on this patient because of hospital policy. CTA performed shows worsening of stroke, and pathology returned as metastatic pancreatic adenocarcinoma.  Have meeting with sons at 11:30AM for goals of care.  Objective:  Vital signs in last 24 hours: Vitals:   02/24/24 0340 02/24/24 0700 02/24/24 0729 02/24/24 0915  BP: 116/75 (!) 141/100 (!) 132/91 139/89  Pulse: 90 87 86   Resp: 17  19   Temp: 98.4 F (36.9 C) 99.1 F (37.3 C) 98.4 F (36.9 C) 99.8 F (37.7 C)  TempSrc:  Oral Oral Axillary  SpO2: 98% 99% 99%   Weight:      Height:       Physical Exam Vitals reviewed.  Constitutional:      General: He is not in acute distress. Cardiovascular:     Rate and Rhythm: Normal rate and regular rhythm.  Pulmonary:     Effort: Pulmonary effort is normal. No respiratory distress.     Breath sounds: No wheezing.  Abdominal:     General: There is no distension.     Palpations: Abdomen is soft.     Tenderness: There is no abdominal tenderness.     Comments: No bleeding from liver biopsy site. Bandaid in place   Skin:    General: Skin is warm and dry.  Neurological:     Mental Status: He is alert.     Comments: R Facial droop, R hemiplegia, does not know year, unable to stick out tongue or shrug his shoulders. PERRL, CNV intact.        Latest Ref Rng & Units 02/24/2024    2:36 AM 02/23/2024     1:09 AM 02/22/2024    2:42 AM  CBC  WBC 4.0 - 10.5 K/uL 11.2  12.4  11.2   Hemoglobin 13.0 - 17.0 g/dL 87.2  87.8  87.7   Hematocrit 39.0 - 52.0 % 39.2  38.3  37.9   Platelets 150 - 400 K/uL 146  191  193         Latest Ref Rng & Units 02/21/2024    5:19 AM 02/20/2024    2:39 AM 02/17/2024    2:27 AM  BMP  Glucose 70 - 99 mg/dL 891  864  88   BUN 8 - 23 mg/dL 10  10  12    Creatinine 0.61 - 1.24 mg/dL 9.00  9.07  8.96   Sodium 135 - 145 mmol/L 139  139  137   Potassium 3.5 - 5.1 mmol/L 4.1  3.7  3.7   Chloride 98 - 111 mmol/L 102  101  98   CO2 22 - 32 mmol/L 26  26  24    Calcium 8.9 - 10.3 mg/dL 9.0  8.8  8.5    Assessment/Plan:  Principal Problem:   Metastatic cancer (HCC) Active Problems:   Acute CVA (cerebrovascular accident) (HCC)   Elevated ALT measurement   Elevated AST (  SGOT)   Leukocytosis   Liver masses   Mass of pancreas   Pulmonary embolus (HCC)   DVT (deep venous thrombosis) (HCC)   Elevated alkaline phosphatase level   Altered mental status  #AMS Pt required code stroke for temporary altered mental status of GCS 4 and somnolence. Multiple infarcts noted on MR Brain on 11/1 and 10/31. This could likely be new baseline mental status   - Neurology has signed off. Likely could be malignancy over stroke noted on imaging  - there was the recommendation of MRI of the brain with and without gadolinium in 1 week for better look at multiple areas of infarct. Dont think this will change management, so will hold off on any decisions with this until after we get biopsy results  - delirium precautions   #Metastatic cancer, suspected pancreatic as primary source #Elevated AST, ALT, Alkaline phosphatase  #Metastatic masses in the liver  Patient had hepatic biopsy on 11/7. Patient appears to be hemodynamically stable, no signs of bleeding from biopsy site and no abdominal tenderness. Awaiting biopsy results to see where origin of cancer is and plan for treatment if at all  possible. CEA elevated, so we likely think this could be pancreatic source.  Plan: -Hgb is stable post op, no signs of brisk bleeding and abdominal exam benign  -Appreciate oncology recommendations  - Awaiting biopsy results, procedure completed 11/7 -Palliative care consulted  - Family deciding GOC. Will continue conversation after biopsy result   - Pt not an inpatient hospice candidate   - Pt likely not a treatment candidate especially with altered mental status  - Last documented bowel movement was 11/8.    - Senna at bedtime and Miralax  BID  #Metastatic lesions of the brain vs acute embolic CVAs  Patient initially thought to have embolic CVAs in the brain. Likely metastases per neurology vs embolic strokes .  Plan: -Echocardiogram and Transcranial doppler w/ bubble: negative for PFO -TTE negative for valvular vegetations - TEE not necessary as patient is already on heparin for DVT treatment -Appreciate neurology recommendations  -D/cd ASA and plavix   #PE #DVT  Patient was found to have an incidental PE on chest CT and also found to have bilateral acute deep vein thrombosis in lower extremity. High suspicion for trousseau syndrome.  Plan: -Stopped heparin drip  -Started eliquis per IR, starting 10 mg BID, reduce to 5mg  starting 11/16  #Leukocytosis: Stable and likely reactive   Resolved Problems:  #AGMA __________________________________  Code Status: Full VTE Prophylaxis:Heparin drip Diet:Regular  IVF:N/A Barriers to Discharge: s/p liver biopsy on Friday. Will need oncology treatment recs prior to selecting/applying for appropriate discharge location. Will likely be here past Tuesday unless patient changes to hospice care. Dispo: TBD by GOC with sons. Likely to hospice.  [name], DO 02/24/2024, 10:44 AM Please contact the on call pager at: [phone]

## 2024-02-24 NOTE — Progress Notes (Signed)
 [facility] [phone] [facility]  Hospice hospital liaison note   Referral received from IP CM for family interest in [facility].     Chart currently under review, eligibility is pending at this time.    Unfortunately [facility] is unable to offer a bed today.   TOC aware that liaison will follow up tomorrow or sooner if a bed becomes available.    Thank you for the opportunity to participate in this patient's care Eleanor Nail, LPN Hospice nurse liaison [phone]

## 2024-02-24 NOTE — Progress Notes (Signed)
Pt is off the unit for CT.  

## 2024-02-24 NOTE — TOC Progression Note (Signed)
 Transition of Care [facility]) - Progression Note    Patient Details  Name: Riley Wright MRN: [id number] Date of Birth: Dec 08, 1960  [facility] of [facility] CM/SW Contact  Luann SHAUNNA [place], [place] Phone Number: 02/24/2024, 2:12 PM  Clinical Narrative:     Per Eleanor with [facility], no beds at [facility] currently; they will reassess in the morning.   Expected Discharge Plan: Hospice Medical Facility Barriers to Discharge: Continued Medical Work up, English As A Second Language Teacher, Hospice Bed not available               Expected Discharge Plan and Services     Post Acute Care Choice: Skilled Nursing Facility Living arrangements for the past 2 months: Single Family Home                                       Social Drivers of Health (SDOH) Interventions SDOH Screenings   Food Insecurity: Low Risk  (09/17/2022)   Received from [facility]  Housing: Low Risk  (09/17/2022)   Received from [facility]  Transportation Needs: No Transportation Needs (09/17/2022)   Received from [facility]  Utilities: Low Risk  (09/17/2022)   Received from [facility]  Tobacco Use: Medium Risk (02/14/2024)    Readmission Risk Interventions     No data to display

## 2024-02-24 NOTE — TOC Progression Note (Signed)
 Transition of Care [facility]) - Progression Note    Patient Details  Name: Riley Wright MRN: [id number] Date of Birth: 1961/01/25  Transition of Care [facility]) CM/SW Contact  Almarie CHRISTELLA [place], [place] Phone Number: 02/24/2024, 12:40 PM  Clinical Narrative:   CSW updated by palliative NP after goals of care meeting that patient's children would like patient evaluated for [facility]. Referral placed, [facility] to review. CSW to follow.    Expected Discharge Plan: Hospice Medical Facility Barriers to Discharge: Continued Medical Work up, English As A Second Language Teacher, Hospice Bed not available               Expected Discharge Plan and Services     Post Acute Care Choice: Skilled Nursing Facility Living arrangements for the past 2 months: Single Family Home                                       Social Drivers of Health (SDOH) Interventions SDOH Screenings   Food Insecurity: Low Risk  (09/17/2022)   Received from [facility]  Housing: Low Risk  (09/17/2022)   Received from [facility]  Transportation Needs: No Transportation Needs (09/17/2022)   Received from [facility]  Utilities: Low Risk  (09/17/2022)   Received from [facility]  Tobacco Use: Medium Risk (02/14/2024)    Readmission Risk Interventions     No data to display

## 2024-02-25 DIAGNOSIS — C787 Secondary malignant neoplasm of liver and intrahepatic bile duct: Secondary | ICD-10-CM | POA: Diagnosis not present

## 2024-02-25 NOTE — Progress Notes (Signed)
 Subjective: Riley Wright is a 63 y.o. 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 Pt was placed under IVC on 11/2.   Interval update: 11/11 There were no acute events overnight. Vital signs have been stable. Patient is on comfort cares. He was resting comfortably this morning watching TV. He said that he did not have any concerns, and that he was not in any pain anywhere. We explained that we were working on transitioning him to a hospice facility to keep him comfortable.   Pt is being evaluated by [facility] for hospice placement.  Objective:  Vital signs in last 24 hours: Vitals:   02/24/24 0915 02/24/24 1122 02/24/24 2003 02/25/24 0806  BP: 139/89 130/87 127/88 126/83  Pulse:  80 88 87  Resp:  16  16  Temp: 99.8 F (37.7 C) (!) 97.3 F (36.3 C) 98.3 F (36.8 C) (!) 97.5 F (36.4 C)  TempSrc: Axillary Oral Oral Oral  SpO2:  100% 98% 96%  Weight:      Height:       Physical Exam Vitals and nursing note reviewed. Exam conducted with a chaperone present.  Constitutional:      General: He is not in acute distress.    Appearance: He is normal weight.  Pulmonary:     Effort: Pulmonary effort is normal. No respiratory distress.  Genitourinary:    Comments: Condom cath applied Skin:    General: Skin is warm and dry.  Neurological:     Mental Status: He is alert.       Latest Ref Rng & Units 02/24/2024    2:36 AM 02/23/2024    1:09 AM 02/22/2024    2:42 AM  CBC  WBC 4.0 - 10.5 K/uL 11.2  12.4  11.2   Hemoglobin 13.0 - 17.0 g/dL 87.2  87.8  87.7   Hematocrit 39.0 - 52.0 % 39.2  38.3  37.9   Platelets 150 - 400 K/uL 146  191  193         Latest Ref Rng & Units 02/21/2024    5:19 AM 02/20/2024    2:39 AM 02/17/2024    2:27 AM  BMP  Glucose 70 - 99 mg/dL 891  864  88   BUN 8 - 23 mg/dL 10  10   12    Creatinine 0.61 - 1.24 mg/dL 9.00  9.07  8.96   Sodium 135 - 145 mmol/L 139  139  137   Potassium 3.5 - 5.1 mmol/L 4.1  3.7  3.7   Chloride 98 - 111 mmol/L 102  101  98   CO2 22 - 32 mmol/L 26  26  24    Calcium 8.9 - 10.3 mg/dL 9.0  8.8  8.5    Assessment/Plan:  Principal Problem:   Metastatic cancer (HCC) Active Problems:   Acute CVA (cerebrovascular accident) (HCC)   Elevated ALT measurement   Elevated AST (SGOT)   Leukocytosis   Liver masses   Mass of pancreas   Pulmonary embolus (HCC)   DVT (deep venous thrombosis) (HCC)   Elevated alkaline phosphatase level   Altered mental status  #AMS Pt required code stroke for temporary altered mental status of GCS 4 and somnolence. Multiple infarcts noted on MR Brain on 11/1 and 10/31. There had not been any improvement, and pt is now on comfort cares.  -  Neurology has signed off. - delirium precautions   #Metastatic cancer, suspected pancreatic as primary source #Elevated AST, ALT, Alkaline phosphatase  #Metastatic masses in the liver  Liver biopsy confirms this is metastatic pancreatic cancer. Plan: -Hgb is stable post op, no signs of brisk bleeding and abdominal exam benign  -Palliative care consulted, pt on comfort cares.  - Pt on comfort cares as directed by Palliative team  #Metastatic lesions of the brain vs acute embolic CVAs  Patient initially thought to have embolic CVAs in the brain. Likely metastases per neurology vs embolic strokes. Lesion worsened on 11/10  Plan: - Comfort Cares #PE #DVT  Patient was found to have an incidental PE on chest CT and also found to have bilateral acute deep vein thrombosis in lower extremity. High suspicion for trousseau syndrome.  Plan: - Stopped VTE prophylaxis dt Comfort Cares status  #Leukocytosis: Stable and likely reactive   Resolved Problems:  #AGMA __________________________________  Code Status: DNR-Comfort Cares VTE Prophylaxis: None Diet:  Regular IVF:N/A Barriers to Discharge: Acceptance to Hospice Dispo: As soon as patient is accepted to hospice.  [name], DO 02/25/2024, 10:52 AM Please contact the on call pager at: [phone]

## 2024-02-25 NOTE — Progress Notes (Signed)
 [facility] [phone] [facility] Liaison Note  Received request from IP CM for family interest in [facility]. Eligibility confirmed.   Unfortunately [facility] is unable to offer a bed today.   TOC aware that liaison will follow up tomorrow or sooner if a bed becomes available.   Eleanor Nail, LPN [facility] Liaison [phone]

## 2024-02-25 NOTE — Plan of Care (Signed)
 Problem: Education: Goal: Knowledge of disease or condition will improve Outcome: Not Progressing Goal: Knowledge of secondary prevention will improve (MUST DOCUMENT ALL) Outcome: Not Progressing Goal: Knowledge of patient specific risk factors will improve (DELETE if not current risk factor) Outcome: Not Progressing   Problem: Ischemic Stroke/TIA Tissue Perfusion: Goal: Complications of ischemic stroke/TIA will be minimized Outcome: Not Progressing   Problem: Coping: Goal: Will verbalize positive feelings about self Outcome: Not Progressing Goal: Will identify appropriate support needs Outcome: Not Progressing   Problem: Health Behavior/Discharge Planning: Goal: Ability to manage health-related needs will improve Outcome: Not Progressing Goal: Goals will be collaboratively established with patient/family Outcome: Not Progressing   Problem: Self-Care: Goal: Ability to participate in self-care as condition permits will improve Outcome: Not Progressing Goal: Verbalization of feelings and concerns over difficulty with self-care will improve Outcome: Not Progressing Goal: Ability to communicate needs accurately will improve Outcome: Not Progressing   Problem: Nutrition: Goal: Risk of aspiration will decrease Outcome: Not Progressing Goal: Dietary intake will improve Outcome: Not Progressing   Problem: Education: Goal: Knowledge of General Education information will improve Description: Including pain rating scale, medication(s)/side effects and non-pharmacologic comfort measures Outcome: Not Progressing   Problem: Health Behavior/Discharge Planning: Goal: Ability to manage health-related needs will improve Outcome: Not Progressing   Problem: Clinical Measurements: Goal: Ability to maintain clinical measurements within normal limits will improve Outcome: Not Progressing Goal: Will remain free from infection Outcome: Not Progressing Goal: Diagnostic test results will  improve Outcome: Not Progressing Goal: Respiratory complications will improve Outcome: Not Progressing Goal: Cardiovascular complication will be avoided Outcome: Not Progressing   Problem: Activity: Goal: Risk for activity intolerance will decrease Outcome: Not Progressing   Problem: Nutrition: Goal: Adequate nutrition will be maintained Outcome: Not Progressing   Problem: Coping: Goal: Level of anxiety will decrease Outcome: Not Progressing   Problem: Elimination: Goal: Will not experience complications related to bowel motility Outcome: Not Progressing Goal: Will not experience complications related to urinary retention Outcome: Not Progressing   Problem: Pain Managment: Goal: General experience of comfort will improve and/or be controlled Outcome: Not Progressing   Problem: Safety: Goal: Ability to remain free from injury will improve Outcome: Not Progressing   Problem: Skin Integrity: Goal: Risk for impaired skin integrity will decrease Outcome: Not Progressing   Problem: Education: Goal: Knowledge of the prescribed therapeutic regimen will improve Outcome: Not Progressing   Problem: Coping: Goal: Ability to identify and develop effective coping behavior will improve Outcome: Not Progressing   Problem: Clinical Measurements: Goal: Quality of life will improve Outcome: Not Progressing   Problem: Respiratory: Goal: Verbalizations of increased ease of respirations will increase Outcome: Not Progressing   Problem: Role Relationship: Goal: Family's ability to cope with current situation will improve Outcome: Not Progressing Goal: Ability to verbalize concerns, feelings, and thoughts to partner or family member will improve Outcome: Not Progressing   Problem: Pain Management: Goal: Satisfaction with pain management regimen will improve Outcome: Not Progressing   Problem: Education: Goal: Knowledge of the prescribed therapeutic regimen will  improve Outcome: Not Progressing   Problem: Coping: Goal: Ability to identify and develop effective coping behavior will improve Outcome: Not Progressing   Problem: Clinical Measurements: Goal: Quality of life will improve Outcome: Not Progressing   Problem: Respiratory: Goal: Verbalizations of increased ease of respirations will increase Outcome: Not Progressing   Problem: Role Relationship: Goal: Family's ability to cope with current situation will improve Outcome: Not Progressing Goal: Ability to verbalize concerns, feelings, and  thoughts to partner or family member will improve Outcome: Not Progressing   Problem: Pain Management: Goal: Satisfaction with pain management regimen will improve Outcome: Not Progressing

## 2024-02-25 NOTE — Progress Notes (Addendum)
 Palliative:  HPI: 63 y.o. male with past medical history of lumbar decompression admitted on 02/14/2024 with altered speech and behavior and poor appetite. 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    Reviewed records and noted biopsy results as well as worsening stroke with worsening symptoms. Noted conversations with attending team and my colleague and decision for DNR and comfort care. Agree with transition to [facility] given decline. Awaiting hospice evaluation.   I met today with Riley Wright along with his uncle at bedside. Uncle is up to date with situation and expresses awareness of decision to pursue hospice. I shared that we are still waiting for official evaluation. Riley Wright is awake and alert. His voice is more slurred and clear facial droop. He often blinks and squeezes his eyes shut so potential changes in eyesight as well. Poor intake. Sleeping a lot per family.   All questions/concerns addressed. Emotional support provided. I am available to family as needed - they have my contact information.   Exam: Alert, confused. Slurred speech. Tired. No distress. Breathing regular, unlabored. Abd soft.   Plan: - DNR/DNI - Comfort care - Hopeful for [facility] placement - No changes made to comfort meds  25 min   Bernarda Kitty, NP Palliative Medicine Team Pager [phone] (Please see [email or web address] for schedule) Team Phone [phone]

## 2024-02-25 NOTE — TOC Progression Note (Signed)
 Transition of Care [facility]) - Progression Note    Patient Details  Name: Riley Wright MRN: [id number] Date of Birth: December 19, 1960  [facility] CM/SW Contact  Almarie CHRISTELLA Goodie, [place] Phone Number: 02/25/2024, 2:05 PM  Clinical Narrative:   CSW notified by hospice liaison that patient has been approved for [facility], but no bed is available at this time. Update provided to MD. Will continue to follow.    Expected Discharge Plan: Hospice Medical Facility Barriers to Discharge: Continued Medical Work up, English As A Second Language Teacher, Hospice Bed not available               Expected Discharge Plan and Services     Post Acute Care Choice: Skilled Nursing Facility Living arrangements for the past 2 months: Single Family Home                                       Social Drivers of Health (SDOH) Interventions SDOH Screenings   Food Insecurity: Low Risk  (09/17/2022)   Received from [facility]  Housing: Low Risk  (09/17/2022)   Received from [facility]  Transportation Needs: No Transportation Needs (09/17/2022)   Received from [facility]  Utilities: Low Risk  (09/17/2022)   Received from [facility]  Tobacco Use: Medium Risk (02/14/2024)    Readmission Risk Interventions     No data to display

## 2024-02-26 DIAGNOSIS — I82403 Acute embolism and thrombosis of unspecified deep veins of lower extremity, bilateral: Secondary | ICD-10-CM

## 2024-02-26 DIAGNOSIS — C259 Malignant neoplasm of pancreas, unspecified: Secondary | ICD-10-CM

## 2024-02-26 DIAGNOSIS — C787 Secondary malignant neoplasm of liver and intrahepatic bile duct: Secondary | ICD-10-CM | POA: Diagnosis not present

## 2024-02-26 DIAGNOSIS — R40243 Glasgow coma scale score 3-8, unspecified time: Secondary | ICD-10-CM

## 2024-02-26 DIAGNOSIS — I639 Cerebral infarction, unspecified: Secondary | ICD-10-CM | POA: Diagnosis not present

## 2024-02-26 DIAGNOSIS — Z515 Encounter for palliative care: Secondary | ICD-10-CM

## 2024-02-26 DIAGNOSIS — R930 Abnormal findings on diagnostic imaging of skull and head, not elsewhere classified: Secondary | ICD-10-CM

## 2024-02-26 NOTE — Progress Notes (Signed)
 Subjective: Riley Wright is a 63 y.o. 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 Pt was placed under IVC on 11/2.   Interval update: 11/12 There were no acute events overnight. Vital signs have been stable. Patient is on comfort cares. He was resting this morning watching TV. He said that he was little uncomfortable, and multilple attempts were made by the rounding team and son [name] who was at bedside to try to better understand what was uncomfortable. We adjusted his bed. He denied being in any pain. Denied fever, SOB, chest pain, urinary concerns. All of son's question's were answered.   Pt has been accepted by [facility], but they do not currently have a bed available. Will notify TOC when a bed becomes available.  Objective:  Vital signs in last 24 hours: Vitals:   02/24/24 1122 02/24/24 2003 02/25/24 0806 02/26/24 0755  BP: 130/87 127/88 126/83 123/89  Pulse: 80 88 87 88  Resp: 16  16 16   Temp: (!) 97.3 F (36.3 C) 98.3 F (36.8 C) (!) 97.5 F (36.4 C) 98.4 F (36.9 C)  TempSrc: Oral Oral Oral Oral  SpO2: 100% 98% 96% 97%  Weight:      Height:       Physical Exam Vitals and nursing note reviewed. Exam conducted with a chaperone present.  Constitutional:      General: He is not in acute distress.    Appearance: He is normal weight.  Pulmonary:     Effort: Pulmonary effort is normal. No respiratory distress.  Genitourinary:    Comments: Condom cath applied Skin:    General: Skin is warm and dry.  Neurological:     Mental Status: He is alert.       Latest Ref Rng & Units 02/24/2024    2:36 AM 02/23/2024    1:09 AM 02/22/2024    2:42 AM  CBC  WBC 4.0 - 10.5 K/uL 11.2  12.4  11.2   Hemoglobin 13.0 - 17.0 g/dL 87.2  87.8  87.7   Hematocrit 39.0 - 52.0 % 39.2  38.3  37.9   Platelets 150 -  400 K/uL 146  191  193         Latest Ref Rng & Units 02/21/2024    5:19 AM 02/20/2024    2:39 AM 02/17/2024    2:27 AM  BMP  Glucose 70 - 99 mg/dL 891  864  88   BUN 8 - 23 mg/dL 10  10  12    Creatinine 0.61 - 1.24 mg/dL 9.00  9.07  8.96   Sodium 135 - 145 mmol/L 139  139  137   Potassium 3.5 - 5.1 mmol/L 4.1  3.7  3.7   Chloride 98 - 111 mmol/L 102  101  98   CO2 22 - 32 mmol/L 26  26  24    Calcium 8.9 - 10.3 mg/dL 9.0  8.8  8.5    Assessment/Plan:  Principal Problem:   Metastatic cancer (HCC) Active Problems:   Acute CVA (cerebrovascular accident) (HCC)   Elevated ALT measurement   Elevated AST (SGOT)   Leukocytosis   Liver masses   Mass of pancreas   Pulmonary embolus (HCC)   DVT (deep venous thrombosis) (HCC)   Elevated alkaline phosphatase level   Altered mental status  #AMS Pt required code stroke for  temporary altered mental status of GCS 4 and somnolence. Multiple infarcts noted on MR Brain on 11/1 and 10/31. There had not been any improvement, and pt is now on comfort cares.  - Neurology has signed off. - delirium precautions   #Metastatic cancer, suspected pancreatic as primary source #Elevated AST, ALT, Alkaline phosphatase  #Metastatic masses in the liver  Liver biopsy confirms this is metastatic pancreatic cancer. Plan: -Hgb is stable post op, no signs of brisk bleeding and abdominal exam benign  -Palliative care consulted, pt on comfort cares.  - Pt on comfort cares as directed by Palliative team  #Metastatic lesions of the brain vs acute embolic CVAs  Patient initially thought to have embolic CVAs in the brain. Likely metastases per neurology vs embolic strokes. Lesion worsened on 11/10  Plan: - Comfort Cares #PE #DVT  Patient was found to have an incidental PE on chest CT and also found to have bilateral acute deep vein thrombosis in lower extremity. High suspicion for trousseau syndrome.  Plan: - Stopped VTE prophylaxis dt Comfort Cares  status  #Leukocytosis: Stable and likely reactive   Resolved Problems:  #AGMA __________________________________  Code Status: DNR-Comfort Cares VTE Prophylaxis: None Diet: Regular IVF:N/A Barriers to Discharge: Bed availability at Hospice Dispo: As soon as patient has a bed available.  [name], DO 02/26/2024, 9:24 AM Please contact the on call pager at: [phone]

## 2024-02-26 NOTE — Discharge Summary (Addendum)
 Name: Riley Wright MRN: [id number] DOB: 01-Mar-1961 63 y.o. PCP: Patient, No Pcp Per  Date of Admission: 02/14/2024 10:08 AM Date of Discharge: 02/26/2024 Attending Physician: Dr. Reyes Fenton  Discharge Diagnosis: 1. Principal Problem:   Metastatic cancer (HCC) Active Problems:   Acute CVA (cerebrovascular accident) (HCC)   Elevated ALT measurement   Elevated AST (SGOT)   Leukocytosis   Liver masses   Mass of pancreas   Pulmonary embolus (HCC)   DVT (deep venous thrombosis) (HCC)   Elevated alkaline phosphatase level   Altered mental status   Pancreatic cancer metastasized to liver [facility])   Palliative care patient   Discharge Medications: Allergies as of 02/26/2024   No Known Allergies      Medication List    You have not been prescribed any medications.     Mr.Eschol VERSHAWN WESTRUP was discharged from [facility] in Serious condition.    Hospital Course by problem list: PROCTOR CARRIKER is a 63 y.o. person living with no significant past medical history who presented with stroke-like symptoms and admitted for stroke-like symptms now being discharged on hospital day 12 with the following pertinent hospital course:  #Metastatic cancer with likely primary pancreatic source #Transaminitis No history of heavy alcohol use or IVDU per family. Hepatomegaly on physical exam. Elevated transaminases, bilirubin, and ALP. Synthetic function decreased as evidenced by elevated INR and low platelets. Due to hepatomegaly and elevated transaminases, liver ultrasound was ordered which revealed multiple solid-appearing hepatic masses, largest 4.2 cm in the right lobe and 3.6 cm in the left lobe, concerning for metastases. Follow-up CT scan of the chest abdomen and pelvis showed 4.9 x 2.4 cm hypodense mass within the pancreatic body, highly concerning for pancreatic neoplasm with metastasis to the lung and liver. IR consulted for percutaneous liver biopsy, but he had to wait  multiple days prior to biopsy due to recent aspirin use. The biopsy was completed without complication on 11/7, and the pathology report showed metastatic pancreatic cancer. A family meeting with sons [name] and [name] was held with the palliative team, and the patient code status was changed to comfort cares.   #Pulmonary embolus #DVT No increased work of breathing, saturating well on room air. Incidental left-sided pulmonary emboli without evidence of right heart strain found on chest CT for staging of the above neoplasm. Started on heparin drip per neurology after MRI brain without contrast thought to be consistent with metastisis and infarcts with low change of conversion. This was removed once was pt was transitioned to comfort cares.   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 Presented with deficits in comprehension and speech. Mild right sided weakness per neurology. CT showed subacute left thalamus infarct and multiple other infarcts. MRI showed numerous ischemic foci throughout cerebellar and cerebral hemispheres, concerning for embolic source. CTA showed minimal atherosclerotic calcification at the left carotid bifurcation which was not hemodynamically significant. MRI brain with contrast concerning for brain metastisis as well as ischemic infarcts. TTE was not performed as the patient was already on heparin for his PE. During his stay, he showed no improvement in his new symptoms and continued with some expressive aphasia.  #Starvation ketosis #Poor PO intake #Ketonuria Patient had reportedly not been eating a lot for the past week prior to admission. No nausea, vomiting, diarrhea, or other GI symptoms. Anion gap elevated to 17 with low normal bicarbonate  at 22. Ketones in urine. VBG showed pH of 7.41. BHB elevated to 1.63 and lactic acid normal. Was started  on regular diet.  #Leukocytosis  Elevated to 11.8 on admission. Paucity of infectious symptoms. Afebrile and hemodynamically stable. Possible reactive to ischemic emboli or in the setting of his malignancy.   Subjective 02/26/2024 On day of discharge, pt was resting comfortably in bed, there were no acute events overnight, and his vital signs had been stable. He was watching TV and communicated with staff that he wanted the head of his bed raised a little, which was done. Denied being in any pain. Denied fever, SOB, chest pain, urinary concerns. All of sons's questions were answered during rounds that morning.  Discharge Exam:   BP 123/89 (BP Location: Left Arm)   Pulse 88   Temp 98.4 F (36.9 C) (Oral)   Resp 16   Ht 6' 2 (1.88 m)   Wt 94.2 kg   SpO2 97%   BMI 26.65 kg/m   Physical Exam Vitals and nursing note reviewed. Exam conducted with a chaperone present.  Constitutional:      General: He is not in acute distress.    Appearance: He is normal weight.  Pulmonary:     Effort: Pulmonary effort is normal. No respiratory distress.  Genitourinary:    Comments: Condom cath applied Skin:    General: Skin is warm and dry.  Neurological:     Mental Status: He is alert.   Pertinent Labs, Studies, and Procedures:     Latest Ref Rng & Units 02/24/2024    2:36 AM 02/23/2024    1:09 AM 02/22/2024    2:42 AM  CBC  WBC 4.0 - 10.5 K/uL 11.2  12.4  11.2   Hemoglobin 13.0 - 17.0 g/dL 87.2  87.8  87.7   Hematocrit 39.0 - 52.0 % 39.2  38.3  37.9   Platelets 150 - 400 K/uL 146  191  193        Latest Ref Rng & Units 02/21/2024    5:19 AM 02/20/2024    2:39 AM 02/17/2024    2:27 AM  CMP  Glucose 70 - 99 mg/dL 891  864  88   BUN 8 - 23 mg/dL 10  10  12    Creatinine 0.61 - 1.24 mg/dL 9.00  9.07  8.96   Sodium 135 - 145 mmol/L 139  139  137   Potassium 3.5 - 5.1 mmol/L 4.1  3.7  3.7   Chloride 98 - 111 mmol/L 102  101  98   CO2 22 - 32 mmol/L 26  26  24    Calcium 8.9 - 10.3 mg/dL  9.0  8.8  8.5   Total Protein 6.5 - 8.1 g/dL 6.4   5.6   Total Bilirubin 0.0 - 1.2 mg/dL 0.9   1.8   Alkaline Phos 38 - 126 U/L 160   172   AST 15 - 41 U/L 77   73   ALT 0 - 44 U/L 99   74     VAS US  LOWER EXTREMITY VENOUS (DVT) Result Date: 02/16/2024  Lower Venous DVT Study Patient Name:  AQUILLA VOILES  Date of Exam:   02/15/2024 Medical Rec #: [id number]      Accession #:    [id number] Date of Birth: 04-07-1961      Patient Gender: M Patient Age:   56 years Exam Location:  [facility] Procedure:      VAS US  LOWER  EXTREMITY VENOUS (DVT) Referring Phys: DEVON SHAFER --------------------------------------------------------------------------------  Indications: Embolic stroke vs. brain metasstases, left sided pulmonary embolism in the setting of new, incidental finding of large pancreatic mass concerning for neoplasm and several hepatic lesions concerning for metastases.  Comparison Study: No prior study Performing Technologist: [name] RVS  Examination Guidelines: A complete evaluation includes B-mode imaging, spectral Doppler, color Doppler, and power Doppler as needed of all accessible portions of each vessel. Bilateral testing is considered an integral part of a complete examination. Limited examinations for reoccurring indications may be performed as noted. The reflux portion of the exam is performed with the patient in reverse Trendelenburg.  +---------+---------------+---------+-----------+----------+-------------------+ RIGHT    CompressibilityPhasicitySpontaneityPropertiesThrombus Aging      +---------+---------------+---------+-----------+----------+-------------------+ CFV      Full           Yes      Yes                                      +---------+---------------+---------+-----------+----------+-------------------+ SFJ      Full                                                              +---------+---------------+---------+-----------+----------+-------------------+ FV Prox  Full           Yes      No                                       +---------+---------------+---------+-----------+----------+-------------------+ FV Mid   Full           Yes      Yes                                      +---------+---------------+---------+-----------+----------+-------------------+ FV DistalFull                                                             +---------+---------------+---------+-----------+----------+-------------------+ PFV      Full                                                             +---------+---------------+---------+-----------+----------+-------------------+ POP      Full           Yes      Yes                  patent by color and  Doppler             +---------+---------------+---------+-----------+----------+-------------------+ PTV      None                                         Acute               +---------+---------------+---------+-----------+----------+-------------------+ PERO     None                                         Acute               +---------+---------------+---------+-----------+----------+-------------------+   +---------+---------------+---------+-----------+----------+--------------+ LEFT     CompressibilityPhasicitySpontaneityPropertiesThrombus Aging +---------+---------------+---------+-----------+----------+--------------+ CFV      Full           Yes      Yes                                 +---------+---------------+---------+-----------+----------+--------------+ SFJ      Full                                                        +---------+---------------+---------+-----------+----------+--------------+ FV Prox  Full                                                         +---------+---------------+---------+-----------+----------+--------------+ FV Mid   Full           Yes      Yes                                 +---------+---------------+---------+-----------+----------+--------------+ FV DistalFull                                                        +---------+---------------+---------+-----------+----------+--------------+ PFV      Full                                                        +---------+---------------+---------+-----------+----------+--------------+ POP      Full           Yes      Yes                                 +---------+---------------+---------+-----------+----------+--------------+ PTV      None  Acute          +---------+---------------+---------+-----------+----------+--------------+ PERO     None                                         Acute          +---------+---------------+---------+-----------+----------+--------------+     Summary: RIGHT: - Findings consistent with acute deep vein thrombosis involving the right posterior tibial veins, and right peroneal veins.  - No cystic structure found in the popliteal fossa.  LEFT: - Findings consistent with acute deep vein thrombosis involving the left posterior tibial veins, and left peroneal veins.  - No cystic structure found in the popliteal fossa.  *See table(s) above for measurements and observations. Electronically signed by Norman Serve on 02/16/2024 at 8:33:35 AM.    Final    ECHOCARDIOGRAM COMPLETE BUBBLE STUDY Result Date: 02/15/2024    ECHOCARDIOGRAM REPORT   Patient Name:   ALAA EYERMAN Date of Exam: 02/15/2024 Medical Rec #:  [id number]     Height:       74.0 in Accession #:    [id number]    Weight:       207.6 lb Date of Birth:  June 28, 1960     BSA:          2.208 m Patient Age:    63 years      BP:           138/88 mmHg Patient Gender: M             HR:           72 bpm. Exam Location:  Inpatient Procedure:  2D Echo, Cardiac Doppler, Color Doppler and Saline Contrast Bubble            Study (Both Spectral and Color Flow Doppler were utilized during            procedure). Indications:    Stroke  History:        Patient has no prior history of Echocardiogram examinations.  Sonographer:    Philomena Daring Referring Phys: DEVON SHAFER IMPRESSIONS  1. Left ventricular ejection fraction, by estimation, is 60 to 65%. The left ventricle has normal function. The left ventricle has no regional wall motion abnormalities. There is mild left ventricular hypertrophy. Left ventricular diastolic parameters are consistent with Grade I diastolic dysfunction (impaired relaxation).  2. Right ventricular systolic function is normal. The right ventricular size is normal. Tricuspid regurgitation signal is inadequate for assessing PA pressure.  3. A small pericardial effusion is present. The pericardial effusion is circumferential.  4. The mitral valve is normal in structure. No evidence of mitral valve regurgitation. No evidence of mitral stenosis.  5. The aortic valve is tricuspid. Aortic valve regurgitation is not visualized. No aortic stenosis is present.  6. Agitated saline contrast bubble study was negative, with no evidence of any interatrial shunt. Comparison(s): No prior Echocardiogram. FINDINGS  Left Ventricle: Left ventricular ejection fraction, by estimation, is 60 to 65%. The left ventricle has normal function. The left ventricle has no regional wall motion abnormalities. Strain was performed and the global longitudinal strain is indeterminate. The left ventricular internal cavity size was normal in size. There is mild left ventricular hypertrophy. Left ventricular diastolic parameters are consistent with Grade I diastolic dysfunction (impaired relaxation). Right Ventricle: The right ventricular size is normal. No increase in right ventricular wall thickness. Right ventricular  systolic function is normal. Tricuspid regurgitation  signal is inadequate for assessing PA pressure. Left Atrium: Left atrial size was normal in size. Right Atrium: Right atrial size was normal in size. Pericardium: A small pericardial effusion is present. The pericardial effusion is circumferential. Mitral Valve: The mitral valve is normal in structure. No evidence of mitral valve regurgitation. No evidence of mitral valve stenosis. Tricuspid Valve: The tricuspid valve is normal in structure. Tricuspid valve regurgitation is not demonstrated. No evidence of tricuspid stenosis. Aortic Valve: The aortic valve is tricuspid. Aortic valve regurgitation is not visualized. No aortic stenosis is present. Pulmonic Valve: The pulmonic valve was not well visualized. Pulmonic valve regurgitation is trivial. No evidence of pulmonic stenosis. Aorta: The aortic root is normal in size and structure. Venous: The inferior vena cava was not well visualized. IAS/Shunts: No atrial level shunt detected by color flow Doppler. Agitated saline contrast was given intravenously to evaluate for intracardiac shunting. Agitated saline contrast bubble study was negative, with no evidence of any interatrial shunt. Additional Comments: 3D was performed not requiring image post processing on an independent workstation and was indeterminate.  LEFT VENTRICLE PLAX 2D LVIDd:         3.80 cm   Diastology LVIDs:         2.40 cm   LV e' medial:    6.42 cm/s LV PW:         1.20 cm   LV E/e' medial:  10.9 LV IVS:        1.20 cm   LV e' lateral:   9.68 cm/s LVOT diam:     2.20 cm   LV E/e' lateral: 7.2 LV SV:         77 LV SV Index:   35 LVOT Area:     3.80 cm  RIGHT VENTRICLE             IVC RV S prime:     17.30 cm/s  IVC diam: 1.20 cm TAPSE (M-mode): 2.6 cm LEFT ATRIUM             Index        RIGHT ATRIUM           Index LA diam:        3.20 cm 1.45 cm/m   RA Area:     12.90 cm LA Vol (A2C):   30.7 ml 13.90 ml/m  RA Volume:   30.60 ml  13.86 ml/m LA Vol (A4C):   34.3 ml 15.53 ml/m LA Biplane Vol:  33.7 ml 15.26 ml/m  AORTIC VALVE LVOT Vmax:   110.00 cm/s LVOT Vmean:  70.400 cm/s LVOT VTI:    0.202 m  AORTA Ao Root diam: 2.90 cm Ao Asc diam:  3.10 cm MITRAL VALVE MV Area (PHT): 3.53 cm    SHUNTS MV Decel Time: 215 msec    Systemic VTI:  0.20 m MV E velocity: 69.80 cm/s  Systemic Diam: 2.20 cm MV A velocity: 80.90 cm/s MV E/A ratio:  0.86 Vishnu Priya Mallipeddi Electronically signed by Diannah Late Mallipeddi Signature Date/Time: 02/15/2024/1:22:06 PM    Final    MR BRAIN W CONTRAST Result Date: 02/15/2024 EXAM: MR Brain with Intravenous Contrast. CLINICAL HISTORY: r/o brain mets. TECHNIQUE: Magnetic resonance images of the brain with intravenous contrast in multiple planes. CONTRAST: With; 9.4 mL gadobutrol (GADAVIST) 1 MMOL/ML injection. COMPARISON: MRI of the head dated 02/14/2024. FINDINGS: BRAIN: No restricted diffusion to indicate acute infarction. Numerous nodular and linear foci of contrast  enhancement present within the cerebral cortex and subcortical white matter bilaterally, including on images 29, 31, 33, 34, 36, 37, 44, 45, 47, and 49 of series 4. There are no ring enhancing lesions. There are numerous areas of ischemia with no associated contrast enhancement. There is some enhancement along the periphery of the lacunar infarct within the left thalamus. No intracranial mass or hemorrhage. No midline shift or extra-axial fluid collection. No cerebellar tonsillar ectopia. The central arterial and venous flow voids are patent. VENTRICLES: No hydrocephalus. ORBITS: The orbits are normal. SINUSES AND MASTOIDS: The sinuses and mastoid air cells are clear. BONES: No acute fracture or focal osseous lesion. IMPRESSION: 1. Numerous nodular and linear foci of contrast enhancement within the cerebral cortex and subcortical white matter bilaterally, without ring enhancement. It is not clear whether this represents luxury perfusion from ischemia. There are multiple areas of infarction that demonstrated no  associated enhancement. Recommend follow up MRI of the brain without and with gadolinium contrast in approximately 1 week. 2. Numerous areas of ischemia without associated contrast enhancement. 3. Enhancement along the periphery of the lacunar infarct within the left thalamus. Electronically signed by: Evalene Coho MD 02/15/2024 10:42 AM EDT RP Workstation: [id number]   CT HEAD WO CONTRAST ( ) Result Date: 02/15/2024 EXAM: CT HEAD WITHOUT CONTRAST 02/15/2024 09:26:05 AM TECHNIQUE: CT of the head was performed without the administration of intravenous contrast. Automated exposure control, iterative reconstruction, and/or weight based adjustment of the mA/kV was utilized to reduce the radiation dose to as low as reasonably achievable. COMPARISON: 02/14/2024 CLINICAL HISTORY: Follow up Stroke, looking for any bleeding before starting Heparin. FINDINGS: BRAIN AND VENTRICLES: No acute hemorrhage. 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 No hydrocephalus. No extra-axial collection. No mass effect or midline shift. ORBITS: No acute abnormality. SINUSES: No acute abnormality. SOFT TISSUES AND SKULL: No acute soft tissue abnormality. No skull fracture. IMPRESSION: 1. 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 2. Unchanged left cerebellar infarcts. Electronically signed by: Evalene Coho MD 02/15/2024 10:28 AM EDT RP Workstation: [id number]   CT CHEST ABDOMEN PELVIS W CONTRAST Result Date: 02/14/2024 CLINICAL DATA:  Concern for metastatic disease, multiple hepatic masses on ultrasound EXAM: CT CHEST, ABDOMEN, AND PELVIS WITH CONTRAST TECHNIQUE: Multidetector CT imaging of the chest, abdomen and pelvis was performed following the standard protocol during bolus administration of intravenous contrast. RADIATION DOSE REDUCTION: This exam was performed according to the departmental dose-optimization program which includes  automated exposure control, adjustment of the mA and/or kV according to patient size and/or use of iterative reconstruction technique. CONTRAST:  75mL OMNIPAQUE  IOHEXOL  350 MG/ML SOLN COMPARISON:  02/14/2024, 10/27/2019 FINDINGS: CT CHEST FINDINGS Cardiovascular: The heart is unremarkable without pericardial effusion. No evidence of thoracic aortic aneurysm or dissection. While not optimized for opacification of the pulmonary vasculature, filling defects are seen within the left upper and left lower lobe pulmonary arteries compatible with pulmonary emboli. Mediastinum/Nodes: No enlarged mediastinal, hilar, or axillary lymph nodes. Thyroid gland, trachea, and esophagus demonstrate no significant findings. Lungs/Pleura: Trace bilateral pleural effusions. There are innumerable bilateral subcentimeter pulmonary nodules concerning for metastatic disease given findings on previous ultrasound. Index nodule in the left upper lobe reference image 59/5 measures 5 mm. No pneumothorax. The central airways are patent. Musculoskeletal: There are no acute or destructive bony abnormalities. Prior healed left rib fractures. Reconstructed images demonstrate no additional findings. CT ABDOMEN PELVIS FINDINGS Hepatobiliary: Innumerable hypodense masses are seen throughout the liver compatible with metastatic disease. Index lesion in  the left lobe measures 4.6 x 4.8 cm reference image 42/3. Gallbladder is decompressed without evidence of cholelithiasis or cholecystitis. No biliary duct dilation. Pancreas: Ill-defined hypodense mass within the pancreatic body measuring 4.9 x 2.4 cm, highly concerning for pancreatic neoplasm. No acute inflammatory changes. Spleen: Wedge-shaped peripheral hypodensities are seen within the spleen, the largest of which persists on delayed imaging, which could reflect splenic infarcts. No evidence of splenomegaly. Adrenals/Urinary Tract: Multiple simple appearing bilateral renal cysts, largest in the upper  pole right kidney measuring 8.1 cm. No specific imaging follow-up is recommended. No obstructive uropathy within either kidney. Excreted contrast from previous CT within the bladder lumen, with no evidence of filling defect. The adrenals are unremarkable. Stomach/Bowel: No bowel obstruction or ileus. Normal appendix right lower quadrant. No bowel wall thickening or inflammatory change. Vascular/Lymphatic: No discrete adenopathy within the abdomen or pelvis. No significant vascular findings. Reproductive: Prostate is enlarged, with hypertrophied median lobe resulting in mass effect upon the inferior wall the bladder. Other: No free fluid or free intraperitoneal gas. No abdominal wall hernia. Musculoskeletal: No acute or destructive bony abnormalities. Reconstructed images demonstrate no additional findings. IMPRESSION: 1. Incidental left-sided pulmonary emboli. No evidence of right heart strain. 2. 4.9 x 2.4 cm hypodense mass within the pancreatic body, highly concerning for pancreatic neoplasm. 3. Innumerable hypodense masses throughout the liver consistent with hepatic metastases. 4. Innumerable subcentimeter bilateral pulmonary nodules consistent with pulmonary metastases. Largest index nodule in the left upper lobe measures 5 mm. 5. Peripheral wedge-shaped hypodensities within the spleen, which could reflect splenic infarcts. 6. Trace bilateral pleural effusions. 7. Enlarged prostate. Critical Value/emergent results were called by telephone at the time of interpretation on 02/14/2024 at 8:10 pm to provider [facility] , who verbally acknowledged these results. Electronically Signed   By: Ozell Daring M.D.   On: 02/14/2024 20:16   US  Abdomen Limited RUQ (LIVER/GB) Result Date: 02/14/2024 EXAM: Right Upper Quadrant Abdominal Ultrasound 02/14/2024 05:10:53 PM TECHNIQUE: Real-time ultrasonography of the right upper quadrant of the abdomen was performed. COMPARISON: None available. CLINICAL HISTORY: Elevated  AST (SGOT) [id number]; [id number] Elevated ALT measurement [id number]. FINDINGS: LIVER: Multiple solid-appearing hepatic masses, the largest in the right hepatic lobe measuring up to 4.2 cm and the largest in the left hepatic lobe measuring 3.6 cm. Findings concerning for metastases. No intrahepatic biliary ductal dilatation. BILIARY SYSTEM: Gallbladder not definitively visualized. Common bile duct is within normal limits measuring . RIGHT KIDNEY: The right kidney is grossly unremarkable in appearances without evidence of hydronephrosis, echogenic calculi or worrisome mass lesions. PANCREAS: Visualized portions of the pancreas are unremarkable. OTHER: No right upper quadrant ascites. IMPRESSION: 1. Multiple solid-appearing hepatic masses, largest 4.2 cm in the right lobe and 3.6 cm in the left lobe, concerning for metastases. . 2. Gallbladder not definitively visualized. Electronically signed by: Franky Crease MD 02/14/2024 05:27 PM EDT RP Workstation: [id number]   CT ANGIO HEAD NECK W WO CM Result Date: 02/14/2024 EXAM: CTA HEAD AND NECK WITH CONTRAST 02/14/2024 01:34:00 PM TECHNIQUE: CTA of the head and neck was performed with the administration of 75 mL of iohexol  (OMNIPAQUE ) 350 MG/ML injection. Multiplanar 2D and/or 3D reformatted images are provided for review. Automated exposure control, iterative reconstruction, and/or weight based adjustment of the mA/kV was utilized to reduce the radiation dose to as low as reasonably achievable. Stenosis of the internal carotid arteries measured using NASCET criteria. COMPARISON: brain MRI 02/14/2024. CLINICAL HISTORY: Stroke/TIA, determine embolic source. FINDINGS: CTA NECK: AORTIC ARCH AND ARCH VESSELS: No  dissection or arterial injury. No significant stenosis of the brachiocephalic or subclavian arteries. CERVICAL CAROTID ARTERIES: Minimal atherosclerotic calcification at the left carotid bifurcation. No hemodynamically significant stenosis of either carotid system by NASCET  criteria. No dissection or arterial injury. CERVICAL VERTEBRAL ARTERIES: Vertebral arteries are right dominant. No dissection, arterial injury, or significant stenosis. LUNGS AND MEDIASTINUM: Unremarkable. SOFT TISSUES: No acute abnormality. BONES: No acute abnormality. CTA HEAD: ANTERIOR CIRCULATION: No significant stenosis of the internal carotid arteries. No significant stenosis of the anterior cerebral arteries. No significant stenosis of the middle cerebral arteries. No aneurysm. POSTERIOR CIRCULATION: No significant stenosis of the posterior cerebral arteries. No significant stenosis of the basilar artery. No significant stenosis of the vertebral arteries. No aneurysm. OTHER: No dural venous sinus thrombosis on this non-dedicated study. IMPRESSION: 1. No large vessel occlusion, hemodynamically significant stenosis, or aneurysm in the head or neck. 2. Minimal atherosclerotic calcification at the left carotid bifurcation without hemodynamically significant stenosis. 3. Essentially no atherosclerotic disease of the visualized craniocervical arteries consider cardiac source/arrhythmia as a potential source of embolism infarcts. Correlation with echocardiogram suggested. Electronically signed by: Franky Stanford MD 02/14/2024 02:42 PM EDT RP Workstation: [id number]   MR BRAIN WO CONTRAST Result Date: 02/14/2024 EXAM: MRI BRAIN WITHOUT CONTRAST 02/14/2024 01:03:36 PM TECHNIQUE: Multiplanar multisequence MRI of the head/brain was performed without the administration of intravenous contrast. COMPARISON: None available. CLINICAL HISTORY: FINDINGS: BRAIN AND VENTRICLES: There are numerous foci of acute ischemia throughout both cerebral hemispheres and cerebellar hemispheres. The largest lesion is in the left thalamus. The burden of lesions is greater on the left than the right. Scattered chronic microhemorrhages in the peripheral cerebral hemispheres. Multifocal hyperintense T2-weighted signal within the cerebral white  matter, most commonly due to chronic small vessel disease. No mass. No midline shift. No hydrocephalus. The sella is unremarkable. Normal flow voids. ORBITS: No acute abnormality. SINUSES AND MASTOIDS: No acute abnormality. BONES AND SOFT TISSUES: Normal marrow signal. No acute soft tissue abnormality. IMPRESSION: 1. Numerous acute ischemic foci throughout both cerebral and cerebellar hemispheres, with greater burden on the left. This is consistent with an embolic process from a cardiac or proximal aortic source. 2. Multifocal cerebral white matter T2 hyperintensities consistent with chronic small vessel disease. Electronically signed by: Franky Stanford MD 02/14/2024 02:38 PM EDT RP Workstation: [id number]   CT HEAD WO CONTRAST Result Date: 02/14/2024 EXAM: CT HEAD WITHOUT CONTRAST 02/14/2024 11:16:00 AM TECHNIQUE: CT of the head was performed without the administration of intravenous contrast. Automated exposure control, iterative reconstruction, and/or weight based adjustment of the mA/kV was utilized to reduce the radiation dose to as low as reasonably achievable. COMPARISON: None available. CLINICAL HISTORY: Memory loss. FINDINGS: BRAIN AND VENTRICLES: No acute hemorrhage. There is an ovoid area of diminished attenuation present anteriorly within the left thalamus concerning for subacute ischemic infarct. There is also a focal area of diminished attenuation in the subcortical white matter of the left posterior temporal lobe seen on image 16 of series 3, which is concerning for subcortical infarct. There are also multiple areas of age indeterminate infarction within the cerebellar hemispheres bilaterally, worse on the left. There is no significant cerebral or cerebellar swelling. There is mild cerebral white matter disease. No hydrocephalus. No extra-axial collection. No mass effect or midline shift. ORBITS: No acute abnormality. SINUSES: No acute abnormality. SOFT TISSUES AND SKULL: No acute soft tissue  abnormality. No skull fracture. IMPRESSION: 1. Ovoid area of diminished attenuation in the left thalamus, concerning for subacute ischemic infarct. 2. Focal  area of diminished attenuation in the subcortical white matter of the left posterior temporal lobe, concerning for subcortical infarct. 3. Multiple areas of age indeterminate infarction within the cerebellar hemispheres bilaterally, worse on the left. 4. Correlation with MRI of the brain and CT angiogram of the head and neck is suggested. Electronically signed by: Evalene Coho MD 02/14/2024 11:29 AM EDT RP Workstation: [id number]     Signed: [name], DO 02/26/2024, 1:31 PM

## 2024-02-26 NOTE — Progress Notes (Signed)
 Patient leaving ton [facility] via [place].  vital signs stable.  IV insitu.  Pr

## 2024-02-26 NOTE — TOC Transition Note (Signed)
 Transition of Care [facility]) - Discharge Note   Patient Details  Name: Riley Wright MRN: [id number] Date of Birth: [birth year]  [facility]) CM/SW Contact:  Almarie CHRISTELLA Goodie, LCSW Phone Number: 02/26/2024, 4:26 PM   Clinical Narrative:   CSW updated by [place] representative that patient has a bed at [facility] today, family in agreement with transition. CSW met with son, Riley Wright, who was asking about how to manage the patient's assets, asking about what to do with guardianship. CSW provided information on guardianship application, but also looked up information on filing for executor of estate and provided that, as well. CSW also discussed with [facility] liaison if they had any additional resources to provide the family. Son adult nurse of information. CSW notified by [facility] that transport can be arranged, transport setup with [facility] for next available.   Nurse to call report to [phone].    Final next level of care: Hospice Medical Facility Barriers to Discharge: Barriers Resolved   Patient Goals and CMS Choice Patient states their goals for this hospitalization and ongoing recovery are:: patient unable to participate in goal setting, not fully oriented CMS Medicare.gov Compare Post Acute Care list provided to:: Patient Represenative (must comment) Choice offered to / list presented to : Adult Children [place] ownership interest in [facility].provided to:: Adult Children    Discharge Placement              Patient chooses bed at:  [facility]) Patient to be transferred to facility by: [facility] Name of family member notified: Riley Wright Patient and family notified of of transfer: 02/26/24  Discharge Plan and Services Additional resources added to the After Visit Summary for       Post Acute Care Choice: Skilled Nursing Facility                               Social Drivers of Health (SDOH) Interventions SDOH Screenings   Food  Insecurity: Low Risk  (09/17/2022)   Received from [facility]  Housing: Low Risk  (09/17/2022)   Received from [facility]  Transportation Needs: No Transportation Needs (09/17/2022)   Received from [facility]  Utilities: Low Risk  (09/17/2022)   Received from [facility]  Tobacco Use: Medium Risk (02/14/2024)     Readmission Risk Interventions     No data to display

## 2024-02-26 NOTE — Progress Notes (Signed)
 Palliative:  HPI: 63 y.o. male with past medical history of lumbar decompression admitted on 02/14/2024 with altered speech and behavior and poor appetite. 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 Transitioned to full comfort care.   I met today at Mr. Mittelstaedt bedside along son, [name]. Mr. Bozzi is present and does respond verbally. Voice is slurred. Eyes are heavy and he endorses feeling sleepy. [name] confirms plans for hospice placement and comfort care. Awaiting hospice bed. [name] has questions about finances. Unfortunately I do not have a lot of answers for him as finances are outside my scope of expertise. He is interested in pursuing emergency legal guardianship to assist with access to funds for his father's care. He requests further guidance from CSW on steps to pursue - he reports that he has discussed this in brief with CSW previously. [name] is also trying to manage moving as they have to vacate the residence he and his father were living prior to the end of the month. He has a lot on his plate. I am hopeful that hospice can continue to provide guidance and support for him at [facility].   All questions/concerns addressed. Emotional support provided. Discussed with CSW.   Exam: Alert, confused. Slurred speech. Tired. No distress. Breathing regular, unlabored. Abd soft.    Plan: - DNR/DNI - Comfort care - Hopeful for [facility] placement - No changes made to comfort meds  35 min  Bernarda Kitty, NP Palliative Medicine Team Pager [phone] (Please see [email or web address] for schedule) Team Phone [phone]

## 2024-02-26 NOTE — Progress Notes (Signed)
 [facility] [phone] [facility] Liaison Note  Family agreeable to transfer to [facility] today. TOC aware. RN please call report to [phone] prior to patient leaving the unit. Please send signed DNR with patient at discharge.   Thank you for allowing us  to participate in this patient's care.  Eleanor Nail, LPN [facility] Liaison [phone]

## 2024-02-26 NOTE — Progress Notes (Signed)
 Patient going to [facility]. Report given to [facility], RN

## 2024-04-16 DEATH — deceased
